# Patient Record
Sex: Female | Born: 1935
Health system: Southern US, Community
[De-identification: ages and names within clinical notes are randomized; demographics above are authoritative.]

## PROBLEM LIST (undated history)

## (undated) DIAGNOSIS — I10 Essential (primary) hypertension: Secondary | ICD-10-CM

## (undated) DIAGNOSIS — K222 Esophageal obstruction: Secondary | ICD-10-CM

## (undated) DIAGNOSIS — G459 Transient cerebral ischemic attack, unspecified: Secondary | ICD-10-CM

## (undated) DIAGNOSIS — F0390 Unspecified dementia without behavioral disturbance: Secondary | ICD-10-CM

## (undated) DIAGNOSIS — N289 Disorder of kidney and ureter, unspecified: Secondary | ICD-10-CM

## (undated) DIAGNOSIS — K219 Gastro-esophageal reflux disease without esophagitis: Secondary | ICD-10-CM

## (undated) DIAGNOSIS — E669 Obesity, unspecified: Secondary | ICD-10-CM

## (undated) DIAGNOSIS — M1990 Unspecified osteoarthritis, unspecified site: Secondary | ICD-10-CM

## (undated) DIAGNOSIS — E039 Hypothyroidism, unspecified: Secondary | ICD-10-CM

## (undated) DIAGNOSIS — M810 Age-related osteoporosis without current pathological fracture: Secondary | ICD-10-CM

## (undated) DIAGNOSIS — M549 Dorsalgia, unspecified: Secondary | ICD-10-CM

## (undated) DIAGNOSIS — K922 Gastrointestinal hemorrhage, unspecified: Secondary | ICD-10-CM

## (undated) DIAGNOSIS — E785 Hyperlipidemia, unspecified: Secondary | ICD-10-CM

## (undated) HISTORY — DX: Transient cerebral ischemic attack, unspecified: G45.9

## (undated) HISTORY — DX: Gastrointestinal hemorrhage, unspecified: K92.2

## (undated) HISTORY — DX: Esophageal obstruction: K22.2

## (undated) HISTORY — DX: Disorder of kidney and ureter, unspecified: N28.9

## (undated) HISTORY — DX: Hypothyroidism, unspecified: E03.9

## (undated) HISTORY — PX: CHOLECYSTECTOMY: SHX55

## (undated) HISTORY — DX: Unspecified osteoarthritis, unspecified site: M19.90

## (undated) HISTORY — DX: Obesity, unspecified: E66.9

## (undated) HISTORY — PX: CARDIAC CATHETERIZATION: SHX172

## (undated) HISTORY — PX: TONSILLECTOMY: SUR1361

## (undated) HISTORY — DX: Hyperlipidemia, unspecified: E78.5

## (undated) HISTORY — DX: Gastro-esophageal reflux disease without esophagitis: K21.9

## (undated) HISTORY — DX: Essential (primary) hypertension: I10

## (undated) HISTORY — DX: Age-related osteoporosis without current pathological fracture: M81.0

## (undated) HISTORY — PX: ABDOMINAL HYSTERECTOMY: SUR658

---

## 2000-07-07 ENCOUNTER — Other Ambulatory Visit: Admission: RE | Admit: 2000-07-07 | Discharge: 2000-07-07 | Payer: Self-pay | Admitting: Obstetrics and Gynecology

## 2000-09-20 ENCOUNTER — Ambulatory Visit (HOSPITAL_COMMUNITY): Admission: RE | Admit: 2000-09-20 | Discharge: 2000-09-20 | Payer: Self-pay | Admitting: Gastroenterology

## 2000-09-20 ENCOUNTER — Encounter (INDEPENDENT_AMBULATORY_CARE_PROVIDER_SITE_OTHER): Payer: Self-pay | Admitting: *Deleted

## 2002-11-12 ENCOUNTER — Encounter: Payer: Self-pay | Admitting: Family Medicine

## 2002-11-12 ENCOUNTER — Ambulatory Visit (HOSPITAL_COMMUNITY): Admission: RE | Admit: 2002-11-12 | Discharge: 2002-11-12 | Payer: Self-pay | Admitting: Family Medicine

## 2003-01-27 ENCOUNTER — Encounter: Payer: Self-pay | Admitting: Surgery

## 2003-01-30 ENCOUNTER — Ambulatory Visit (HOSPITAL_COMMUNITY): Admission: RE | Admit: 2003-01-30 | Discharge: 2003-01-31 | Payer: Self-pay | Admitting: Surgery

## 2003-01-30 ENCOUNTER — Encounter (INDEPENDENT_AMBULATORY_CARE_PROVIDER_SITE_OTHER): Payer: Self-pay | Admitting: Specialist

## 2003-12-16 ENCOUNTER — Observation Stay (HOSPITAL_COMMUNITY): Admission: EM | Admit: 2003-12-16 | Discharge: 2003-12-17 | Payer: Self-pay | Admitting: Emergency Medicine

## 2003-12-16 ENCOUNTER — Encounter (INDEPENDENT_AMBULATORY_CARE_PROVIDER_SITE_OTHER): Payer: Self-pay | Admitting: Specialist

## 2005-03-31 ENCOUNTER — Other Ambulatory Visit: Admission: RE | Admit: 2005-03-31 | Discharge: 2005-03-31 | Payer: Self-pay | Admitting: Family Medicine

## 2006-06-16 ENCOUNTER — Encounter: Payer: Self-pay | Admitting: Family Medicine

## 2006-08-29 ENCOUNTER — Encounter: Payer: Self-pay | Admitting: Family Medicine

## 2007-05-15 ENCOUNTER — Encounter: Payer: Self-pay | Admitting: Family Medicine

## 2007-05-17 ENCOUNTER — Encounter: Payer: Self-pay | Admitting: Family Medicine

## 2008-05-07 ENCOUNTER — Encounter: Payer: Self-pay | Admitting: Family Medicine

## 2008-06-05 ENCOUNTER — Encounter: Payer: Self-pay | Admitting: Family Medicine

## 2009-11-26 ENCOUNTER — Encounter: Payer: Self-pay | Admitting: Family Medicine

## 2009-11-30 ENCOUNTER — Encounter: Payer: Self-pay | Admitting: Family Medicine

## 2010-02-24 ENCOUNTER — Encounter: Payer: Self-pay | Admitting: Family Medicine

## 2010-05-27 ENCOUNTER — Ambulatory Visit: Payer: Self-pay | Admitting: Family Medicine

## 2010-05-27 DIAGNOSIS — E039 Hypothyroidism, unspecified: Secondary | ICD-10-CM

## 2010-05-27 DIAGNOSIS — E785 Hyperlipidemia, unspecified: Secondary | ICD-10-CM | POA: Insufficient documentation

## 2010-05-27 DIAGNOSIS — I10 Essential (primary) hypertension: Secondary | ICD-10-CM

## 2010-05-27 DIAGNOSIS — N289 Disorder of kidney and ureter, unspecified: Secondary | ICD-10-CM | POA: Insufficient documentation

## 2010-05-27 DIAGNOSIS — R32 Unspecified urinary incontinence: Secondary | ICD-10-CM | POA: Insufficient documentation

## 2010-05-27 DIAGNOSIS — Z87448 Personal history of other diseases of urinary system: Secondary | ICD-10-CM | POA: Insufficient documentation

## 2010-06-01 LAB — CONVERTED CEMR LAB
BUN: 28 mg/dL — ABNORMAL HIGH (ref 6–23)
Cholesterol: 231 mg/dL — ABNORMAL HIGH (ref 0–200)
Creatinine, Ser: 1.6 mg/dL — ABNORMAL HIGH (ref 0.4–1.2)
GFR calc non Af Amer: 33.04 mL/min (ref >60)
Glucose, Bld: 98 mg/dL (ref 70–99)
HDL: 47.9 mg/dL (ref >39.00)
Phosphorus: 3.4 mg/dL (ref 2.3–4.6)
Total CHOL/HDL Ratio: 5
Triglycerides: 228 mg/dL — ABNORMAL HIGH (ref 0.0–149.0)
VLDL: 45.6 mg/dL — ABNORMAL HIGH (ref 0.0–40.0)

## 2010-09-01 ENCOUNTER — Telehealth: Payer: Self-pay | Admitting: Family Medicine

## 2010-09-01 ENCOUNTER — Ambulatory Visit: Payer: Self-pay | Admitting: Family Medicine

## 2010-09-01 ENCOUNTER — Encounter (INDEPENDENT_AMBULATORY_CARE_PROVIDER_SITE_OTHER): Payer: Self-pay | Admitting: *Deleted

## 2010-09-01 DIAGNOSIS — K219 Gastro-esophageal reflux disease without esophagitis: Secondary | ICD-10-CM

## 2010-09-01 DIAGNOSIS — R1310 Dysphagia, unspecified: Secondary | ICD-10-CM | POA: Insufficient documentation

## 2010-09-01 LAB — CONVERTED CEMR LAB
Bacteria, UA: 0
Ketones, urine, test strip: NEGATIVE
Nitrite: NEGATIVE
RBC / HPF: 0
Urine crystals, microscopic: 0 /hpf
Urobilinogen, UA: 0.2
WBC, UA: 0 cells/hpf

## 2010-10-12 ENCOUNTER — Ambulatory Visit: Payer: Self-pay | Admitting: Gastroenterology

## 2010-10-12 ENCOUNTER — Telehealth: Payer: Self-pay | Admitting: Gastroenterology

## 2010-10-13 ENCOUNTER — Telehealth: Payer: Self-pay | Admitting: Gastroenterology

## 2010-10-13 ENCOUNTER — Ambulatory Visit (HOSPITAL_COMMUNITY)
Admission: RE | Admit: 2010-10-13 | Discharge: 2010-10-13 | Payer: Self-pay | Source: Home / Self Care | Admitting: Gastroenterology

## 2010-10-19 ENCOUNTER — Encounter (INDEPENDENT_AMBULATORY_CARE_PROVIDER_SITE_OTHER): Payer: Self-pay | Admitting: *Deleted

## 2010-10-20 ENCOUNTER — Ambulatory Visit: Payer: Self-pay | Admitting: Gastroenterology

## 2010-10-25 ENCOUNTER — Ambulatory Visit: Payer: Self-pay | Admitting: Gastroenterology

## 2010-10-25 DIAGNOSIS — K2970 Gastritis, unspecified, without bleeding: Secondary | ICD-10-CM | POA: Insufficient documentation

## 2010-10-25 DIAGNOSIS — K2990 Gastroduodenitis, unspecified, without bleeding: Secondary | ICD-10-CM

## 2010-11-23 ENCOUNTER — Ambulatory Visit
Admission: RE | Admit: 2010-11-23 | Discharge: 2010-11-23 | Payer: Self-pay | Source: Home / Self Care | Attending: Family Medicine | Admitting: Family Medicine

## 2010-11-24 LAB — CONVERTED CEMR LAB
AST: 23 units/L (ref 0–37)
BUN: 27 mg/dL — ABNORMAL HIGH (ref 6–23)
CO2: 29 meq/L (ref 19–32)
Calcium: 9.9 mg/dL (ref 8.4–10.5)
Cholesterol: 233 mg/dL — ABNORMAL HIGH (ref 0–200)
Creatinine, Ser: 1.8 mg/dL — ABNORMAL HIGH (ref 0.4–1.2)
Glucose, Bld: 99 mg/dL (ref 70–99)
HDL: 42.8 mg/dL (ref >39.00)
Total CHOL/HDL Ratio: 5
VLDL: 40.8 mg/dL — ABNORMAL HIGH (ref 0.0–40.0)

## 2010-12-01 ENCOUNTER — Ambulatory Visit
Admission: RE | Admit: 2010-12-01 | Discharge: 2010-12-01 | Payer: Self-pay | Source: Home / Self Care | Attending: Family Medicine | Admitting: Family Medicine

## 2010-12-11 ENCOUNTER — Encounter: Payer: Self-pay | Admitting: Family Medicine

## 2010-12-11 ENCOUNTER — Encounter: Payer: Self-pay | Admitting: Gastroenterology

## 2010-12-21 NOTE — Letter (Signed)
Summary: Abner Greenspan MD  Abner Greenspan MD   Imported By: Lanelle Bal 08/25/2010 10:54:31  _____________________________________________________________________  External Attachment:    Type:   Image     Comment:   External Document

## 2010-12-21 NOTE — Assessment & Plan Note (Signed)
Summary: FOLLOW UP/RI   Vital Signs:  Patient profile:   77 year old female Height:      61.75 inches Weight:      230.75 pounds BMI:     42.70 Temp:     98.1 degrees F oral Pulse rate:   64 / minute Pulse rhythm:   regular BP sitting:   134 / 84  (left arm) Cuff size:   large  Vitals Entered By: Lewanda Rife LPN (September 01, 2010 8:41 AM) CC: follow-up visit   History of Present Illness: here for f/u of lipids and HTN and renal insuff  last cr in july was 1.6 -- looks like baseline is 1.8 no idea what caused this  has never seen renal before  no hx of kidney infections  son has kidney stones    HTN stable with 134/84 today  lost 2 lb -- is working on it  eating what she wants   lipids - LDL 150s took simvastatin in the past -- had side eff of worse memory and more aches and pains   had her flu shot today  when she eats the food gets down 1/2 way and then stops  no hx of stroke eff of swallowing  has been there for several years  takes aciphex for gerd - that helps  still has a lot of burping and belching  Dr Yetta Flock may have done some x rays   hands sting and burn - ? arthritis   Allergies (verified): 1)  ! Simvastatin  Past History:  Past Medical History: Last updated: 06-08-10 Hypothyroidism Urinary incontinence HTN arthritis  GERD obesity renal insufficiency OP  ? mild cognitive impairment  ? if possible TIAs in the past -- on CT - unsure  upper GI bleed in past (poss from nsaids)  cardiac workup with cath several years ago   Past Surgical History: Last updated: 06/08/10 Cholecystectomy  Tonsillectomy Hysterectomy fx of a leg with chronic pain  colonoscopy in past  hyperlipidemia - was on simvastatin  in past - now on garlic   Family History: Last updated: 2010-06-08 Mother deceased Stroke                              Dementia Sister: arthritis Brother: living Dementia Brother Deceased Dementia nephew - cancer ? kind  nephew --  cancer ? kind  sister is Emeline [employer]  son died with drug problem and morbid obesity   Social History: Last updated: 2010/06/08 Retired worked at [employer] - she never smoked , - but exp to 2nd hand smoke (husb died of ca)  Widow/Widower currently lives with her boyfriend Never Smoked Alcohol use-no Drug use-no Regular exercise-no unable to exercise- in wheelchair / walker   Risk Factors: Exercise: no (06-08-10)  Risk Factors: Smoking Status: never (06-08-10)  Review of Systems General:  Denies fatigue, loss of appetite, and malaise. Eyes:  Denies blurring and eye irritation. CV:  Denies chest pain or discomfort, lightheadness, and palpitations. Resp:  Denies cough, shortness of breath, and wheezing. GI:  Denies abdominal pain, bloody stools, change in bowel habits, nausea, and vomiting. GU:  Denies dysuria, hematuria, urinary frequency, and urinary hesitancy. MS:  Complains of joint pain; denies joint redness and joint swelling. Derm:  Denies itching, lesion(s), poor wound healing, and rash. Neuro:  Denies numbness and tingling. Endo:  Denies excessive thirst and excessive urination. Heme:  Denies abnormal bruising and bleeding.  Physical  Exam  General:  overweight but generally well appearing  Head:  normocephalic, atraumatic, and no abnormalities observed.   Eyes:  vision grossly intact, pupils equal, pupils round, and pupils reactive to light.  no conjunctival pallor, injection or icterus  Mouth:  pharynx pink and moist.   Neck:  supple with full rom and no masses or thyromegally, no JVD or carotid bruit  Lungs:  Normal respiratory effort, chest expands symmetrically. Lungs are clear to auscultation, no crackles or wheezes. Heart:  Normal rate and regular rhythm. S1 and S2 normal without gallop, murmur, click, rub or other extra sounds. Abdomen:  Bowel sounds positive,abdomen soft and non-tender without masses, organomegaly or hernias noted. no renal bruits    Msk:  No deformity or scoliosis noted of thoracic or lumbar spine.  changes of OA in hands  Extremities:  No clubbing, cyanosis, edema, or deformity noted with normal full range of motion of all joints.   Neurologic:  sensation intact to light touch and DTRs symmetrical and normal.   Skin:  Intact without suspicious lesions or rashes Cervical Nodes:  No lymphadenopathy noted Inguinal Nodes:  No significant adenopathy Psych:  normal affect, talkative and pleasant    Impression & Recommendations:  Problem # 1:  DYSPHAGIA UNSPECIFIED (ICD-787.20) Assessment New with gerd not opt controlled by ppi ref to gi for likely endo  Orders: Gastroenterology Referral (GI) Prescription Created Electronically [phone])  Problem # 2:  GERD (ICD-530.81) Assessment: Deteriorated see above Her updated medication list for this problem includes:    Aciphex 20 Mg Tbec (Rabeprazole sodium) .Marland Kitchen... Take 1 tablet by mouth once a day  Orders: Gastroenterology Referral (GI) Prescription Created Electronically [phone])  Problem # 3:  HYPERTENSION (ICD-401.9) Assessment: Unchanged  this is fairly controlled  lab and f/u in 3 mo Her updated medication list for this problem includes:    Diovan Hct 320-25 Mg Tabs (Valsartan-hydrochlorothiazide) .Marland Kitchen... Take 1 tablet by mouth once a day    Metoprolol Tartrate 100 Mg Tabs (Metoprolol tartrate) .Marland Kitchen... Take one tablet by mouth twice a day    Spironolactone 25 Mg Tabs (Spironolactone) .Marland Kitchen... Take 1/2 tablet by mouth once a day  BP today: 134/84 Prior BP: 122/84 (05/27/2010)  Labs Reviewed: K+: 4.7 (05/27/2010) Creat: : 1.6 (05/27/2010)   Chol: 231 (05/27/2010)   HDL: 47.90 (05/27/2010)   TG: 228.0 (05/27/2010)  Orders: Prescription Created Electronically [phone])  Problem # 4:  RENAL INSUFFICIENCY (ICD-588.9)  baseline cr 1.8- is 1.6 now-  ? etiol poss multifactorial check ua will likely need renal eval after her gi eval  may need to change diuretics no  nsaids   Orders: Prescription Created Electronically [phone])  Complete Medication List: 1)  Diovan Hct 320-25 Mg Tabs (Valsartan-hydrochlorothiazide) .... Take 1 tablet by mouth once a day 2)  Aciphex 20 Mg Tbec (Rabeprazole sodium) .... Take 1 tablet by mouth once a day 3)  Metoprolol Tartrate 100 Mg Tabs (Metoprolol tartrate) .... Take one tablet by mouth twice a day 4)  Spironolactone 25 Mg Tabs (Spironolactone) .... Take 1/2 tablet by mouth once a day 5)  Levothyroxine Sodium 100 Mcg Tabs (Levothyroxine sodium) .... Take 1 tablet by mouth once a day 6)  Garlique 400 Mg Tbec (Garlic) .... Take one tablet  by mouth daily 7)  Multivitamins Tabs (Multiple vitamin) .... Take 1 tablet by mouth once a day 8)  Viactiv Multi-vitamin Chew (Multiple vitamins-calcium) .... Chew two daily 9)  Flax Seed Oil 1000 Mg Caps (Flaxseed (linseed)) .... One  capsule by mouth twice a day 10)  Vitamin B-12 2500 Mcg Subl (Cyanocobalamin) .... One subligual daily  Patient Instructions: 1)  you can raise your HDL (good cholesterol) by increasing exercise and eating omega 3 fatty acid supplement like fish oil or flax seed oil over the counter 2)  you can lower LDL (bad cholesterol) by limiting saturated fats in diet like red meat, fried foods, egg yolks, fatty breakfast meats, high fat dairy products and shellfish, and biscuts  3)  keep drinking lots of water  4)  please leave urine sample on the way out  5)  we will do GI referral at check out  6)  schedule fasting labs in 3 months lipid/ast/alt/renal / tsh 244.9, 272, renal insuff and then follow up  Prescriptions: ACIPHEX 20 MG TBEC (RABEPRAZOLE SODIUM) Take 1 tablet by mouth once a day  #90 x 3   Entered and Authorized by:   Judith Part MD   Signed by:   Judith Part MD on 09/01/2010   Method used:   Electronically to        [facility] [phone]* (retail)       [address]       [place], [place]  [address]        Ph: [phone] or [phone]       Fax: [phone]   RxID:   [id number] DIOVAN HCT 320-25 MG TABS (VALSARTAN-HYDROCHLOROTHIAZIDE) Take 1 tablet by mouth once a day  #90 x 3   Entered and Authorized by:   Judith Part MD   Signed by:   Judith Part MD on 09/01/2010   Method used:   Electronically to        [facility] [phone]* (retail)       [address]       [place], [place]  [address]       Ph: [phone] or [phone]       Fax: [phone]   RxID:   [id number]   Current Allergies (reviewed today): ! SIMVASTATIN   Immunization History:  Influenza Immunization History:    Influenza:  historical (08/18/2010)   Laboratory Results   Urine Tests  Date/Time Received: September 01, 2010 11:01 AM  Date/Time Reported: September 01, 2010 11:01 AM   Routine Urinalysis   Color: yellow Appearance: slightly hazy Glucose: negative   (Normal Range: Negative) Bilirubin: negative   (Normal Range: Negative) Ketone: negative   (Normal Range: Negative) Spec. Gravity: 1.015   (Normal Range: 1.003-1.035) Blood: trace-lysed   (Normal Range: Negative) pH: 6.0   (Normal Range: 5.0-8.0) Protein: negative   (Normal Range: Negative) Urobilinogen: 0.2   (Normal Range: 0-1) Nitrite: negative   (Normal Range: Negative) Leukocyte Esterace: negative   (Normal Range: Negative)  Urine Microscopic WBC/HPF: 0 RBC/HPF: 0 Bacteria/HPF: 0 Mucous/HPF: few Epithelial/HPF: 0-2 Crystals/HPF: 0 Casts/LPF: 0 Yeast/HPF: 0 Other: 0

## 2010-12-21 NOTE — Letter (Signed)
Summary: Dr.Greg [facility]  Dr[facility]Greg [facility]   Imported By: Beau Fanny 06/02/2010 08:33:43  _____________________________________________________________________  External Attachment:    Type:   Image     Comment:   External Document

## 2010-12-21 NOTE — Assessment & Plan Note (Signed)
Summary: NEW MEDICARE PT TO ESTABH/DLO   Vital Signs:  Patient profile:   77 year old female Height:      61.75 inches Weight:      232.75 pounds BMI:     43.07 Temp:     97.9 degrees F oral Pulse rate:   60 / minute Pulse rhythm:   regular BP sitting:   122 / 84  (left arm) Cuff size:   large  Vitals Entered By: Lewanda Rife LPN (May 27, 1609 11:13 AM) CC: New pt to establish Is Patient Diabetic? No Pain Assessment Patient in pain? yes     Location: knee Intensity: 10 Type: aching Onset of pain  Injured leg 2 years ago  Have you ever been in a relationship where you felt threatened, hurt or afraid?No   Does patient need assistance? Functional Status Cook/clean, Shopping, Social activities Ambulation Impaired:Risk for fall, Wheelchair Comments Pt's companion helps with work at home and pt does not drive so companion or family member takes pt where she needs to go. Pt is using walker today but has w/c and scooter at home.   History of Present Illness: used to see Dr Abner Greenspan in [place]/ [place]  last saw her in april (about every 3 months)   still lives in [place]   mam 1 year ago ? Td pneumovax - unsure when   HTN- very good control lately  meds are in good balance so far  does not really use a lot of salt   renal insuff - suspects is side eff from some meds or HTN? is careful about what she takes  drinks water - avoids dehydration  never had to see a kidney specialist  at one time had some utis - and now those are better    no diabetes   hypothyroid - take meds for that -- last labs ? when  thyroid has been stable for a while  no symptoms from this   OA which is significant -- in hands and feet  is not considering any kind of joint replacements  feet are the biggest problem  also knees and hips  had a fall 2 years ago -- broke her leg and has chronic pain from that (? tib fib)   OP also -- ? if has had one within 2 years   lives with her  boyfriend currently - he helps her out   memory is decreasing with age  Dr Rexene Edison did some testing and told her to drive , or medicate herself   needs chol check -- - is on garlic  ? if had side eff on simvastatin in the past  on garlique now        Preventive Screening-Counseling & Management  Alcohol-Tobacco     Smoking Status: never  Caffeine-Diet-Exercise     Does Patient Exercise: no      Drug Use:  no.    Allergies (verified): No Known Drug Allergies  Past History:  Past Medical History: Hypothyroidism Urinary incontinence HTN arthritis  GERD obesity renal insufficiency OP  ? mild cognitive impairment  ? if possible TIAs in the past -- on CT - unsure  upper GI bleed in past (poss from nsaids)  cardiac workup with cath several years ago   Past Surgical History: Cholecystectomy  Tonsillectomy Hysterectomy fx of a leg with chronic pain  colonoscopy in past  hyperlipidemia - was on simvastatin  in past - now on garlic   Family  History: Mother deceased Stroke                              Dementia Sister: arthritis Brother: living Dementia Brother Deceased Dementia nephew - cancer ? kind  nephew -- cancer ? kind  sister is Em[employer]  son died with drug problem and morbid obesity   Social History: Retired worked at [employer] - she never smoked , - but exp to 2nd hand smoke (husb died of ca)  Widow/Widower currently lives with her boyfriend Never Smoked Alcohol use-no Drug use-no Regular exercise-no unable to exercise- in wheelchair / walker  Smoking Status:  never Drug Use:  no Does Patient Exercise:  no  Review of Systems General:  Denies fatigue, fever, loss of appetite, and malaise. Eyes:  Denies blurring and eye irritation. CV:  Denies chest pain or discomfort, lightheadness, palpitations, and shortness of breath with exertion. Resp:  Denies cough, shortness of breath, and wheezing. GI:  Denies abdominal pain, bloody stools,  indigestion, loss of appetite, and nausea. GU:  Complains of incontinence; denies urinary frequency. MS:  Complains of joint pain and stiffness; denies joint redness and joint swelling. Derm:  Denies itching, lesion(s), poor wound healing, and rash. Neuro:  Denies numbness and tingling. Psych:  mood is fairly good. Endo:  Denies cold intolerance, excessive thirst, excessive urination, and heat intolerance. Heme:  Denies abnormal bruising and bleeding.  Physical Exam  General:  obese and walks slowly with walker  Head:  normocephalic, atraumatic, and no abnormalities observed.   Eyes:  vision grossly intact, pupils equal, pupils round, and pupils reactive to light.  no conjunctival pallor, injection or icterus  Ears:  scant cerumen R ear Nose:  no nasal discharge.   Mouth:  pharynx pink and moist.   Neck:  supple with full rom and no masses or thyromegally, no JVD or carotid bruit  Chest Wall:  No deformities, masses, or tenderness noted. Lungs:  Normal respiratory effort, chest expands symmetrically. Lungs are clear to auscultation, no crackles or wheezes. Heart:  Normal rate and regular rhythm. S1 and S2 normal without gallop, murmur, click, rub or other extra sounds. Abdomen:  sitting - soft and nt normal bowel sounds, no masses, and no guarding.   Msk:  No deformity or scoliosis noted of thoracic or lumbar spine.  severe pes planus some periph OA with poor rom knees unable to get on the table  Pulses:  R and L carotid,radial,femoral,dorsalis pedis and posterior tibial pulses are full and equal bilaterally Extremities:  No clubbing, cyanosis, edema, or deformity noted with normal full range of motion of all joints.   Neurologic:  sensation intact to light touch and DTRs symmetrical and normal.   Skin:  Intact without suspicious lesions or rashes Cervical Nodes:  No lymphadenopathy noted Inguinal Nodes:  No significant adenopathy Psych:  pleasant  confuses easily family member  provides much of her history   Impression & Recommendations:  Problem # 1:  HYPERLIPIDEMIA (ICD-272.4) Assessment New check lipids today rev low sat fat diet pt stopped simvastatin in past due to fear of side eff  update with results  f/u 3 mo  The following medications were removed from the medication list:    Simvastatin 80 Mg Tabs (Simvastatin) .Marland Kitchen... Take 1 tablet by mouth once a day  Orders: Venipuncture ([address]) TLB-Lipid Panel (80061-LIPID) TLB-Renal Function Panel (80069-RENAL) TLB-TSH (Thyroid Stimulating Hormone) (84443-TSH) TLB-ALT (SGPT) (84460-ALT) TLB-AST (SGOT) (84450-SGOT)  Problem # 2:  RENAL INSUFFICIENCY (ICD-588.9) Assessment: New sounds multifactorial send for last labs  disc avoidance of otc drugs/nsaids and imp of fluid intake Orders: Venipuncture ([address]) TLB-Lipid Panel (80061-LIPID) TLB-Renal Function Panel (80069-RENAL) TLB-TSH (Thyroid Stimulating Hormone) (84443-TSH) TLB-ALT (SGPT) (84460-ALT) TLB-AST (SGOT) (84450-SGOT)  Problem # 3:  HYPOTHYROIDISM (ICD-244.9) Assessment: New no clinical changes per pt  lab today and update  sent for old records  Her updated medication list for this problem includes:    Levothyroxine Sodium 100 Mcg Tabs (Levothyroxine sodium) .Marland Kitchen... Take 1 tablet by mouth once a day  Orders: Venipuncture ([address]) TLB-Lipid Panel (80061-LIPID) TLB-Renal Function Panel (80069-RENAL) TLB-TSH (Thyroid Stimulating Hormone) (84443-TSH) TLB-ALT (SGPT) (84460-ALT) TLB-AST (SGOT) (84450-SGOT)  Problem # 4:  HYPERTENSION (ICD-401.9) Assessment: New bp in good control on current regemen disc health habits  interested in some upper body exercise  lab today f/u 3 mo  Her updated medication list for this problem includes:    Diovan Hct 320-25 Mg Tabs (Valsartan-hydrochlorothiazide) .Marland Kitchen... Take 1 tablet by mouth once a day    Metoprolol Tartrate 100 Mg Tabs (Metoprolol tartrate) .Marland Kitchen... Take one tablet by mouth twice a day     Spironolactone 25 Mg Tabs (Spironolactone) .Marland Kitchen... Take 1/2 tablet by mouth once a day  Complete Medication List: 1)  Diovan Hct 320-25 Mg Tabs (Valsartan-hydrochlorothiazide) .... Take 1 tablet by mouth once a day 2)  Aciphex 20 Mg Tbec (Rabeprazole sodium) .... Take 1 tablet by mouth once a day 3)  Metoprolol Tartrate 100 Mg Tabs (Metoprolol tartrate) .... Take one tablet by mouth twice a day 4)  Spironolactone 25 Mg Tabs (Spironolactone) .... Take 1/2 tablet by mouth once a day 5)  Levothyroxine Sodium 100 Mcg Tabs (Levothyroxine sodium) .... Take 1 tablet by mouth once a day 6)  Garlique 400 Mg Tbec (Garlic) .... Take two tablets by mouth daily 7)  Multivitamins Tabs (Multiple vitamin) .... Take 1 tablet by mouth once a day 8)  Viactiv Multi-vitamin Chew (Multiple vitamins-calcium) .... Chew two daily  Patient Instructions: 1)  please send for old records from prev doctor -- last labs, dexa, ekg, imm records, last progress note, colonosc and endoscopy 2)  send for last [place] cardiology note and cath  3)  labs today  4)  follow up with me in about 3 months  Prescriptions: SPIRONOLACTONE 25 MG TABS (SPIRONOLACTONE) Take 1/2 tablet by mouth once a day  #45 x 3   Entered and Authorized by:   Judith Part MD   Signed by:   Judith Part MD on 05/27/2010   Method used:   Print then Give to Patient   RxID:   [id number] SPIRONOLACTONE 25 MG TABS (SPIRONOLACTONE) Take 1/2 tablet by mouth once a day  #45 x 3   Entered and Authorized by:   Judith Part MD   Signed by:   Judith Part MD on 05/27/2010   Method used:   [facility] to        [facility] [phone]* (retail)       [address]       [place], [place]  [address]       Ph: [phone] or [phone]       Fax: [phone]   RxID:   [id number] SPIRONOLACTONE 25 MG TABS (SPIRONOLACTONE) Take 1/2 tablet by mouth once a day  #15 x 0   Entered and Authorized by:   [employer]  Rose Fillers  MD   Signed by:   Judith Part MD on 05/27/2010   Method used:   [facility] to        [facility] [phone]* (retail)       [address]       [place], [place]  [address]       Ph: [phone] or [phone]       Fax: [phone]   RxID:   [id number]   Current Allergies (reviewed today): No known allergies

## 2010-12-21 NOTE — Letter (Signed)
Summary: EGD Instructions  [facility]  [address] [place], [place] [address]   Phone: [phone]  Fax: [phone]       Margaret Hicks    04-07-1932    MRN: [id number]       Procedure Day /Date:  Monday 10/25/2010     Arrival Time: 8:30 am     Procedure Time: 9:30 am     Location of Procedure:                    _ x _ [facility] (4th Floor)    PREPARATION FOR ENDOSCOPY   On Monday 12/5 THE DAY OF THE PROCEDURE:  1.   No solid foods, milk or milk products are allowed after midnight the night before your procedure.  2.   Do not drink anything colored red or purple.  Avoid juices with pulp.  No orange juice.  3.  You may drink clear liquids until 7:30 am, which is 2 hours before your procedure.                                                                                                CLEAR LIQUIDS INCLUDE: Water Jello Ice Popsicles Tea (sugar ok, no milk/cream) Powdered fruit flavored drinks Coffee (sugar ok, no milk/cream) Gatorade Juice: apple, white grape, white cranberry  Lemonade Clear bullion, consomm, broth Carbonated beverages (any kind) Strained chicken noodle soup Hard Candy   MEDICATION INSTRUCTIONS  Unless otherwise instructed, you should take regular prescription medications with a small sip of water as early as possible the morning of your procedure.  Take your Metoprolol the morning of procedure.    Additional medication instructions: Hold DiovanHCT and Spironolactone the morning of procedure.             OTHER INSTRUCTIONS  You will need a responsible adult at least 75 years of age to accompany you and drive you home.   This person must remain in the waiting room during your procedure.  Wear loose fitting clothing that is easily removed.  Leave jewelry and other valuables at home.  However, you may wish to bring a book to read or an iPod/MP3 player to listen to music as you wait for your procedure to  start.  Remove all body piercing jewelry and leave at home.  Total time from sign-in until discharge is approximately 2-3 hours.  You should go home directly after your procedure and rest.  You can resume normal activities the day after your procedure.  The day of your procedure you should not:   Drive   Make legal decisions   Operate machinery   Drink alcohol   Return to work  You will receive specific instructions about eating, activities and medications before you leave.    The above instructions have been reviewed and explained to me by   Wyona Almas RN  October 20, 2010 1:35 PM     I fully understand and can verbalize these instructions _____________________________ Date _________

## 2010-12-21 NOTE — Progress Notes (Signed)
Summary: Needs BA Swallow orders/Pt there now  Phone Note From Other Clinic   Caller: [facility] [phone] Glean Salen Call For: Dr Jarold Motto Summary of Call: Needs BA Swallow orders- Pt is there now. Unable to do until orders are received.Fax to [phone]  Initial call taken by: Leanor Kail [facility],  October 13, 2010 10:03 AM  Follow-up for Phone Call        order faxed yesterday  and today. Follow-up by: Harlow Mares CMA Duncan Dull),  October 13, 2010 10:54 AM

## 2010-12-21 NOTE — Assessment & Plan Note (Signed)
Summary: dysphagia and gerd...as.   History of Present Illness Visit Type: Initial Consult Primary GI MD: Margaret Bison MD FACP FAGA Primary Provider: Shepard Hicks Requesting Provider: Shepard Hicks Chief Complaint: Dysphagia and GERD History of Present Illness:   [name] patient of Margaret Hicks referred for evaluation of a globus sensation in her throat for least 2 years with some solid food dysphagia and upper pharyngeal-esophageal area. She's had no anorexia, weight loss, true reflux symptoms, or hepatobiliary complaints. She denies lower gastrointestinal problems, and had a negative colonoscopy with Dr. Ewing Hicks in 2001. She is on AcipHex 20 mg a day and daily aspirin. She's not had previous radiographs of her upper gut or endoscopy. She gives a vague history of previous GI bleeding from NSAID use.  She apparently has low back pain and is fairly immobile. She was with her son today and did not want to get out of her wheelchair for exam. Her chart suggests multiple infarct dementia. She's had previous cholecystectomy and hysterectomy. Family history is noncontributory.   GI Review of Systems    Reports acid reflux, belching, bloating, chest pain, dysphagia with solids, heartburn, nausea, and  vomiting.      Denies abdominal pain, dysphagia with liquids, loss of appetite, vomiting blood, weight loss, and  weight gain.        Denies anal fissure, black tarry stools, change in bowel habit, constipation, diarrhea, diverticulosis, fecal incontinence, heme positive stool, hemorrhoids, irritable bowel syndrome, jaundice, light color stool, liver problems, rectal bleeding, and  rectal pain.    Current Medications (verified): 1)  Diovan Hct 320-25 Mg Tabs (Valsartan-Hydrochlorothiazide) .... Take 1 Tablet By Mouth Once A Day 2)  Aciphex 20 Mg Tbec (Rabeprazole Sodium) .... Take 1 Tablet By Mouth Once A Day 3)  Metoprolol Tartrate 100 Mg Tabs (Metoprolol Tartrate) .... Take One  Tablet By Mouth Twice A Day 4)  Spironolactone 25 Mg Tabs (Spironolactone) .... Take 1/2 Tablet By Mouth Once A Day 5)  Levothyroxine Sodium 100 Mcg Tabs (Levothyroxine Sodium) .... Take 1 Tablet By Mouth Once A Day 6)  Garlique 400 Mg Tbec (Garlic) .... Take One Tablet  By Mouth Daily 7)  Multivitamins   Tabs (Multiple Vitamin) .... Take 1 Tablet By Mouth Once A Day 8)  Viactiv Multi-Vitamin  Chew (Multiple Vitamins-Calcium) .... Chew Two Daily 9)  Flax Seed Oil 1000 Mg Caps (Flaxseed (Linseed)) .... One Capsule By Mouth Twice A Day 10)  Vitamin B-12 2500 Mcg Subl (Cyanocobalamin) .... One Subligual Daily  Allergies (verified): 1)  ! Simvastatin  Past History:  Past medical, surgical, family and social histories (including risk factors) reviewed for relevance to current acute and chronic problems.  Past Medical History: Reviewed history from 05/27/2010 and no changes required. Hypothyroidism Urinary incontinence HTN arthritis  GERD obesity renal insufficiency OP  ? mild cognitive impairment  ? if possible TIAs in the past -- on CT - unsure  upper GI bleed in past (poss from nsaids)  cardiac workup with cath several years ago   Past Surgical History: Reviewed history from 05/27/2010 and no changes required. Cholecystectomy  Tonsillectomy Hysterectomy fx of a leg with chronic pain  colonoscopy in past  hyperlipidemia - was on simvastatin  in past - now on garlic   Family History: Reviewed history from 05/27/2010 and no changes required. Mother deceased Stroke  Dementia Sister: arthritis Brother: living Dementia Brother Deceased Dementia nephew - cancer ? kind  nephew -- cancer ? kind  sister is Margaret Hicks  son died with drug problem and morbid obesity  No FH of Colon Cancer:  Social History: Reviewed history from 05/27/2010 and no changes required. Retired worked at [facility] - she never smoked , - but exp to 2nd hand smoke (husb  died of ca)  Widow/Widower currently lives with her boyfriend Never Smoked Alcohol use-no Drug use-no Regular exercise-no unable to exercise- in wheelchair / walker   Review of Systems       The patient complains of hoarseness, dyspnea on exertion, and peripheral edema.  The patient denies anorexia, fever, weight loss, weight gain, vision loss, decreased hearing, chest pain, syncope, prolonged cough, headaches, hemoptysis, abdominal pain, melena, hematochezia, severe indigestion/heartburn, hematuria, incontinence, genital sores, muscle weakness, suspicious skin lesions, transient blindness, difficulty walking, depression, unusual weight change, abnormal bleeding, enlarged lymph nodes, angioedema, breast masses, and testicular masses.    Vital Signs:  Patient profile:   77 year old female Height:      61.75 inches Weight:      226 pounds BMI:     41.82 BSA:     2.01 Pulse rate:   62 / minute Pulse rhythm:   regular BP sitting:   128 / 80  (left arm)  Vitals Entered By: Margaret Hicks CMA Margaret Hicks) (October 12, 2010 8:27 AM)  Physical Exam  Hicks:  Well developed, well nourished, no acute distress.obese.   Head:  Normocephalic and atraumatic. Eyes:  PERRLA, no icterus.exam deferred to patient's ophthalmologist.   Mouth:  No deformity or lesions, dentition normal. Neck:  Supple; no masses or thyromegaly. Lungs:  Clear throughout to auscultation. Heart:  Regular rate and rhythm; no murmurs, rubs,  or bruits. Abdomen:  exam performed in her wheelchair but there were no areas of tenderness. Bowel sounds were normal. Neurologic:  Alert and  oriented x4;  grossly normal neurologically. Cervical Nodes:  No significant cervical adenopathy. Psych:  Alert and cooperative. Normal mood and affect.   Impression & Recommendations:  Problem # 1:  DYSPHAGIA UNSPECIFIED (ICD-787.20) Assessment Deteriorated Probable chronic GERD with peptic stricture or esophagitis---rule out Zenker's  diverticulum or other structural lesions. Barium swallow has been ordered before we proceed with endoscopic exam.For now we will continue AcipHex therapy. Orders: Barium Swallow (Barium Swallow)  Problem # 2:  HYPERTENSION (ICD-401.9) Assessment: Improved blood pressure 128/80-continue other meds per primary care  Patient Instructions: 1)  You have been scheduled for a Barium Swallow.  2)  Copy sent to : Marne Tower,MD 3)  The medication list was reviewed and reconciled.  All changed / newly prescribed medications were explained.  A complete medication list was provided to the patient / caregiver.

## 2010-12-21 NOTE — Letter (Signed)
Summary: [facility]   Imported By: Beau Fanny 06/02/2010 08:18:55  _____________________________________________________________________  External Attachment:    Type:   Image     Comment:   External Document

## 2010-12-21 NOTE — Miscellaneous (Signed)
Summary: Orders Update-Clotest  Clinical Lists Changes  Problems: Added new problem of GASTRITIS (ICD-535.50) Orders: Added new Test order of TLB-H Pylori Screen Gastric Biopsy (83013-CLOTEST) - Signed 

## 2010-12-21 NOTE — Progress Notes (Signed)
Summary: needs written scripts  Phone Note Call from Patient Call back at Home Phone [phone]   Caller: Daughter- [name] Call For: Judith Part MD Summary of Call: Daughter needs written scripts for the diovan and aciphex so that they can mail them off to [facility]. I already called [facility]  and canceled the rx there.  Initial call taken by: Melody Comas,  September 01, 2010 1:55 PM  Follow-up for Phone Call        printed in put in nurse in box for pickup    Follow-up by: Judith Part MD,  September 01, 2010 2:06 PM  Additional Follow-up for Phone Call Additional follow up Details #1::        Paulat notified as instructed by telephone. Prescriptions are waiting at front desk for pick up. Lewanda Rife LPN  September 01, 2010 2:11 PM     Prescriptions: ACIPHEX 20 MG TBEC (RABEPRAZOLE SODIUM) Take 1 tablet by mouth once a day  #90 x 3   Entered and Authorized by:   Judith Part MD   Signed by:   Judith Part MD on 09/01/2010   Method used:   Print then Give to Patient   RxID:   [id number] DIOVAN HCT 320-25 MG TABS (VALSARTAN-HYDROCHLOROTHIAZIDE) Take 1 tablet by mouth once a day  #90 x 3   Entered and Authorized by:   Judith Part MD   Signed by:   Judith Part MD on 09/01/2010   Method used:   Print then Give to Patient   RxID:   [id number]

## 2010-12-21 NOTE — Letter (Signed)
Summary: New Patient letter  [facility]  [address] [place], [place] [address]   Phone: [phone]  Fax: [phone]       09/01/2010 MRN: [id number]  [facility] [address] [place], [place]  [address]  Dear Margaret Hicks,  Welcome to the Gastroenterology Division at [facility].    You are scheduled to see Dr. Jarold Motto on 10/12/2010 at 8:30AM on the 3rd floor at [facility], [address]  We ask that you try to arrive at our office 15 minutes prior to your appointment time to allow for check-in.  We would like you to complete the enclosed self-administered evaluation form prior to your visit and bring it with you on the day of your appointment.  We will review it with you.  Also, please bring a complete list of all your medications or, if you prefer, bring the medication bottles and we will list them.  Please bring your insurance card so that we may make a copy of it.  If your insurance requires a referral to see a specialist, please bring your referral form from your primary care physician.  Co-payments are due at the time of your visit and may be paid by cash, check or credit card.     Your office visit will consist of a consult with your physician (includes a physical exam), any laboratory testing he/she may order, scheduling of any necessary diagnostic testing (e.g. x-ray, ultrasound, CT-scan), and scheduling of a procedure (e.g. Endoscopy, Colonoscopy) if required.  Please allow enough time on your schedule to allow for any/all of these possibilities.    If you cannot keep your appointment, please call [phone] to cancel or reschedule prior to your appointment date.  This allows [place] the opportunity to schedule an appointment for another patient in need of care.  If you do not cancel or reschedule by 5 p.m. the business day prior to your appointment date, you will be charged a $50.00 late cancellation/no-show fee.    Thank you for choosing [facility] for your medical needs.  We appreciate the opportunity to care for you.  Please visit [place] at our website  to learn more about our practice.                     Sincerely,                                                             The Gastroenterology Division

## 2010-12-21 NOTE — Progress Notes (Signed)
Summary: Questions about Barrium Swallow  Phone Note Call from Patient   Caller: daughter in law----Paula  [phone] Call For: Dr. Jarold Motto Reason for Call: Talk to Nurse Summary of Call: Has some questions about her barrium swallow that is sch'd tomorrow Initial call taken by: Karna Christmas,  October 12, 2010 2:56 PM  Follow-up for Phone Call        daughter in law would like to know why Barrium Swallow vs EGD? She states that the pt has had alot of radition and if possible they would like to just have EGD not do a barrium Swallow first Follow-up by: Harlow Mares CMA Duncan Dull),  October 12, 2010 3:04 PM  Additional Follow-up for Phone Call Additional follow up Details #1::        r/o ZENKER'S... Additional Follow-up by: Mardella Layman MD FACG,  October 12, 2010 3:49 PM    Additional Follow-up for Phone Call Additional follow up Details #2::    explained to paula that a barrium swallow needs to be done to r/o zenkers before an egd can be done. If she has a zemkers and Dr. Jarold Motto does an Egd he could perforate her esophagus. The barrium swallow if needed. [name] states that she disagrees with that and that she wants me to cx the test she will not be taking her mother in law. She thinks even with the limited exposure to radiation is not needed. She says that her family will discuss this oever the holidays and call back if they decide that the patient will be doing the test. I have called to cx the barrium swallow and they will call back.   I will also forward this not to Dr. Milinda Antis. Follow-up by: Harlow Mares CMA Duncan Dull),  October 12, 2010 3:58 PM

## 2010-12-21 NOTE — Letter (Signed)
Summary: Paperwork for Wheelchair from [employer] from [employer]   Imported By: Lanelle Bal 08/25/2010 10:53:17  _____________________________________________________________________  External Attachment:    Type:   Image     Comment:   External Document

## 2010-12-21 NOTE — Procedures (Signed)
Summary: Upper Endoscopy w/DIL  Patient: Margaret Hicks Note: All result statuses are Final unless otherwise noted.  Tests: (1) Upper Endoscopy w/DIL (UED)  UED Upper Endoscopy w/DIL                             DONE     [facility]     [address]     [place], [place]  [address]           ENDOSCOPY PROCEDURE REPORT           PATIENT:  [name], [name]  MR#:  [id number]     BIRTHDATE:  09-08-1932, 78 yrs. old  GENDER:  female           ENDOSCOPIST:  Vania Rea. Jarold Motto, MD, Clementeen Graham     ASSISTANT:           PROCEDURE DATE:  10/25/2010     PROCEDURE:  EGD with biopsy, Elease Hashimoto Dilation of the Esophagus     ASA CLASS:  Class II     INDICATIONS:  1) dysphagia NEGATIVE BARIUM SWALLOW FOR ANY     OBSTRUCTION OR DYSMOTILITY.           MEDICATIONS:   Fentanyl 25 mcg IV, Versed 2 mg IV     TOPICAL ANESTHETIC:  Exactacain Spray           DESCRIPTION OF PROCEDURE:   After the risks benefits and     alternatives of the procedure were thoroughly explained, informed     consent was obtained.  The [id number] endoscope was     introduced through the mouth and advanced to the second portion of     the duodenum, without limitations.  The instrument was slowly     withdrawn as the mucosa was carefully examined.     <<PROCEDUREIMAGES>>           Moderate gastritis was found in the antrum. CLO BX. DONE.SEE     PICTURES,NO ULCERATION OE EROSIONS,OR HEME.  A Schatzki's ring was     found at the gastroesophageal junction. Maloney dilation was     performed. 61F MALONEY DILATOR PASSED WITH MINIMAL RESITANCE,NO     PAIN OR HEME.  The esophagus and gastroesophageal junction were     completely normal in appearance.  Normal duodenal folds were     noted.    Dilation was then performed at the           COMPLICATIONS:  None           ENDOSCOPIC IMPRESSION:     1) Moderate gastritis in the antrum     2) Schatzki's ring at the gastroesophageal junction     3) Normal esophagus     4) Normal  duodenal folds     RECOMMENDATIONS:     1) Repeat dilation as needed.     2) await biopsy results     3) post dilation instructions           REPEAT EXAM:  No           ______________________________     Vania Rea. Jarold Motto, MD, Clementeen Graham           CC:  Marne A. Milinda Antis, M.D.           n.     eSIGNED:   Vania Rea. Patterson at 10/25/2010 10:04 AM  [place], [place], [id number]  Note: An exclamation mark (!) indicates a result that was not dispersed into the flowsheet. Document Creation Date: 10/25/2010 10:04 AM _______________________________________________________________________  (1) Order result status: Final Collection or observation date-time: 10/25/2010 09:56 Requested date-time:  Receipt date-time:  Reported date-time:  Referring Physician:   Ordering Physician: Sheryn Bison [phone]) Specimen Source:  Source: Launa Grill Order Number: [id number] Lab site:

## 2010-12-21 NOTE — Letter (Signed)
Summary: Patient Questionnaire  Patient Questionnaire   Imported By: Beau Fanny 05/27/2010 14:47:40  _____________________________________________________________________  External Attachment:    Type:   Image     Comment:   External Document

## 2010-12-21 NOTE — Letter (Signed)
Summary: Abner Greenspan MD  Abner Greenspan MD   Imported By: Lanelle Bal 08/25/2010 10:55:15  _____________________________________________________________________  External Attachment:    Type:   Image     Comment:   External Document

## 2010-12-21 NOTE — Miscellaneous (Signed)
Summary: [facility] Previsit/prep  Clinical Lists Changes  Observations: Added new observation of ALLERGY REV: Done (10/20/2010 12:52)

## 2010-12-23 ENCOUNTER — Encounter: Payer: Self-pay | Admitting: Family Medicine

## 2010-12-23 NOTE — Assessment & Plan Note (Signed)
Summary: ROA 3 MTHS CYD   Vital Signs:  Patient profile:   77 year old female Height:      61.75 inches Weight:      229.50 pounds BMI:     42.47 Temp:     97.9 degrees F oral Pulse rate:   64 / minute Pulse rhythm:   regular BP sitting:   122 / 80  (left arm) Cuff size:   large  Vitals Entered By: Lewanda Rife LPN (December 01, 2010 9:02 AM) CC: three month f/u   History of Present Illness: here for f/u of HTN and lipids and renal  insuff  is feeling better overall  chronic problems with feet and legs   saw GI for dysphagia and gerd had EGD for that -- with dilatation -- some improvement  aciphex is expensive -other options    HTN in good control with 122/80 today  lipids are high with trig 204 and DHL 40 andLDL 161 has been watching diet a whole lot more  stopped zocor in past for fear of side eff  cr is 1.8 and bun 27 today- that is up drinks about 3 glasses of water per day  clear ua last time  ? baseline 1.6     Allergies: 1)  ! Simvastatin  Past History:  Past Medical History: Last updated: 05/27/2010 Hypothyroidism Urinary incontinence HTN arthritis  GERD obesity renal insufficiency OP  ? mild cognitive impairment  ? if possible TIAs in the past -- on CT - unsure  upper GI bleed in past (poss from nsaids)  cardiac workup with cath several years ago   Past Surgical History: Last updated: 05/27/2010 Cholecystectomy  Tonsillectomy Hysterectomy fx of a leg with chronic pain  colonoscopy in past  hyperlipidemia - was on simvastatin  in past - now on garlic   Family History: Last updated: 2010/11/10 Mother deceased Stroke                              Dementia Sister: arthritis Brother: living Dementia Brother Deceased Dementia nephew - cancer ? kind  nephew -- cancer ? kind  sister is Emeline General  son died with drug problem and morbid obesity  No FH of Colon Cancer:  Social History: Last updated: 05/27/2010 Retired worked at  [employer] - she never smoked , - but exp to 2nd hand smoke (husb died of ca)  Widow/Widower currently lives with her boyfriend Never Smoked Alcohol use-no Drug use-no Regular exercise-no unable to exercise- in wheelchair / walker   Risk Factors: Exercise: no (05/27/2010)  Risk Factors: Smoking Status: never (05/27/2010)  Review of Systems General:  Denies fatigue, loss of appetite, and malaise. Eyes:  Denies blurring and eye irritation. CV:  Denies chest pain or discomfort, lightheadness, palpitations, and shortness of breath with exertion. Resp:  Denies cough, shortness of breath, and wheezing. GI:  Denies abdominal pain, indigestion, and nausea. GU:  Denies dysuria and urinary frequency. MS:  Complains of joint pain, muscle, and stiffness. Derm:  Denies itching, lesion(s), poor wound healing, and rash. Neuro:  Denies numbness and tingling. Endo:  Denies cold intolerance, excessive thirst, excessive urination, and heat intolerance. Heme:  Denies abnormal bruising and bleeding.  Physical Exam  General:  overwt elderly female in no distress  Head:  normocephalic, atraumatic, and no abnormalities observed.   Eyes:  vision grossly intact, pupils equal, pupils round, and pupils reactive to light.  no conjunctival pallor,  injection or icterus  Mouth:  pharynx pink and moist.   Neck:  supple with full rom and no masses or thyromegally, no JVD or carotid bruit  Lungs:  Normal respiratory effort, chest expands symmetrically. Lungs are clear to auscultation, no crackles or wheezes. Heart:  Normal rate and regular rhythm. S1 and S2 normal without gallop, murmur, click, rub or other extra sounds. Abdomen:  Bowel sounds positive,abdomen soft and non-tender without masses, organomegaly or hernias noted. no renal bruits  Msk:  severe ped planus with pronation  Pulses:  nl pedal pulses  Extremities:  spider veins diffusely no edema  Neurologic:  sensation intact to light touch, gait  normal, and DTRs symmetrical and normal.   Skin:  Intact without suspicious lesions or rashes Cervical Nodes:  No lymphadenopathy noted Inguinal Nodes:  No significant adenopathy Psych:  normal affect, talkative and pleasant    Impression & Recommendations:  Problem # 1:  DYSPHAGIA UNSPECIFIED (ICD-787.20) Assessment Improved this is improved after dilatation  will continue to monitor  Problem # 2:  GASTRITIS (ICD-535.50) Assessment: Unchanged wants to know less $$ opt to aciphex will call ins and let me know  hx  of GI bleed in past so need to continue ppi indefinitely no nsaids Her updated medication list for this problem includes:    Aciphex 20 Mg Tbec (Rabeprazole sodium) .Marland Kitchen... Take 1 tablet by mouth once a day  Problem # 3:  HYPERTENSION (ICD-401.9) Assessment: Unchanged  this is well controlled ? if will have to change some meds in light of renal insuff Her updated medication list for this problem includes:    Diovan Hct 320-25 Mg Tabs (Valsartan-hydrochlorothiazide) .Marland Kitchen... Take 1 tablet by mouth once a day    Metoprolol Tartrate 100 Mg Tabs (Metoprolol tartrate) .Marland Kitchen... Take one tablet by mouth twice a day    Spironolactone 25 Mg Tabs (Spironolactone) .Marland Kitchen... Take 1/2 tablet by mouth once a day  BP today: 122/80 Prior BP: 128/80 (10/12/2010)  Labs Reviewed: K+: 4.8 (11/23/2010) Creat: : 1.8 (11/23/2010)   Chol: 233 (11/23/2010)   HDL: 42.80 (11/23/2010)   TG: 204.0 (11/23/2010)  Problem # 4:  RENAL INSUFFICIENCY (ICD-588.9) Assessment: Deteriorated this is worse longstanding but worsening  ref to renal  expl imp of low salt diet and inc water intake Orders: Nephrology Referral (Nephro)  Problem # 5:  HYPERLIPIDEMIA (ICD-272.4) Assessment: Deteriorated this is worse despite good diet  will disc whether she would be open to another statin  rev labs  will consider it and call back  Complete Medication List: 1)  Diovan Hct 320-25 Mg Tabs  (Valsartan-hydrochlorothiazide) .... Take 1 tablet by mouth once a day 2)  Aciphex 20 Mg Tbec (Rabeprazole sodium) .... Take 1 tablet by mouth once a day 3)  Metoprolol Tartrate 100 Mg Tabs (Metoprolol tartrate) .... Take one tablet by mouth twice a day 4)  Spironolactone 25 Mg Tabs (Spironolactone) .... Take 1/2 tablet by mouth once a day 5)  Levothyroxine Sodium 100 Mcg Tabs (Levothyroxine sodium) .... Take 1 tablet by mouth once a day 6)  Garlique 400 Mg Tbec (Garlic) .... Take one tablet  by mouth daily 7)  Multivitamins Tabs (Multiple vitamin) .... Take 1 tablet by mouth once a day 8)  Viactiv Multi-vitamin Chew (Multiple vitamins-calcium) .... Chew two daily 9)  Flax Seed Oil 1000 Mg Caps (Flaxseed (linseed)) .... One capsule by mouth twice a day 10)  Vitamin B-12 2500 Mcg Subl (Cyanocobalamin) .... One subligual daily  Patient Instructions:  1)  call your insurance and see what proton pump inhibitors are covered under your insurance -- let me know what would be affordible and we will switch you  2)  think about trying another statin med for cholesterol -- you have the type of high cholesterol that will not come down without medication  3)  (lipitor, crestor, pravachol - are options) -- we would stop it immediately if any side effects  4)  keep eating a healthy diet - good job with that  5)  we will do referral for kidney doctor at check out  6)  follow up with me in about 6 months  Prescriptions: LEVOTHYROXINE SODIUM 100 MCG TABS (LEVOTHYROXINE SODIUM) Take 1 tablet by mouth once a day  #90 x 3   Entered and Authorized by:   Judith Part MD   Signed by:   Judith Part MD on 12/01/2010   Method used:   Printed then faxed to ...       [facility] [phone]* (retail)       [address]       [place], [place]  [address]       Ph: [phone] or [phone]       Fax: [phone]   RxID:   [id number] SPIRONOLACTONE 25 MG TABS (SPIRONOLACTONE)  Take 1/2 tablet by mouth once a day  #45 x 3   Entered and Authorized by:   Judith Part MD   Signed by:   Judith Part MD on 12/01/2010   Method used:   Printed then faxed to ...       [facility] [phone]* (retail)       [address]       [place], [place]  [address]       Ph: [phone] or [phone]       Fax: [phone]   RxID:   [id number] METOPROLOL TARTRATE 100 MG TABS (METOPROLOL TARTRATE) take one tablet by mouth twice a day  #180 x 3   Entered and Authorized by:   Judith Part MD   Signed by:   Judith Part MD on 12/01/2010   Method used:   Printed then faxed to ...       [facility] [phone]* (retail)       [address]       [place], [place]  [address]       Ph: [phone] or [phone]       Fax: [phone]   RxID:   [id number] DIOVAN HCT 320-25 MG TABS (VALSARTAN-HYDROCHLOROTHIAZIDE) Take 1 tablet by mouth once a day  #90 x 3   Entered and Authorized by:   Judith Part MD   Signed by:   Judith Part MD on 12/01/2010   Method used:   Printed then faxed to ...       [facility] [phone]* (retail)       [address]       [place], [place]  [address]       Ph: [phone] or [phone]       Fax: [phone]   RxID:   [id number]  Rxs faxed to [facility] [phone] as instructed. Paula notified as instructed by  telephone. Lewanda Rife LPN  December 01, 2010 3:29 PM   Orders Added: 1)  Nephrology Referral [Nephro] 2)  Est. Patient Level IV [[address]]    Current Allergies (reviewed today): ! SIMVASTATIN

## 2011-01-06 NOTE — Letter (Signed)
Summary: Dr Cherylann Ratel Evaluation  Dr Cherylann Ratel Evaluation   Imported By: Kassie Mends 12/29/2010 11:49:50  _____________________________________________________________________  External Attachment:    Type:   Image     Comment:   External Document

## 2011-01-26 ENCOUNTER — Ambulatory Visit (INDEPENDENT_AMBULATORY_CARE_PROVIDER_SITE_OTHER): Payer: Medicare Other | Admitting: Family Medicine

## 2011-01-26 ENCOUNTER — Encounter: Payer: Self-pay | Admitting: Family Medicine

## 2011-01-26 DIAGNOSIS — R1310 Dysphagia, unspecified: Secondary | ICD-10-CM

## 2011-01-26 DIAGNOSIS — K644 Residual hemorrhoidal skin tags: Secondary | ICD-10-CM

## 2011-01-26 DIAGNOSIS — K219 Gastro-esophageal reflux disease without esophagitis: Secondary | ICD-10-CM

## 2011-02-01 NOTE — Assessment & Plan Note (Signed)
Summary: hemorrhoid   Vital Signs:  Patient profile:   77 year old female Height:      61.75 inches Weight:      221.25 pounds BMI:     40.94 Temp:     97.9 degrees F oral Pulse rate:   64 / minute Pulse rhythm:   regular BP sitting:   108 / 66  (left arm) Cuff size:   large  Vitals Entered By: Lewanda Rife LPN (January 26, 1307 10:46 AM) CC: hemorrhoids, thinks external; pt constipated and having dark bleeding when has BM or pushes down. also problems swallowing.   History of Present Illness: here for possible hemorroid problem  may have some external ones (has had in the past )  some bulging and bleeding a bit  a little pain when she has a bowel movement  darker color blood - and not a lot   lately has been straining more  more constipation last 2 weeks --some discomfort occas in LLQ is on low salt higher fiber diet - and lost 8-10 lb  family is making meals and managing snacks  may not be getting enough water   also some swallowing problems feels like she had some pressure in throat -- hard to get food down  had a dilatation 3-6 mo ago   aciphex is too expensive so changed to prilosec -- that may not work as well  waiting on ins card to see what is covered    colonoscopy-- thinks that was a long time ago  ? if over 10 years  ithinks in [place]     Allergies: 1)  ! Simvastatin  Past History:  Past Medical History: Last updated: 05/27/2010 Hypothyroidism Urinary incontinence HTN arthritis  GERD obesity renal insufficiency OP  ? mild cognitive impairment  ? if possible TIAs in the past -- on CT - unsure  upper GI bleed in past (poss from nsaids)  cardiac workup with cath several years ago   Past Surgical History: Last updated: 05/27/2010 Cholecystectomy  Tonsillectomy Hysterectomy fx of a leg with chronic pain  colonoscopy in past  hyperlipidemia - was on simvastatin  in past - now on garlic   Family History: Last updated: 11/03/10 Mother  deceased Stroke                              Dementia Sister: arthritis Brother: living Dementia Brother Deceased Dementia nephew - cancer ? kind  nephew -- cancer ? kind  sister is Emeline General  son died with drug problem and morbid obesity  No FH of Colon Cancer:  Social History: Last updated: 05/27/2010 Retired worked at [employer] - she never smoked , - but exp to 2nd hand smoke (husb died of ca)  Widow/Widower currently lives with her boyfriend Never Smoked Alcohol use-no Drug use-no Regular exercise-no unable to exercise- in wheelchair / walker   Risk Factors: Exercise: no (05/27/2010)  Risk Factors: Smoking Status: never (05/27/2010)  Review of Systems General:  Denies chills, fatigue, fever, loss of appetite, and malaise. Eyes:  Denies blurring and eye irritation. ENT:  Complains of difficulty swallowing. CV:  Denies chest pain or discomfort, lightheadness, and palpitations. Resp:  Denies cough and shortness of breath. GI:  Complains of abdominal pain, bloody stools, change in bowel habits, constipation, gas, and indigestion; denies dark tarry stools, diarrhea, loss of appetite, nausea, and vomiting. GU:  Denies hematuria and urinary frequency. MS:  Denies  joint pain. Derm:  Denies itching, lesion(s), poor wound healing, and rash. Neuro:  Denies numbness and tingling. Heme:  Denies abnormal bruising.  Physical Exam  General:  elderly female in no distress confuses easily Head:  normocephalic, atraumatic, and no abnormalities observed.   Eyes:  vision grossly intact, pupils equal, pupils round, and pupils reactive to light.  no conjunctival pallor, injection or icterus  Mouth:  pharynx pink and moist.   Neck:  supple with full rom and no masses or thyromegally, no JVD or carotid bruit  Chest Wall:  No deformities, masses, or tenderness noted. Lungs:  Normal respiratory effort, chest expands symmetrically. Lungs are clear to auscultation, no crackles or  wheezes. Heart:  Normal rate and regular rhythm. S1 and S2 normal without gallop, murmur, click, rub or other extra sounds. Abdomen:  mild LLQ tenderness without rebound or mass soft, normal bowel sounds, no hepatomegaly, and no splenomegaly.   Rectal:  several moderate sized non thrombosed ext hemorroids  slt tender no mass stool heme neg  Extremities:  spider veins diffusely no edema  Neurologic:  sensation intact to light touch, gait normal, and DTRs symmetrical and normal.   Skin:  Intact without suspicious lesions or rashes no pallor or jaundice  nl color and turgor  Cervical Nodes:  No lymphadenopathy noted Inguinal Nodes:  No significant adenopathy Psych:  pleasant  eaily confused  here with supportive family   Impression & Recommendations:  Problem # 1:  DYSPHAGIA UNSPECIFIED (ICD-787.20) Assessment Deteriorated with worsened gerd - from change of ppi for cost ref to GI for eval (did have EGD and dilatation previously )  Orders: Gastroenterology Referral (GI)  Problem # 2:  EXTERNAL HEMORRHOIDS (ICD-455.3) Assessment: New  worsened by constipatoin and straining  adv inc water with her fiber stool softener as needed  anusol hc cream as needed  can disc colonoscopy rec with GI  Orders: Prescription Created Electronically [id number])  Complete Medication List: 1)  Metoprolol Tartrate 100 Mg Tabs (Metoprolol tartrate) .... Take one tablet by mouth twice a day 2)  Spironolactone 25 Mg Tabs (Spironolactone) .... Take 1/2 tablet by mouth once a day 3)  Levothyroxine Sodium 100 Mcg Tabs (Levothyroxine sodium) .... Take 1 tablet by mouth once a day 4)  Garlique 400 Mg Tbec (Garlic) .... Take one tablet  by mouth daily 5)  Multivitamins Tabs (Multiple vitamin) .... Take 1 tablet by mouth once a day 6)  Viactiv Multi-vitamin Chew (Multiple vitamins-calcium) .... Chew two daily 7)  Flax Seed Oil 1000 Mg Caps (Flaxseed (linseed)) .... One capsule by mouth twice a day 8)   Vitamin B-12 2500 Mcg Subl (Cyanocobalamin) .... One subligual daily as remebered. 9)  Prilosec Otc 20 Mg Tbec (Omeprazole magnesium) .Marland Kitchen.. 1 by mouth two times a day 10)  Exforge 5-320 Mg Tabs (Amlodipine besylate-valsartan) .... Take 1 tablet by mouth once a day 11)  Anusol-hc 2.5 % Crea (Hydrocortisone) .... Apply to rectal area at bedtime for 14 days  Patient Instructions: 1)  we will do GI referral at check out  2)  increase your prilosec to two times a day -- and when you can get me a list of covered alternatives for aciphex- let me know  3)  drink more water - at least 10-12 glasses per day- this will help the fiber work better so you are not as constipated  4)  colace is helpful for constipation -- try not to strain - this makes hemorroids better  5)  use anusol hc cream each bedtime for 2 weeks  Prescriptions: ANUSOL-HC 2.5 % CREA (HYDROCORTISONE) apply to rectal area at bedtime for 14 days  #1 medium x 0   Entered and Authorized by:   Judith Part MD   Signed by:   Judith Part MD on 01/26/2011   Method used:   Electronically to        [facility] [phone][facility] (retail)       [address]       [place], [place]  [address]       Ph: [phone] or [phone]       Fax: [phone]   RxID:   [id number]    Orders Added: 1)  Gastroenterology Referral [GI] 2)  Prescription Created Electronically [[id number]] 3)  Est. Patient Level IV [42595]    Current Allergies (reviewed today): ! SIMVASTATIN

## 2011-02-22 ENCOUNTER — Encounter: Payer: Self-pay | Admitting: Gastroenterology

## 2011-02-22 ENCOUNTER — Other Ambulatory Visit (INDEPENDENT_AMBULATORY_CARE_PROVIDER_SITE_OTHER): Payer: Medicare Other

## 2011-02-22 ENCOUNTER — Ambulatory Visit (INDEPENDENT_AMBULATORY_CARE_PROVIDER_SITE_OTHER): Payer: Medicare Other | Admitting: Gastroenterology

## 2011-02-22 DIAGNOSIS — K921 Melena: Secondary | ICD-10-CM

## 2011-02-22 DIAGNOSIS — R1310 Dysphagia, unspecified: Secondary | ICD-10-CM

## 2011-02-22 DIAGNOSIS — K5900 Constipation, unspecified: Secondary | ICD-10-CM

## 2011-02-22 DIAGNOSIS — K219 Gastro-esophageal reflux disease without esophagitis: Secondary | ICD-10-CM

## 2011-02-22 DIAGNOSIS — Z79899 Other long term (current) drug therapy: Secondary | ICD-10-CM

## 2011-02-22 LAB — CBC WITH DIFFERENTIAL/PLATELET
Basophils Absolute: 0 10*3/uL (ref 0.0–0.1)
Eosinophils Absolute: 0.1 10*3/uL (ref 0.0–0.7)
Hemoglobin: 13.4 g/dL (ref 12.0–15.0)
Lymphocytes Relative: 25.3 % (ref 12.0–46.0)
MCHC: 34.3 g/dL (ref 30.0–36.0)
MCV: 84.7 fl (ref 78.0–100.0)
Monocytes Absolute: 0.5 10*3/uL (ref 0.1–1.0)
Neutro Abs: 4 10*3/uL (ref 1.4–7.7)
RDW: 14.5 % (ref 11.5–14.6)

## 2011-02-22 MED ORDER — POLYETHYLENE GLYCOL 3350 17 GM/SCOOP PO POWD: 17.0000 g | Freq: Every day | ORAL | Status: AC

## 2011-02-22 MED ORDER — DEXLANSOPRAZOLE 60 MG PO CPDR: 60.0000 mg | DELAYED_RELEASE_CAPSULE | Freq: Every day | ORAL | Status: DC

## 2011-02-22 NOTE — Patient Instructions (Signed)
Please go to the basement today for your labs.  Stop Omeprazole, and take Dexilant once a day.  Take Miralax every night.  Make an office visit to come back and see Dr Jarold Motto in 3 weeks.

## 2011-02-22 NOTE — Progress Notes (Signed)
History of Present Illness:  This is a [name] with hypertensive cardiovascular disease, previous TIAs and mild cognitive dysfunction. She has chronic GERD documented by endoscopy in December of last year. Barium swallow was unremarkable, and empiric dilation did not help any of her symptomatology. She complains mostly of a globus sensation in the retropharyngeal area without true dysphagia. Her appetite is good and her weight is stable. She has marked constipation with straining and associated h3emorrhoidal  irritation and occasional bleeding. She denies nausea vomiting, or any systemic complaints or history of chronic anemia. I reviewed her records and cannot see where she's had previous colonoscopy. Rx. on Prilosec 20 mg a day and she has been unable to use local anal creams or suppositories.  I have reviewed this patient's present history, medical and surgical past history, allergies and medications.     ROS: The remainder of the Gi ROS is negative,  Past Medical History  Diagnosis Date  . Hypothyroidism   . Urinary incontinence   . Hypertension   . Arthritis   . Esophageal reflux   . Obesity   . Renal insufficiency   . OP (osteoporosis)   . TIA (transient ischemic attack)   . Upper GI bleed   . Hyperlipemia    Past Surgical History  Procedure Date  . Cardiac catheterization   . Cholecystectomy   . Tonsillectomy   . Abdominal hysterectomy     reports that she has never smoked. She has never used smokeless tobacco. She reports that she does not drink alcohol or use illicit drugs. family history includes Arthritis in her sister; Cancer in an unspecified family member; Dementia in her brother and mother; and Heart disease in her father.  There is no history of Colon cancer. Allergies  Allergen Reactions  . Simvastatin     REACTION: muscle pain/ memory issues      Physical Exam: General well developed well nourished patient in no acute distress, appearing  their stated age Eyes PERRLA, no icterus, fundoscopic exam per opthamologist Skin no lesions noted Neck supple, no adenopathy, no thyroid enlargement, no tenderness Chest clear to percussion and auscultation Heart no significant murmurs, gallops or rubs noted Abdomen no hepatosplenomegaly masses or tenderness, BS normal.  Rectal inspection normal no fissures, or fistulae noted.  No masses or tenderness on digital exam. Stool guaiac negative. Large prominent nonbleeding external hemorrhoids noted. Extremities no acute joint lesions, edema, phlebitis or evidence of cellulitis. Neurologic patient oriented x 3, cranial nerves intact, no focal neurologic deficits noted. Psychological mental status normal and normal affect.  Assessment and plan: She has chronic GERD with associated globus sensations. I've switched her to Dexilant 60 mg a day in place of Prilosec. We will try MiraLax 8 ounces at bedtime for constipation with when necessary Analpram cream as tolerated. Screening labs are been ordered. The patient has refused colonoscopy exam. We'll see her back in several weeks' time for followup.  Encounter Diagnoses  Name Primary?  . Constipation   . Dysphagia   . Blood in stool

## 2011-02-23 LAB — BASIC METABOLIC PANEL
CO2: 27 mEq/L (ref 19–32)
GFR: 29.56 mL/min — ABNORMAL LOW (ref >60.00)
Glucose, Bld: 98 mg/dL (ref 70–99)
Potassium: 5 mEq/L (ref 3.5–5.1)
Sodium: 142 mEq/L (ref 135–145)

## 2011-02-23 LAB — HEPATIC FUNCTION PANEL
ALT: 12 U/L (ref 0–35)
Alkaline Phosphatase: 79 U/L (ref 39–117)
Bilirubin, Direct: 0.2 mg/dL (ref 0.0–0.3)
Total Protein: 6.6 g/dL (ref 6.0–8.3)

## 2011-04-08 ENCOUNTER — Telehealth: Payer: Self-pay | Admitting: Gastroenterology

## 2011-04-08 MED ORDER — LIDOCAINE 5 % EX OINT: TOPICAL_OINTMENT | CUTANEOUS | Status: DC

## 2011-04-08 NOTE — Op Note (Signed)
NAME:  Margaret Hicks, Margaret Hicks                            ACCOUNT NO.:  [id number]   MEDICAL RECORD NO.:  [id number]                   PATIENT TYPE:  INP   LOCATION:  0101                                 FACILITY:  [facility]   PHYSICIAN:  Abigail Miyamoto, M.D.              DATE OF BIRTH:  Jun 15, 1932   DATE OF PROCEDURE:  12/16/2003  DATE OF DISCHARGE:                                 OPERATIVE REPORT   PREOPERATIVE DIAGNOSIS:  Cholecystitis.   POSTOPERATIVE DIAGNOSIS:  Cholecystitis.   PROCEDURE:  Laparoscopic cholecystectomy with intraoperative cholangiogram.   SURGEON:  Abigail Miyamoto, M.D.   ASSISTANT:  Gita Kudo, M.D.   ANESTHESIA:  General endotracheal anesthesia.   ESTIMATED BLOOD LOSS:  Minimal.   FINDINGS:  The patient was found to have a normal cholangiogram.   DESCRIPTION OF PROCEDURE:  The patient was brought to the operating room,  identified as Margaret Hicks. She was placed supine on the operating room table  and general anesthesia was induced. Her abdomen was then prepped and draped  in the usual sterile fashion.  Using a #15 blade,  small vertical incision  was made below the umbilicus. The incision was carried down to the fascia  which was then opened with the scalpel.  A hemostat was then used to pass  through the peritoneal cavity.  Next, a #0 Vicryl pursestring suture was  placed around the fascial opening. The Hasson port was placed through the  opening and insufflation of the abdomen was begun.  A 12 mm port was then  placed in the patient's epigastrium and two 5 mm ports were placed in the  patient's epigastrium and two 5 mm ports were placed in the patient's right  flank under direct vision. The gallbladder was then grasped and retracted  above the liver bed. Dissection was then carried out at the base of the  gallbladder. The gallbladder was indeed found to be mildly acutely inflamed.  The patient was found to have an anterior branch of the cystic artery, it  was clipped twice proximally and once distally __________ scissors.  The  cystic duct was then identified and clipped once distally.  It was then  partly transected with the laparoscopic scissors. An angiocatheter was then  inserted under direct vision in the right upper quadrant. The  cholangiocatheter was passed through this and placed into the opening of the  cystic duct. A cholangiogram was then performed under direct fluoroscopy  with contrast.  Contrast was seen to flow into the entire biliary system and  duodenum without evidence of abnormalities. The cholangiocatheter was then  completely removed. The cystic duct was then clipped three times proximally  and completed transected. The cystic artery was then identified and clipped  twice proximally and once distally and transected as well. The gallbladder  was then slowly dissected free from the liver bed with the electrocautery.  Once it was free  from the liver bed, it was removed through the incision at  the umbilicus. The #0 Vicryl at the umbilicus was tied in place closing the  fascial defect. The liver bed was again examined and hemostasis felt to be  achieved. The abdomen was irrigated with normal saline. The rest of the  abdominal contents appeared normal. The patient did have adhesions down in  the pelvis consisting of omentum to the abdominal wall.  All ports were then  removed under direct vision and the abdomen was deflated. All incisions were  anesthetized with 0.25% Marcaine and closed with 4-0 Monocryl  subcuticular sutures.  Steri-Strips, gauze and tape were then applied. The  patient tolerated the procedure well.  All sponge, needle and instrument  counts were correct at the end of the procedure. The patient was then  extubated in the operating room and taken in stable condition to the  recovery room.                                               Abigail Miyamoto, M.D.    DB/MEDQ  D:  12/16/2003  T:  12/16/2003   Job:  [id number]   cc:   Lianne Bushy, M.D.  [address]  [place]  [place] [address]  Fax: [phone]

## 2011-04-08 NOTE — Op Note (Signed)
NAME:  Margaret Hicks, Margaret Hicks                            ACCOUNT NO.:  [id number]   MEDICAL RECORD NO.:  [id number]                   PATIENT TYPE:  OIB   LOCATION:  5705                                 FACILITY:  [facility]   PHYSICIAN:  Velora Heckler, M.D.                DATE OF BIRTH:  13-Apr-1932   DATE OF PROCEDURE:  01/30/2003  DATE OF DISCHARGE:                                 OPERATIVE REPORT   PREOPERATIVE DIAGNOSIS:  Primary hyperparathyroidism.   POSTOPERATIVE DIAGNOSIS:  Primary hyperparathyroidism.   OPERATION PERFORMED:  Minimally invasive parathyroidectomy.   SURGEON:  Velora Heckler, M.D.   ASSISTANT:  Anselm Pancoast. Zachery Dakins, M.D.   ANESTHESIA:  General.   ANESTHESIOLOGIST:  Burna Forts, M.D.   ESTIMATED BLOOD LOSS:  Minimal.   PREPARATION:  Betadine.   COMPLICATIONS:  None.   INDICATIONS FOR PROCEDURE:  The patient is a 77 year old white female seen  at the request of Lianne Bushy, M.D. for hypercalcemia.  The patient had  been noted to have elevated serum calcium levels on occasions.  Calcium  ranged up to a high of 11.5.  Intact PTH level was elevated at 115.  Sestamibi scan was obtained at [facility] November 12, 2002.  This  localized increased abnormal activity in the left inferior parathyroid gland  position consistent with adenoma.  The patient now comes to surgery for  excision.   DESCRIPTION OF PROCEDURE:  The procedure was done in operating room #16  at  the [facility].  Prior to the procedure, the patient  had injected with sestamibi in the nuclear medicine lab.  The patient was  brought to the operating room and placed in supine position on the operating  room table.  Following administration of general anesthesia, the patient was  positioned and then prepped and draped in the usual strict aseptic fashion.  After ascertaining that an adequate level of anesthesia had been obtained,  the neck was scanned with a Neoprobe.   An area of increased activity was  noted in the left inferior neck.  A 3 cm incision was made with a #16 blade  and carried down through the platysma.  Hemostasis was obtained with the  electrocautery.  Skin flaps were elevated circumferentially.  A Weitlaner  retractor was placed for exposure.  Strap muscles were incised in the  midline.  Strap muscles were reflected to the left off of the thyroid gland.  Thyroid was rolled medially.  Using the Neoprobe for guidance, dissection at  the inferior pole of the thyroid in the tracheoesophageal groove reveals an  abnormally enlarged parathyroid gland.  This was carefully dissected out.  Vascular pedicle was divided between small Ligaclips.  The gland was  completely excised.  It measures approximately 3.5 cm in length.  It weighs  3100 mg.  Frozen section biopsy was obtained from Dr. Jonny Ruiz  Luisa Hart and was  consistent with parathyroid adenoma.  Ex vivo counts to the parathyroid  gland were roughly 225% of background.  Good hemostasis was noted.  Surgicel  was placed in the base of the wound.  Strap muscles were reapproximated in  the midline with interrupted 3-0 Vicryl sutures.  Platysma was closed with  interrupted with 3-0 Vicryl sutures.  Skin edges were anesthetized with  local anesthetic.  Skin edges were approximated with running 4-0 Vicryl  subcuticular suture.  The wound was washed and dried and Steri-Strips were  applied.  Sterile gauze dressings were applied.  The patient was then  transferred to the recovery room in stable condition.  The patient tolerated  the procedure well.                                                Velora Heckler, M.D.    TMG/MEDQ  D:  01/30/2003  T:  01/30/2003  Job:  [id number]   cc:   Lianne Bushy, M.D.  [address]  [place]  [place] [address]  Fax: [phone]

## 2011-04-08 NOTE — H&P (Signed)
NAME:  Margaret Hicks, Margaret Hicks                            ACCOUNT NO.:  [id number]   MEDICAL RECORD NO.:  [id number]                   PATIENT TYPE:  EMS   LOCATION:  ED                                   FACILITY[facility]  [facility]   PHYSICIAN:  Abigail Miyamoto, M.D.              DATE OF BIRTH:  1932/03/30   DATE OF ADMISSION:  12/16/2003  DATE OF DISCHARGE:                                HISTORY & PHYSICAL   CHIEF COMPLAINT:  Abdominal pain, nausea and vomiting.   HISTORY:  This is a 77 year old white female, a patient of Dr. Lianne Bushy, who presented to his office this morning with nausea, vomiting for  several weeks and epigastric abdominal pain as well as pain in the right  upper quadrant with findings worrisome for a possible cholecystitis.  She  was sent to [facility] ER for further evaluation.  The patient reports she  has had the epigastric and right upper quadrant abdominal pain off and on  for the past several weeks and it appears to be worse with fatty meals.  She  has had nausea and vomiting with this.  She had been mildly constipated,  although this appears to be resolving.  She also has some heartburn.  She  denies chest pain, shortness of breath, dysuria, fevers or chills.  She  reports she has been unable to keep anything down even liquids.  She denies  any jaundice.   PAST MEDICAL HISTORY:  1. Parathyroid adenoma.  2. Hypothyroidism.  3. Hypertension.  4. Gastroesophageal reflux disease.  5. Hypercholesterolemia.   PREVIOUS SURGICAL HISTORY:  1. Minimally invasive parathyroid resection.  2. Tonsillectomy.  3. Hysterectomy.   MEDICATIONS:  Lipitor, Synthroid, Diovan, Zoloft, Prevacid, Estradiol,  vitamin B-12.   ALLERGIES:  She is allergic to sulfa.   SOCIAL HISTORY:  She does not smoke, does not drink alcohol.   REVIEW OF SYSTEMS:  Otherwise negative.   PHYSICAL EXAMINATION:  GENERAL:  Reveals a well-developed, well-nourished  female in no acute distress.  She is well  in appearance.  VITAL SIGNS:  Temperature is 98.2, heart rate is 88, respiratory rate is 18,  blood pressure is 166/108.  EYES:  She is anicteric.  Pupils are reactive bilaterally.  EARS/NOSE/MOUTH/THROAT:  External ears and nose are normal.  Hearing is  mildly diminished.  Oropharynx is clear.  NECK:  Supple.  There is no cervical adenopathy.  There is no thyromegaly.  LUNGS:  Clear to auscultation bilaterally with normal effort.  CARDIOVASCULAR:  Regular, rate and rhythm with no murmurs.  There is no  peripheral edema.  ABDOMEN:  Soft with some mild tenderness in the epigastrium and right upper  quadrant with some mild guarding.  There are no masses or hernias.  The  abdomen is nondistended and soft throughout.  EXTREMITIES:  There is no edema, clubbing or cyanosis.  All extremities are  warm and well  perfused.   LABORATORY DATA:  The patient had an ultrasound of the abdomen showing her  to have gallstones.  There is no gallbladder wall thickening and a normal  common bile duct.   Laboratory data shows a white blood count of 5.4 with 62% neutrophils,  hemoglobin is 14.2.  BUN and creatinine are 18 and 1.5.  Potassium is low at  2.8 and calcium is 9.8.  Liver function tests show an alkaline phosphatase  elevated at 120, bilirubin is 1.1, AST is 23 and ALT is 15.   IMPRESSION:  This is a 77 year old female with probable symptomatic  cholelithiasis who is now dehydrated and showing hypokalemia from her  emesis.   PLAN:  The plan, at this point, will be to admit the patient, proceed with  IV re-hydration and proceed with laparoscopic cholecystectomy with  cholangiogram.  I have discussed this with her and her son in detail.  We  discussed the risks of surgery including bleeding, infection, common bile  duct injury __________ need for open surgery, etcetera.  At this point, they  wish to proceed.  The patient will be admitted and surgery will be  scheduled.                                                Abigail Miyamoto, M.D.    DB/MEDQ  D:  12/16/2003  T:  12/16/2003  Job:  [id number]   cc:   Lianne Bushy, M.D.  [address]  [place]  [place] [address]  Fax: [phone]

## 2011-04-08 NOTE — Telephone Encounter (Signed)
Pt with hx of strictures, last dilation 10/25/2010. Last COLON 10.31.2011 by Dr Ewing Schlein. Pt saw you on 02/22/11, and on rectal exam you reported "large prominent non bleeding external hemorrhoids"- no mention of surgery. Today, pt's daughter in law, reports pt is miserable and ready for surgery.  Ok to refer to [facility]?   Thanks.

## 2011-04-08 NOTE — Telephone Encounter (Signed)
Notified Lawson Radar that pt has been scheduled to see Dr Consuello Bossier at [facility] on 04/15/11 at 0900 for 0920am appt. Sent pt a reminder with directions to [facility]. Faxed notes to [facility]. [name] requested I order Lidocaine at [facility].

## 2011-04-08 NOTE — Telephone Encounter (Signed)
Yes..use 5% lidocaine cream prn also

## 2011-05-28 ENCOUNTER — Encounter: Payer: Self-pay | Admitting: Family Medicine

## 2011-06-03 ENCOUNTER — Encounter (INDEPENDENT_AMBULATORY_CARE_PROVIDER_SITE_OTHER): Payer: Medicare Other | Admitting: General Surgery

## 2011-06-08 ENCOUNTER — Ambulatory Visit: Payer: Self-pay | Admitting: Family Medicine

## 2011-06-08 DIAGNOSIS — Z0289 Encounter for other administrative examinations: Secondary | ICD-10-CM

## 2011-06-14 ENCOUNTER — Encounter (INDEPENDENT_AMBULATORY_CARE_PROVIDER_SITE_OTHER): Payer: Self-pay | Admitting: General Surgery

## 2011-06-14 ENCOUNTER — Ambulatory Visit (INDEPENDENT_AMBULATORY_CARE_PROVIDER_SITE_OTHER): Payer: Medicare Other | Admitting: General Surgery

## 2011-06-14 DIAGNOSIS — K649 Unspecified hemorrhoids: Secondary | ICD-10-CM

## 2011-06-14 NOTE — Progress Notes (Signed)
Subjective:     Patient ID: Margaret Hicks, female   DOB: 07-02-1932, 77 y.o.   MRN: [id number]  HPIThe patient is having difficulty and having regular movements and states it takes a long time for each trip to the bathroom she's had a colonoscopy approximately 3 years ago. Only occasionally is there a small mat of bleeding with a large bowel movement  Review of Systems     Objective:   Physical Exam     Assessment:     The patient will have her started examiner felt as if she needed to have a bowel movement and went to the bowel movement bathroom for approximately 15 minutes but did not have a bowel movement on examination both rectal and anoscopic she has hemorrhoids internally and externally. He is not constipated but there is soft stool within her rectum. The patient states her movements are more regular her daughter also is studies are not really change in. The patient's daughter-in-law states that she gets fixated on haven't have a bowel movement and various settings of which she gets Preoccupied and it's not that she's not having bowel movements.    Plan:    The patient's daughter involve tight that I could do a simple excision of an external hemorrhoidal tag today to count correct the problem fluid a degree of hemorrhoids that she has I would think that if we were going to need to do surgery it needs to be of normal internal and external hemorrhoidectomy I think however we need to get her stools much more regular with the use of MiraLax every morning and another dose in the evening if needed this retained hopefully her hemorrhoids we'll decrease in size and hopefully surgery will not be needed. The patient is somewhat confused and if we were planning on hemorrhoidectomy she would definitely need to be in the hospital one or 2 nights as I would be very uncomfortable trying to do this as an outpatient.  We'll make a decision on whether to proceed for surgery on at the next visit. I found no  blood on her exam and do not think that a preoperative colonoscopy as needed but I will check to make sure that has not been 3 years since her last colonoscopy.

## 2011-06-14 NOTE — Patient Instructions (Signed)
Take any regular dose of MiraLax every morning and drank a large amount of fluids each day. Plan on having a bowel movement and the even if you need another dose of MiraLax he may take it before bedtime hopefully the hemorrhoids will decrease in size and not need surgery. I will see you in one month for the next visit

## 2011-07-12 ENCOUNTER — Encounter (INDEPENDENT_AMBULATORY_CARE_PROVIDER_SITE_OTHER): Payer: Self-pay | Admitting: General Surgery

## 2011-07-13 ENCOUNTER — Encounter (INDEPENDENT_AMBULATORY_CARE_PROVIDER_SITE_OTHER): Payer: Self-pay | Admitting: General Surgery

## 2011-07-13 ENCOUNTER — Ambulatory Visit (INDEPENDENT_AMBULATORY_CARE_PROVIDER_SITE_OTHER): Payer: Medicare Other | Admitting: General Surgery

## 2011-07-13 VITALS — BP 136/88 | HR 64 | Temp 97.1°F

## 2011-07-13 DIAGNOSIS — K649 Unspecified hemorrhoids: Secondary | ICD-10-CM

## 2011-07-13 NOTE — Patient Instructions (Signed)
Continue with the MiraLax and other stool softeners b.i.d. Tried to limit the time in the bathroom to 5 minutes or less. A prolonged sitting or may toilet he is making your internal hemorrhoids prolapsed and do and harm. At present there greatly improved and when I saw a year originally. This is due to the good care that you are presently doing with preventing constipation

## 2011-07-13 NOTE — Progress Notes (Signed)
Subjective:     Patient ID: Margaret Hicks, female   DOB: 1932/08/06, 77 y.o.   MRN: [id number]  HPIMs. Margaret Hicks returns today with her son last visit she was coming about her daughter-in-law and appears to be doing better. She still likes to park herself in the bathroom for 30 minutes along A. limbs she's had a problem to have a bowel movement with the use of MiraLax and other stool stamina this has greatly improved she still tails that her hemorrhoids will swell up if she sits on the toilet for prolonged periods of time but on examination in the office at this time she is not prolapse and any hemorrhoids and a moderate size right external hemorrhoid is not thrombosed. Full habits are hard to break nd.   Review of Systems     Objective:   Physical ExamOn examination in the left lateral Sims position reveals a small to medium size right lateral hemorrhoid external lymph node from and smaller hemorrhoids in the other 2 quadrants. Anoscopic exam reveals mild hemorrhoids right lateral largest of these are greatly improved from her previous examination. Her son says that she still was suspended in long periods in the bathroom sit in order to a course this causes her hemorrhoid internally to prolapse I can not get any E. internal hemorrhoids true prolapse at this time.     Assessment:    Improve and management medically of hemorrhoids secondary to increase MiraLax and stool softener and a borderline prolonged sitting on the toilet. I would not recommend trying to excise just the external hemorrhoid at this time but she is doing much better and bathroom regularity    Plan:    Reevaluate in 2 month .

## 2011-08-02 ENCOUNTER — Encounter (INDEPENDENT_AMBULATORY_CARE_PROVIDER_SITE_OTHER): Payer: Medicare Other | Admitting: General Surgery

## 2011-08-23 ENCOUNTER — Encounter (INDEPENDENT_AMBULATORY_CARE_PROVIDER_SITE_OTHER): Payer: Self-pay

## 2011-08-24 ENCOUNTER — Ambulatory Visit (INDEPENDENT_AMBULATORY_CARE_PROVIDER_SITE_OTHER): Payer: Medicare Other | Admitting: General Surgery

## 2011-08-24 ENCOUNTER — Encounter (INDEPENDENT_AMBULATORY_CARE_PROVIDER_SITE_OTHER): Payer: Self-pay | Admitting: General Surgery

## 2011-08-24 VITALS — BP 138/92 | HR 80 | Temp 96.5°F | Resp 16 | Ht 61.0 in | Wt 210.1 lb

## 2011-08-24 DIAGNOSIS — K645 Perianal venous thrombosis: Secondary | ICD-10-CM

## 2011-08-24 NOTE — Progress Notes (Signed)
Subjective:     Patient ID: Margaret Hicks, female   DOB: [birth year], 77 y.o.   MRN: [id number]  HPI Margaret Hicks is a 77 year old female discontinuity of joint instability but she's got a large external hemorrhoid anteriorly that she is fixated on when I seen her previously she's had no hemorrhoids was taken if anything would definitely need to be done it would be to be a formal hemorrhoidectomy and recommended not really doing that. She used calyceal to the toilet for long periods of time she scar broken cycle she is on MiraLax and keep her stools soft and she returns today with her daughter-in-law on examination today she's not got the irritated hemorrhoids in all quadrants she is to get this large external hemorrhoidal tag and and has a small one and I would recommend that we plan on going to excise and it here in the office with local anesthesia at some time in the future if the daughter and also as it is not convenient time to try to do a day which she'll make arrangements so she's got available to care for 4-3 Hicks afterwards do them at the next visit she's not on any type of blood thinners and I think this can be done here in the office to  Review of Systems Current Outpatient Prescriptions  Medication Sig Dispense Refill  . amLODipine-valsartan (EXFORGE) 5-320 MG per tablet Take 1 tablet by mouth daily.        Marland Kitchen dexlansoprazole (DEXILANT) 60 MG capsule Take 60 mg by mouth daily.        . Flaxseed, Linseed, (FLAX SEED OIL) 1000 MG CAPS Take 1 capsule by mouth 2 (two) times daily.        . hydrocortisone (ANUSOL-HC) 2.5 % rectal cream Place rectally at bedtime. For 14 Hicks.      Marland Kitchen levothyroxine (SYNTHROID, LEVOTHROID) 100 MCG tablet Take 100 mcg by mouth daily.        Marland Kitchen lidocaine (XYLOCAINE) 5 % ointment Apply to rectal area as needed for hemorrhoids.  50 g  0  . metoprolol (LOPRESSOR) 100 MG tablet Take 100 mg by mouth 2 (two) times daily.        . Multiple Vitamin (MULTIVITAMIN) capsule Take 1  capsule by mouth daily.        . Multiple Vitamins-Calcium (VIACTIV MULTI-VITAMIN) CHEW Chew 2 tablets by mouth daily.        . polyethylene glycol powder (GLYCOLAX/MIRALAX) powder Daily.      . Probiotic Product (PROBIOTIC FORMULA PO) Take by mouth 2 (two) times daily.        Marland Kitchen spironolactone (ALDACTONE) 25 MG tablet Take 12.5 mg by mouth daily.              Objective:   Physical ExamBP 138/92  Pulse 80  Temp 96.5 F (35.8 C)  Resp 16  Ht 5\' 1"  (1.549 m)  Wt 210 lb 2 oz (95.312 kg)  BMI 39.70 kg/m2  Patient has a large external hemorrhoid tag anteriorly that is given her chronic symptoms and her other hemorrhoids are marked improved thank Dr. removed that single external hemorrhoid here with local anesthesia. Will plan on doing well office visit with daughters walls activity permit surgery a little more tenderness with her mother off was negative after the procedure. She'll continue with the MiraLax and avoid prolonged sitting and as this is definitely improved her hemorrhoids this since her previous examination    Assessment:  Plan:     See note above

## 2011-09-13 ENCOUNTER — Other Ambulatory Visit: Payer: Self-pay | Admitting: Gastroenterology

## 2011-11-05 ENCOUNTER — Other Ambulatory Visit: Payer: Self-pay | Admitting: Gastroenterology

## 2011-11-18 ENCOUNTER — Ambulatory Visit (INDEPENDENT_AMBULATORY_CARE_PROVIDER_SITE_OTHER): Payer: Medicare Other | Admitting: Family Medicine

## 2011-11-18 ENCOUNTER — Encounter: Payer: Self-pay | Admitting: Family Medicine

## 2011-11-18 VITALS — BP 140/76 | HR 68 | Temp 98.3°F | Ht 61.0 in | Wt 211.8 lb

## 2011-11-18 DIAGNOSIS — H6120 Impacted cerumen, unspecified ear: Secondary | ICD-10-CM | POA: Insufficient documentation

## 2011-11-18 DIAGNOSIS — R42 Dizziness and giddiness: Secondary | ICD-10-CM | POA: Insufficient documentation

## 2011-11-18 DIAGNOSIS — R1310 Dysphagia, unspecified: Secondary | ICD-10-CM

## 2011-11-18 NOTE — Assessment & Plan Note (Signed)
Recurrent with hx of esoph stricture in past - and pt cannot go under any anethesia to have it addressed  We disc imp of chewing foods well/ good fluid intake and eating slowly  Will continue to follow

## 2011-11-18 NOTE — Progress Notes (Signed)
Subjective:    Patient ID: Margaret Hicks, female    DOB: 24-Apr-1932, 77 y.o.   MRN: [id number]  HPI Margaret Hicks in to [facility] hospital -- with dizziness - was dx with inner ear problem - Saturday  Given meclizine for the dizziness  R ear is sore - and is occluded with wax   Now the dizziness is much better  Took meclizine for 5 Hicks  At first felt like the room was spinning - but no nausea or vomiting  No other neurol symptoms or speech problems   Just a little bit of wax out at home -- knows not to use a q tip   No nausea/ no fever  No cong or headache   Patient Active Problem List  Diagnoses  . HYPOTHYROIDISM  . HYPERLIPIDEMIA  . HYPERTENSION  . GERD  . GASTRITIS  . RENAL INSUFFICIENCY  . DYSPHAGIA UNSPECIFIED  . Unspecified urinary incontinence  . NONSPECIFIC ABNORM RESULTS KIDNEY FUNCTION STUDY  . UTI'S, HX OF  . EXTERNAL HEMORRHOIDS  . Cerumen impaction  . Vertigo   Past Medical History  Diagnosis Date  . Hypothyroidism   . Urinary incontinence   . Hypertension   . Arthritis   . Esophageal reflux   . Obesity   . Renal insufficiency   . OP (osteoporosis)   . TIA (transient ischemic attack)   . Upper GI bleed   . Hyperlipemia   . Hemorrhoids    Past Surgical History  Procedure Date  . Cardiac catheterization   . Cholecystectomy   . Tonsillectomy   . Abdominal hysterectomy    History  Substance Use Topics  . Smoking status: Never Smoker   . Smokeless tobacco: Never Used  . Alcohol Use: No   Family History  Problem Relation Age of Onset  . Dementia Mother   . Stroke Mother   . Arthritis Sister   . Dementia Brother   . Cancer      nephew/nephew  . Colon cancer Neg Hx   . Heart disease Father   . Dementia Brother    Allergies  Allergen Reactions  . Simvastatin     REACTION: muscle pain/ memory issues   Current Outpatient Prescriptions on File Prior to Visit  Medication Sig Dispense Refill  . amLODipine-valsartan (EXFORGE) 5-320 MG per tablet  Take 1 tablet by mouth daily.        Marland Kitchen DEXILANT 60 MG capsule TAKE ONE CAPSULE BY MOUTH EVERY DAY  30 capsule  6  . dexlansoprazole (DEXILANT) 60 MG capsule Take 60 mg by mouth daily.        . Flaxseed, Linseed, (FLAX SEED OIL) 1000 MG CAPS Take 1 capsule by mouth 2 (two) times daily.        Marland Kitchen levothyroxine (SYNTHROID, LEVOTHROID) 100 MCG tablet Take 100 mcg by mouth daily.        . metoprolol (LOPRESSOR) 100 MG tablet Take 100 mg by mouth 2 (two) times daily.        . Multiple Vitamin (MULTIVITAMIN) capsule Take 1 capsule by mouth daily.        . Multiple Vitamins-Calcium (VIACTIV MULTI-VITAMIN) CHEW Chew 2 tablets by mouth daily.        . polyethylene glycol powder (GLYCOLAX/MIRALAX) powder Daily.      . Probiotic Product (PROBIOTIC FORMULA PO) Take by mouth daily.       Marland Kitchen spironolactone (ALDACTONE) 25 MG tablet Take 12.5 mg by mouth daily.        Marland Kitchen  hydrocortisone (ANUSOL-HC) 2.5 % rectal cream Place rectally at bedtime. For 14 Hicks.      Marland Kitchen lidocaine (XYLOCAINE) 5 % ointment Apply to rectal area as needed for hemorrhoids.  50 g  0      Review of Systems Review of Systems  Constitutional: Negative for fever, appetite change, fatigue and unexpected weight change.  Eyes: Negative for pain and visual disturbance.  ENT pos for dec hearing from R ear , no ear pain, no congestion or facial pain  Respiratory: Negative for cough and shortness of breath.   Cardiovascular: Negative for cp or palpitations    Gastrointestinal: Negative for nausea, diarrhea and constipation. Pos for trouble swallowing some dry foods  Genitourinary: Negative for urgency and frequency.  Skin: Negative for pallor or rash   Neurological: Negative for weakness, light-headedness, numbness and headaches.  Hematological: Negative for adenopathy. Does not bruise/bleed easily.  Psychiatric/Behavioral: Negative for dysphoric mood. The patient is mildly anxious          Objective:   Physical Exam  Constitutional: She  appears well-developed and well-nourished. No distress.       overwt and well appearing elderly female  HENT:  Head: Normocephalic and atraumatic.  Left Ear: External ear normal.  Nose: Nose normal.  Mouth/Throat: Oropharynx is clear and moist.       R canal obscured by dry cerumen impaction   Eyes: Conjunctivae and EOM are normal. Pupils are equal, round, and reactive to light. No scleral icterus.       No nystagmus  R clavicle is more prominent baseline   Cardiovascular: Normal rate, regular rhythm, normal heart sounds and intact distal pulses.  Exam reveals no gallop.   Pulmonary/Chest: Effort normal and breath sounds normal. No respiratory distress. She has no wheezes.  Abdominal: Soft. Bowel sounds are normal. She exhibits no distension and no mass. There is no tenderness.  Musculoskeletal: Normal range of motion. She exhibits no edema and no tenderness.       No focal cerebellar signs   Neurological: She is alert. She has normal strength and normal reflexes. She displays no atrophy and no tremor. No cranial nerve deficit or sensory deficit. She exhibits normal muscle tone. She displays a negative Romberg sign. Coordination and gait normal.  Skin: Skin is warm and dry. No rash noted. No erythema. No pallor.  Psychiatric: She has a normal mood and affect.          Assessment & Plan:

## 2011-11-18 NOTE — Assessment & Plan Note (Signed)
Deep dry impaction - which limits hearing  Will try debrox to loosen impaction three times weekly for 2 weeks and then f/u for irrigation Will update if ear pain or other symptoms in the meantime

## 2011-11-18 NOTE — Patient Instructions (Signed)
Get some debrox or similar product over the counter and use it three times weekly in right ear - to loosen wax (for 2 weeks ) Then follow up in 2 weeks and we will try to flush the ear  If dizziness returns let me know - I'm glad you are doing better Chew your food well and drink good fluids  If swallowing issue worsens let me know - I suspect a possible esophageal stricture

## 2011-11-18 NOTE — Assessment & Plan Note (Signed)
Transient vertigo- now resolved after 5 d of meclizine and ER visit  Nl exam today Will update if symptoms return

## 2011-11-25 ENCOUNTER — Other Ambulatory Visit: Payer: Self-pay | Admitting: Gastroenterology

## 2011-11-25 MED ORDER — DEXLANSOPRAZOLE 60 MG PO CPDR: 60.0000 mg | DELAYED_RELEASE_CAPSULE | Freq: Every day | ORAL | Status: DC

## 2011-11-25 NOTE — Telephone Encounter (Signed)
rx sent, pt aware 

## 2011-12-02 ENCOUNTER — Ambulatory Visit: Payer: Medicare Other | Admitting: Family Medicine

## 2011-12-12 DIAGNOSIS — H2510 Age-related nuclear cataract, unspecified eye: Secondary | ICD-10-CM | POA: Diagnosis not present

## 2011-12-12 DIAGNOSIS — H26499 Other secondary cataract, unspecified eye: Secondary | ICD-10-CM | POA: Diagnosis not present

## 2011-12-19 ENCOUNTER — Encounter: Payer: Self-pay | Admitting: Family Medicine

## 2011-12-19 ENCOUNTER — Ambulatory Visit (INDEPENDENT_AMBULATORY_CARE_PROVIDER_SITE_OTHER): Payer: Medicare Other | Admitting: Family Medicine

## 2011-12-19 VITALS — BP 124/76 | HR 64 | Temp 97.9°F | Ht 61.0 in | Wt 210.5 lb

## 2011-12-19 DIAGNOSIS — H6120 Impacted cerumen, unspecified ear: Secondary | ICD-10-CM

## 2011-12-19 NOTE — Assessment & Plan Note (Signed)
Cerumen impaction in R ear is resolved with debrox and simple ear irrigation Relief and better hearing achieved inst to call if ear pain or other symptoms Can also use debrox prophylactically

## 2011-12-19 NOTE — Progress Notes (Signed)
Subjective:    Patient ID: Margaret Hicks, female    DOB: [birth year], 78 y.o.   MRN: [id number]  HPI Here for f/u of cerumen impaction Using debrox- not much came out   No ear pain  No more dizziness  Cannot hear well out of R ear - one with the wax Tries to dig it out with her finger  Patient Active Problem List  Diagnoses  . HYPOTHYROIDISM  . HYPERLIPIDEMIA  . HYPERTENSION  . GERD  . GASTRITIS  . RENAL INSUFFICIENCY  . DYSPHAGIA UNSPECIFIED  . Unspecified urinary incontinence  . NONSPECIFIC ABNORM RESULTS KIDNEY FUNCTION STUDY  . UTI'S, HX OF  . EXTERNAL HEMORRHOIDS  . Cerumen impaction  . Vertigo   Past Medical History  Diagnosis Date  . Hypothyroidism   . Urinary incontinence   . Hypertension   . Arthritis   . Esophageal reflux   . Obesity   . Renal insufficiency   . OP (osteoporosis)   . TIA (transient ischemic attack)   . Upper GI bleed   . Hyperlipemia   . Hemorrhoids    Past Surgical History  Procedure Date  . Cardiac catheterization   . Cholecystectomy   . Tonsillectomy   . Abdominal hysterectomy    History  Substance Use Topics  . Smoking status: Never Smoker   . Smokeless tobacco: Never Used  . Alcohol Use: No   Family History  Problem Relation Age of Onset  . Dementia Mother   . Stroke Mother   . Arthritis Sister   . Dementia Brother   . Cancer      nephew/nephew  . Colon cancer Neg Hx   . Heart disease Father   . Dementia Brother    Allergies  Allergen Reactions  . Simvastatin     REACTION: muscle pain/ memory issues   Current Outpatient Prescriptions on File Prior to Visit  Medication Sig Dispense Refill  . amLODipine-valsartan (EXFORGE) 5-320 MG per tablet Take 1 tablet by mouth daily.        Marland Kitchen DEXILANT 60 MG capsule TAKE ONE CAPSULE BY MOUTH EVERY DAY  30 capsule  6  . Flaxseed, Linseed, (FLAX SEED OIL) 1000 MG CAPS Take 1 capsule by mouth 2 (two) times daily.        Marland Kitchen levothyroxine (SYNTHROID, LEVOTHROID) 100 MCG tablet  Take 100 mcg by mouth daily.        . metoprolol (LOPRESSOR) 100 MG tablet Take 100 mg by mouth 2 (two) times daily.        . Multiple Vitamin (MULTIVITAMIN) capsule Take 1 capsule by mouth daily.        . Multiple Vitamins-Calcium (VIACTIV MULTI-VITAMIN) CHEW Chew 2 tablets by mouth daily.        . polyethylene glycol powder (GLYCOLAX/MIRALAX) powder Take 17 g by mouth Daily.       . Probiotic Product (PROBIOTIC FORMULA PO) Take by mouth daily.       Marland Kitchen spironolactone (ALDACTONE) 25 MG tablet Take 12.5 mg by mouth daily.        . hydrocortisone (ANUSOL-HC) 2.5 % rectal cream Place rectally at bedtime. For 14 Hicks.      Marland Kitchen lidocaine (XYLOCAINE) 5 % ointment Apply to rectal area as needed for hemorrhoids.  50 g  0      Review of Systems Review of Systems  Constitutional: Negative for fever, appetite change, fatigue and unexpected weight change.  Eyes: Negative for pain and visual disturbance.  ENT pos for  R ear fullness and decreased hearing , neg for pain  Respiratory: Negative for cough and shortness of breath.   Cardiovascular: Negative for cp or palpitations    Gastrointestinal: Negative for nausea, diarrhea and constipation.  Genitourinary: Negative for urgency and frequency.  Skin: Negative for pallor or rash   Neurological: Negative for weakness, light-headedness, numbness and headaches.  Hematological: Negative for adenopathy. Does not bruise/bleed easily.  Psychiatric/Behavioral: Negative for dysphoric mood. The patient is not nervous/anxious.          Objective:   Physical Exam  Constitutional: She appears well-developed and well-nourished. No distress.       overwt and well appearing   HENT:  Head: Normocephalic and atraumatic.  Right Ear: External ear normal.  Left Ear: External ear normal.       Full cerumen impaction R ear - fully cleared with simple irrigation - nl TM and canal seen Nl L canal   Eyes: Conjunctivae and EOM are normal. Pupils are equal, round, and  reactive to light. Right eye exhibits no discharge. Left eye exhibits no discharge.  Neck: Normal range of motion. Neck supple. No JVD present. Carotid bruit is not present. No thyromegaly present.  Cardiovascular: Normal rate and regular rhythm.   Pulmonary/Chest: Effort normal and breath sounds normal.  Musculoskeletal: Normal range of motion.  Lymphadenopathy:    She has no cervical adenopathy.  Neurological: She is alert. She has normal reflexes. No cranial nerve deficit.  Skin: Skin is warm and dry. No erythema.  Psychiatric: She has a normal mood and affect.       Cheerful and talkative          Assessment & Plan:

## 2011-12-19 NOTE — Patient Instructions (Signed)
Ear wax is gone  You can still use debrox occasionally (once a month) to keep wax loose  If any problems let me know

## 2012-01-06 ENCOUNTER — Encounter: Payer: Self-pay | Admitting: *Deleted

## 2012-01-06 ENCOUNTER — Telehealth: Payer: Self-pay | Admitting: Gastroenterology

## 2012-01-06 NOTE — Telephone Encounter (Signed)
Pt with cognitive dysfunction r/t TIAs , Chronic GERD, Constipation with Hemorrhoidal bleeding,etc; last OV 02/22/11. She then f/u with Dr Zachery Dakins for several visits d/t her hemorrhoids. He excised one or 2 in the ofc, but informed the family she really needed surgery. D/t her cognitive ability, they didn't know how she would handle the post recovery. Now, Dr Zachery Dakins has retired. Anyway, daughter in law states she's too heavy to get in the tub for SITZ baths. Can we order cream for her- old script for Anusol cream, may be out of date. She has a f/u on 01/17/12. I can't book her with you next week d/t her cataract surgery. Mid level schedule isn't open yet for Monday. Medication suggestion and other advice. Thanks.

## 2012-01-06 NOTE — Telephone Encounter (Signed)
lmom for daughter in law explaining pt needs to see her PCP per Dr Jarold Motto.

## 2012-01-06 NOTE — Telephone Encounter (Signed)
Opened in error

## 2012-01-06 NOTE — Telephone Encounter (Signed)
She does not need to see me...again i care can manage this.Marland KitchenMarland Kitchen

## 2012-01-06 NOTE — Telephone Encounter (Signed)
See previous messgae

## 2012-01-06 NOTE — Telephone Encounter (Signed)
Ok,,, these type of problems need to go to her primary care physician.

## 2012-01-10 DIAGNOSIS — H26499 Other secondary cataract, unspecified eye: Secondary | ICD-10-CM | POA: Diagnosis not present

## 2012-01-17 ENCOUNTER — Ambulatory Visit: Payer: Medicare Other | Admitting: Gastroenterology

## 2012-01-19 DIAGNOSIS — IMO0002 Reserved for concepts with insufficient information to code with codable children: Secondary | ICD-10-CM | POA: Diagnosis not present

## 2012-01-19 DIAGNOSIS — H2510 Age-related nuclear cataract, unspecified eye: Secondary | ICD-10-CM | POA: Diagnosis not present

## 2012-01-23 ENCOUNTER — Encounter: Payer: Self-pay | Admitting: *Deleted

## 2012-01-24 ENCOUNTER — Encounter: Payer: Medicare Other | Admitting: Gastroenterology

## 2012-01-24 NOTE — Progress Notes (Signed)
This encounter was created in error - please disregard.

## 2012-02-02 ENCOUNTER — Other Ambulatory Visit: Payer: Self-pay | Admitting: Family Medicine

## 2012-02-03 NOTE — Telephone Encounter (Signed)
Pt was last seen for ear wax removal 12/19/11. Cannot see where had lab  Since 12/01/10 and at that visit pt was to f/u in 6 months. Is ok to refill?

## 2012-02-05 NOTE — Telephone Encounter (Signed)
Can refil all for 6 months , thanks

## 2012-02-06 NOTE — Telephone Encounter (Signed)
[facility] request refill spironolactone 25 mg #45 x 2,metoprolol 100 mg #180 x 2 and Levothyroxine 100 mcg #90 x 2. Dr Milinda Antis said OK to refill for 6 months.

## 2012-02-27 DIAGNOSIS — N2581 Secondary hyperparathyroidism of renal origin: Secondary | ICD-10-CM | POA: Diagnosis not present

## 2012-02-27 DIAGNOSIS — I10 Essential (primary) hypertension: Secondary | ICD-10-CM | POA: Diagnosis not present

## 2012-02-29 DIAGNOSIS — R809 Proteinuria, unspecified: Secondary | ICD-10-CM | POA: Diagnosis not present

## 2012-02-29 DIAGNOSIS — N2581 Secondary hyperparathyroidism of renal origin: Secondary | ICD-10-CM | POA: Diagnosis not present

## 2012-02-29 DIAGNOSIS — I10 Essential (primary) hypertension: Secondary | ICD-10-CM | POA: Diagnosis not present

## 2012-04-25 DIAGNOSIS — H43819 Vitreous degeneration, unspecified eye: Secondary | ICD-10-CM | POA: Diagnosis not present

## 2012-04-25 DIAGNOSIS — H26499 Other secondary cataract, unspecified eye: Secondary | ICD-10-CM | POA: Diagnosis not present

## 2012-05-02 DIAGNOSIS — H20019 Primary iridocyclitis, unspecified eye: Secondary | ICD-10-CM | POA: Diagnosis not present

## 2012-05-02 DIAGNOSIS — H43819 Vitreous degeneration, unspecified eye: Secondary | ICD-10-CM | POA: Diagnosis not present

## 2012-05-02 DIAGNOSIS — H26499 Other secondary cataract, unspecified eye: Secondary | ICD-10-CM | POA: Diagnosis not present

## 2012-06-20 DIAGNOSIS — N2581 Secondary hyperparathyroidism of renal origin: Secondary | ICD-10-CM | POA: Diagnosis not present

## 2012-06-20 DIAGNOSIS — I10 Essential (primary) hypertension: Secondary | ICD-10-CM | POA: Diagnosis not present

## 2012-06-26 DIAGNOSIS — M7750 Other enthesopathy of unspecified foot: Secondary | ICD-10-CM | POA: Diagnosis not present

## 2012-10-10 DIAGNOSIS — I10 Essential (primary) hypertension: Secondary | ICD-10-CM | POA: Diagnosis not present

## 2012-10-10 DIAGNOSIS — N2581 Secondary hyperparathyroidism of renal origin: Secondary | ICD-10-CM | POA: Diagnosis not present

## 2012-11-04 ENCOUNTER — Other Ambulatory Visit: Payer: Self-pay | Admitting: Gastroenterology

## 2012-11-04 ENCOUNTER — Other Ambulatory Visit: Payer: Self-pay | Admitting: Family Medicine

## 2012-11-05 NOTE — Telephone Encounter (Signed)
Please schedule f/u in feb and refil until then, thanks

## 2012-11-05 NOTE — Telephone Encounter (Signed)
No recent appt or labs and no future appt, ok to refill? 

## 2012-11-05 NOTE — Telephone Encounter (Signed)
appt scheduled for feb and meds refilled until then

## 2012-11-05 NOTE — Telephone Encounter (Signed)
Left voicemail requesting pt to call our office, will try to call back later

## 2012-11-28 ENCOUNTER — Encounter: Payer: Self-pay | Admitting: Family Medicine

## 2012-11-28 ENCOUNTER — Ambulatory Visit (INDEPENDENT_AMBULATORY_CARE_PROVIDER_SITE_OTHER): Payer: Medicare Other | Admitting: Family Medicine

## 2012-11-28 VITALS — BP 154/92 | HR 61 | Temp 98.7°F | Ht 61.0 in | Wt 202.8 lb

## 2012-11-28 DIAGNOSIS — M545 Low back pain: Secondary | ICD-10-CM

## 2012-11-28 DIAGNOSIS — F329 Major depressive disorder, single episode, unspecified: Secondary | ICD-10-CM | POA: Diagnosis not present

## 2012-11-28 DIAGNOSIS — Z1231 Encounter for screening mammogram for malignant neoplasm of breast: Secondary | ICD-10-CM | POA: Insufficient documentation

## 2012-11-28 DIAGNOSIS — F32A Depression, unspecified: Secondary | ICD-10-CM | POA: Insufficient documentation

## 2012-11-28 DIAGNOSIS — M546 Pain in thoracic spine: Secondary | ICD-10-CM | POA: Diagnosis not present

## 2012-11-28 LAB — POCT URINALYSIS DIPSTICK
Leukocytes, UA: NEGATIVE
Protein, UA: NEGATIVE
Urobilinogen, UA: 0.2

## 2012-11-28 MED ORDER — SERTRALINE HCL 25 MG PO TABS: 25.0000 mg | ORAL_TABLET | Freq: Every day | ORAL | Status: DC

## 2012-11-28 NOTE — Progress Notes (Signed)
Subjective:    Patient ID: Margaret Hicks, female    DOB: 1932/09/22, 79 y.o.   MRN: [id number]  HPI Here for possible kidney infection  Is having some low back and flank pain on the R side (she actually then points to the scapular area)-ongoing but a little worse lately  No urinary burning or blood in urine  Does have incontinence issues   Sometimes is positional - and ref to her R foot -does have worse arthritis with the cold weather  Worse when she sits up straight , sometimes when she lies down  Feels best when she sits in a chair with a back on it and she can lean back  Does a little walking - has to go slow - not a lot of exercise  No numbness or weakness    Has hx of renal insuff and also incontinence   Chemistry      Component Value Date/Time   NA 142 02/22/2011 1550   K 5.0 02/22/2011 1550   CL 105 02/22/2011 1550   CO2 27 02/22/2011 1550   BUN 21 02/22/2011 1550   CREATININE 1.8* 02/22/2011 1550      Component Value Date/Time   CALCIUM 10.1 02/22/2011 1550   ALKPHOS 79 02/22/2011 1550   AST 21 02/22/2011 1550   ALT 12 02/22/2011 1550   BILITOT 1.0 02/22/2011 1550      She sees renal - last cr 1.6 and stable   Per pt's daughter- had to split she and her boyfriend up due to his health  She is down and more argumentative  Family has noticed that she is more tearful and missing Lewis   Has appt to see urologist - incontinence  Has been sad lately - with her boyfriend for a long time  He has poor health and that keeps them from being together  Sometimes tearful  She has to listen a lot  (her memory is not as good)  Some nights she sleeps well and some not  Appetite is ok    Patient Active Problem List  Diagnosis  . HYPOTHYROIDISM  . HYPERLIPIDEMIA  . HYPERTENSION  . GERD  . GASTRITIS  . RENAL INSUFFICIENCY  . DYSPHAGIA UNSPECIFIED  . Unspecified urinary incontinence  . NONSPECIFIC ABNORM RESULTS KIDNEY FUNCTION STUDY  . UTI'S, HX OF  . EXTERNAL HEMORRHOIDS  . Cerumen  impaction  . Vertigo  . Back pain, thoracic  . Depression  . Other screening mammogram   Past Medical History  Diagnosis Date  . Hypothyroidism   . Urinary incontinence   . Hypertension   . Arthritis   . Esophageal reflux   . Obesity   . Renal insufficiency   . OP (osteoporosis)   . TIA (transient ischemic attack)   . Upper GI bleed   . Hyperlipemia   . Hemorrhoids   . Acute gastritis   . Schatzki's ring    Past Surgical History  Procedure Date  . Cardiac catheterization   . Cholecystectomy   . Tonsillectomy   . Abdominal hysterectomy    History  Substance Use Topics  . Smoking status: Never Smoker   . Smokeless tobacco: Never Used  . Alcohol Use: No   Family History  Problem Relation Age of Onset  . Dementia Mother   . Stroke Mother   . Arthritis Sister   . Dementia Brother   . Cancer      nephew  . Colon cancer Neg Hx   . Heart  disease Father   . Dementia Brother    Allergies  Allergen Reactions  . Simvastatin     REACTION: muscle pain/ memory issues   Current Outpatient Prescriptions on File Prior to Visit  Medication Sig Dispense Refill  . amLODipine-valsartan (EXFORGE) 5-320 MG per tablet Take 1 tablet by mouth daily.        Marland Kitchen DEXILANT 60 MG capsule TAKE ONE CAPSULE BY MOUTH EVERY DAY  30 capsule  6  . Flaxseed, Linseed, (FLAX SEED OIL) 1000 MG CAPS Take 1 capsule by mouth 2 (two) times daily.        . hydrocortisone (ANUSOL-HC) 2.5 % rectal cream Place rectally at bedtime. For 14 Hicks.      Marland Kitchen levothyroxine (SYNTHROID, LEVOTHROID) 100 MCG tablet TAKE 1 TABLET BY MOUTH ONCE A DAY  90 tablet  2  . lidocaine (XYLOCAINE) 5 % ointment Apply to rectal area as needed for hemorrhoids.  50 g  0  . metoprolol (LOPRESSOR) 100 MG tablet TAKE ONE TABLET BY MOUTH TWICE A DAY  180 tablet  0  . Multiple Vitamin (MULTIVITAMIN) capsule Take 1 capsule by mouth daily.        . Multiple Vitamins-Calcium (VIACTIV MULTI-VITAMIN) CHEW Chew 2 tablets by mouth daily.          . Probiotic Product (PROBIOTIC FORMULA PO) Take by mouth daily.       Marland Kitchen spironolactone (ALDACTONE) 25 MG tablet TAKE 1/2 TABLET BY MOUTH ONCE A DAY  45 tablet  0  . sertraline (ZOLOFT) 25 MG tablet Take 1 tablet (25 mg total) by mouth daily.  30 tablet  11    Review of Systems   Review of Systems  Constitutional: Negative for fever, appetite change, fatigue and unexpected weight change.  Eyes: Negative for pain and visual disturbance.  Respiratory: Negative for cough and shortness of breath.   Cardiovascular: Negative for cp or palpitations    Gastrointestinal: Negative for nausea, diarrhea and constipation.  Genitourinary: Negative for urgency and frequency. pos for incontinence of urine  Skin: Negative for pallor or rash   MSK pos for back pain and general arthritis all over , neg for redness of joints Neurological: Negative for weakness, light-headedness, numbness and headaches.  Hematological: Negative for adenopathy. Does not bruise/bleed easily.  Psychiatric/Behavioral: pos for dysphoric mood and anxiety pos for poor short term memory.      Objective:   Physical Exam  Constitutional: She appears well-developed and well-nourished. No distress.       Obese frail appearing female with limited mobility  HENT:  Head: Normocephalic and atraumatic.  Mouth/Throat: Oropharynx is clear and moist.  Eyes: Conjunctivae normal and EOM are normal. Pupils are equal, round, and reactive to light. Right eye exhibits no discharge. Left eye exhibits no discharge. No scleral icterus.  Neck: Normal range of motion. Neck supple. No JVD present. Carotid bruit is not present. No thyromegaly present.  Cardiovascular: Normal rate and regular rhythm.   Pulmonary/Chest: Effort normal and breath sounds normal. No respiratory distress. She has no wheezes. She has no rales.  Abdominal: Soft. Bowel sounds are normal. She exhibits no distension, no abdominal bruit and no mass. There is no tenderness.   Musculoskeletal: She exhibits tenderness. She exhibits no edema.       Tender in musculature R of scapula, no bony tenderness Limited rom LS and TS OA in joints of hands Moves slowly No tender joints   Lymphadenopathy:    She has no cervical adenopathy.  Neurological: She is alert. She has normal reflexes. She displays no atrophy and no tremor. She exhibits normal muscle tone. Coordination and gait normal.  Skin: Skin is warm and dry. No rash noted. No erythema. No pallor.  Psychiatric: She has a normal mood and affect.       Talkative and cheerful but tangential in ideas, poor short term memory She also becomes tearful when discussing her boyfriend          Assessment & Plan:

## 2012-11-28 NOTE — Patient Instructions (Addendum)
For back soreness - use heat gently - like a hot water bottle and consider stretching and massage  Water exercise would help back pain and other pains too  Urine is clear today  For mood/ feeling down or depressed - try the zoloft in the evening - and if you have side effects or it makes you feel worse, stop it and let me know  Stay social and stay active  If you start the zoloft- follow up with me about 3 months later  We will set up mammogram at check out

## 2012-11-29 NOTE — Assessment & Plan Note (Signed)
Suspect muscular  rec consv tx with gentle heat and stretching  She would be app for gentle massage as well  Urged her to move more  ua neg

## 2012-11-29 NOTE — Assessment & Plan Note (Signed)
Scheduled annual mam

## 2012-11-29 NOTE — Assessment & Plan Note (Signed)
Given px for low dose zoloft and disc expectations and poss side eff in detail with she and family They will disc at home whether she wants to try it  Offered counseling and declined  F/u 3 mo

## 2012-12-10 ENCOUNTER — Other Ambulatory Visit: Payer: Self-pay | Admitting: Family Medicine

## 2012-12-26 ENCOUNTER — Ambulatory Visit: Payer: Self-pay | Admitting: Family Medicine

## 2012-12-26 ENCOUNTER — Encounter: Payer: Self-pay | Admitting: Family Medicine

## 2012-12-26 DIAGNOSIS — N6459 Other signs and symptoms in breast: Secondary | ICD-10-CM | POA: Diagnosis not present

## 2012-12-26 DIAGNOSIS — Z1231 Encounter for screening mammogram for malignant neoplasm of breast: Secondary | ICD-10-CM | POA: Diagnosis not present

## 2012-12-31 ENCOUNTER — Ambulatory Visit: Payer: Medicare Other | Admitting: Family Medicine

## 2013-01-09 ENCOUNTER — Encounter: Payer: Self-pay | Admitting: Family Medicine

## 2013-01-09 ENCOUNTER — Ambulatory Visit (INDEPENDENT_AMBULATORY_CARE_PROVIDER_SITE_OTHER): Payer: Medicare Other | Admitting: Family Medicine

## 2013-01-09 VITALS — BP 124/64 | HR 77 | Temp 99.0°F | Ht 61.0 in | Wt 196.0 lb

## 2013-01-09 DIAGNOSIS — F0391 Unspecified dementia with behavioral disturbance: Secondary | ICD-10-CM | POA: Diagnosis not present

## 2013-01-09 DIAGNOSIS — F329 Major depressive disorder, single episode, unspecified: Secondary | ICD-10-CM

## 2013-01-09 DIAGNOSIS — F0390 Unspecified dementia without behavioral disturbance: Secondary | ICD-10-CM | POA: Insufficient documentation

## 2013-01-09 MED ORDER — SERTRALINE HCL 50 MG PO TABS: 50.0000 mg | ORAL_TABLET | Freq: Every day | ORAL | Status: DC

## 2013-01-09 NOTE — Assessment & Plan Note (Signed)
Mixed with other cognitive issues/dementia Will inc zoloft for anxiety/ sleep and obsessive behaviors  F/u 3 mo  Rev poss side eff in detail and renal doctor watches sodium level

## 2013-01-09 NOTE — Assessment & Plan Note (Addendum)
This is worse with major life changes and loss lately- pt having difficulty adj to new environment and gets agitated Also depressed- beginning to treat that  Will ref to neurology for further eval Excellent care and safety at home for now - but may need facility in the future as this progresses  No new changes on exam >25 min spent with face to face with patient, >50% counseling and/or coordinating care

## 2013-01-09 NOTE — Progress Notes (Signed)
Subjective:    Patient ID: Margaret Hicks, female    DOB: Jun 20, 1932, 79 y.o.   MRN: [id number]  HPI Per family-not adjusting well to being with family more often Having angry outbursts  Compulsive habits - like licking her hands (? Ocd) Oldest son and Gerlene Burdock try to talk to her about it   Family thinks she has alzheimers and losing her boyfriend is really hard on her   She wants to stay with her oldest son  He travels a lot however  Family does not leave her alone for any period at time  House is busy- may be overwhelming her   Zoloft - ? If it has helped - is willing to go up on dose  She cannot comment on her mood  Still tearful  Appetite is fair -though trying to loose weight with a healthier diet  Does not sleep well overall  Problems concentrating and also with memory- and easy to get worried and anxious  Per patient : Still suffering from chronic pain - arms and legs  Some swelling   Patient Active Problem List  Diagnosis  . HYPOTHYROIDISM  . HYPERLIPIDEMIA  . HYPERTENSION  . GERD  . RENAL INSUFFICIENCY  . Unspecified urinary incontinence  . Renal insufficiency  . UTI'S, HX OF  . EXTERNAL HEMORRHOIDS  . Vertigo  . Back pain, thoracic  . Depression  . Other screening mammogram   Past Medical History  Diagnosis Date  . Hypothyroidism   . Urinary incontinence   . Hypertension   . Arthritis   . Esophageal reflux   . Obesity   . Renal insufficiency   . OP (osteoporosis)   . TIA (transient ischemic attack)   . Upper GI bleed   . Hyperlipemia   . Hemorrhoids   . Acute gastritis   . Schatzki's ring    Past Surgical History  Procedure Laterality Date  . Cardiac catheterization    . Cholecystectomy    . Tonsillectomy    . Abdominal hysterectomy     History  Substance Use Topics  . Smoking status: Never Smoker   . Smokeless tobacco: Never Used  . Alcohol Use: No   Family History  Problem Relation Age of Onset  . Dementia Mother   . Stroke Mother    . Arthritis Sister   . Dementia Brother   . Cancer      nephew  . Colon cancer Neg Hx   . Heart disease Father   . Dementia Brother    Allergies  Allergen Reactions  . Simvastatin     REACTION: muscle pain/ memory issues   Current Outpatient Prescriptions on File Prior to Visit  Medication Sig Dispense Refill  . amLODipine-valsartan (EXFORGE) 5-320 MG per tablet Take 1 tablet by mouth daily.        . cholecalciferol (VITAMIN D) 1000 UNITS tablet Take 1,000 Units by mouth daily.      Marland Kitchen DEXILANT 60 MG capsule TAKE ONE CAPSULE BY MOUTH EVERY DAY  30 capsule  6  . Flaxseed, Linseed, (FLAX SEED OIL) 1000 MG CAPS Take 1 capsule by mouth 2 (two) times daily.        . hydrocortisone (ANUSOL-HC) 2.5 % rectal cream Place rectally at bedtime. For 14 Hicks.      Marland Kitchen levothyroxine (SYNTHROID, LEVOTHROID) 100 MCG tablet TAKE 1 TABLET BY MOUTH ONCE A DAY  90 tablet  0  . lidocaine (XYLOCAINE) 5 % ointment Apply to rectal area as needed for  hemorrhoids.  50 g  0  . metoprolol (LOPRESSOR) 100 MG tablet TAKE ONE TABLET BY MOUTH TWICE A DAY  180 tablet  0  . Multiple Vitamin (MULTIVITAMIN) capsule Take 1 capsule by mouth daily.        . Multiple Vitamins-Calcium (VIACTIV MULTI-VITAMIN) CHEW Chew 2 tablets by mouth daily.        . Probiotic Product (PROBIOTIC FORMULA PO) Take by mouth daily.       . sertraline (ZOLOFT) 25 MG tablet Take 1 tablet (25 mg total) by mouth daily.  30 tablet  11  . spironolactone (ALDACTONE) 25 MG tablet TAKE 1/2 TABLET BY MOUTH ONCE A DAY  45 tablet  0  . spironolactone (ALDACTONE) 25 MG tablet TAKE 1/2 TABLET BY MOUTH ONCE A DAY  45 tablet  0   No current facility-administered medications on file prior to visit.     Review of Systems Review of Systems  Constitutional: Negative for fever, appetite change,  and unexpected weight change.  Eyes: Negative for pain and visual disturbance.  Respiratory: Negative for cough and shortness of breath.   Cardiovascular: Negative  for cp or palpitations    Gastrointestinal: Negative for nausea, diarrhea and constipation.  Genitourinary: Negative for urgency and frequency.  Skin: Negative for pallor or rash   Neurological: Negative for weakness, light-headedness, numbness and headaches. pos for poor short term memory and problems concentrating , with occas confusion Hematological: Negative for adenopathy. Does not bruise/bleed easily.  Psychiatric/Behavioral: pos for dep and anxiety with some obsessive behaviors        Objective:   Physical Exam  Constitutional: She appears well-developed and well-nourished. No distress.  obese and well appearing  Elderly female  HENT:  Head: Normocephalic and atraumatic.  Mouth/Throat: Oropharynx is clear and moist.  Eyes: Conjunctivae and EOM are normal. Pupils are equal, round, and reactive to light. Right eye exhibits no discharge. Left eye exhibits no discharge. No scleral icterus.  Neck: Normal range of motion. Neck supple. No JVD present. No thyromegaly present.  Cardiovascular: Normal rate, regular rhythm and intact distal pulses.  Exam reveals no gallop.   Pulmonary/Chest: Effort normal and breath sounds normal. No respiratory distress. She has no wheezes.  Abdominal: Soft. Bowel sounds are normal. She exhibits no distension and no mass. There is no tenderness.  No suprapubic tenderness or fullness    Musculoskeletal: She exhibits no edema.  Lymphadenopathy:    She has no cervical adenopathy.  Neurological: She is alert. She has normal reflexes. She displays no tremor. No cranial nerve deficit or sensory deficit. She exhibits normal muscle tone. Coordination normal.  Gait is slow and steady with walker  Skin: Skin is warm and dry. No rash noted. No erythema. No pallor.  Psychiatric: Her mood appears anxious. Her affect is labile. Her affect is not inappropriate. Her speech is tangential. Thought content is not paranoid. She exhibits a depressed mood. She expresses no  homicidal and no suicidal ideation. She exhibits abnormal recent memory.  Very tangential, pt also repeats herself frequently  At times tearful She is inattentive.          Assessment & Plan:

## 2013-01-09 NOTE — Patient Instructions (Addendum)
Increase your zoloft to 50 mg daily Let me know if any problems or side effects  We will do neurologist referral at check out  Follow up with me in about 2-3 months

## 2013-01-14 ENCOUNTER — Encounter: Payer: Self-pay | Admitting: Family Medicine

## 2013-01-14 ENCOUNTER — Ambulatory Visit: Payer: Self-pay | Admitting: Family Medicine

## 2013-01-14 DIAGNOSIS — R928 Other abnormal and inconclusive findings on diagnostic imaging of breast: Secondary | ICD-10-CM | POA: Diagnosis not present

## 2013-01-14 DIAGNOSIS — N6459 Other signs and symptoms in breast: Secondary | ICD-10-CM | POA: Diagnosis not present

## 2013-01-15 ENCOUNTER — Encounter: Payer: Self-pay | Admitting: *Deleted

## 2013-01-28 DIAGNOSIS — F0280 Dementia in other diseases classified elsewhere without behavioral disturbance: Secondary | ICD-10-CM | POA: Diagnosis not present

## 2013-01-28 DIAGNOSIS — G309 Alzheimer's disease, unspecified: Secondary | ICD-10-CM | POA: Diagnosis not present

## 2013-02-01 DIAGNOSIS — M7750 Other enthesopathy of unspecified foot: Secondary | ICD-10-CM | POA: Diagnosis not present

## 2013-02-07 ENCOUNTER — Other Ambulatory Visit: Payer: Self-pay | Admitting: Family Medicine

## 2013-02-13 DIAGNOSIS — N2581 Secondary hyperparathyroidism of renal origin: Secondary | ICD-10-CM | POA: Diagnosis not present

## 2013-02-13 DIAGNOSIS — I10 Essential (primary) hypertension: Secondary | ICD-10-CM | POA: Diagnosis not present

## 2013-03-01 ENCOUNTER — Telehealth: Payer: Self-pay | Admitting: Family Medicine

## 2013-03-01 NOTE — Telephone Encounter (Signed)
If she is physically mobile and can feed/ dress/ bathe herself - would likely do ok with assisted living
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
## 2013-03-01 NOTE — Telephone Encounter (Signed)
Margaret Hicks would like to know Dr. Royden Purl opinion on assisted living or skilled care for pt.  Requesting call back at [phone].

## 2013-03-01 NOTE — Telephone Encounter (Signed)
Given all that - I agree with need for skilled  Thanks

## 2013-03-13 ENCOUNTER — Other Ambulatory Visit: Payer: Self-pay | Admitting: Family Medicine

## 2013-04-10 ENCOUNTER — Ambulatory Visit (INDEPENDENT_AMBULATORY_CARE_PROVIDER_SITE_OTHER): Payer: Medicare Other | Admitting: Family Medicine

## 2013-04-10 ENCOUNTER — Encounter: Payer: Self-pay | Admitting: Family Medicine

## 2013-04-10 VITALS — BP 122/76 | HR 57 | Temp 99.2°F | Ht 61.0 in | Wt 178.8 lb

## 2013-04-10 DIAGNOSIS — E785 Hyperlipidemia, unspecified: Secondary | ICD-10-CM

## 2013-04-10 DIAGNOSIS — F03918 Unspecified dementia, unspecified severity, with other behavioral disturbance: Secondary | ICD-10-CM

## 2013-04-10 DIAGNOSIS — I10 Essential (primary) hypertension: Secondary | ICD-10-CM | POA: Diagnosis not present

## 2013-04-10 DIAGNOSIS — E039 Hypothyroidism, unspecified: Secondary | ICD-10-CM | POA: Diagnosis not present

## 2013-04-10 DIAGNOSIS — F329 Major depressive disorder, single episode, unspecified: Secondary | ICD-10-CM

## 2013-04-10 DIAGNOSIS — N259 Disorder resulting from impaired renal tubular function, unspecified: Secondary | ICD-10-CM

## 2013-04-10 DIAGNOSIS — F0391 Unspecified dementia with behavioral disturbance: Secondary | ICD-10-CM

## 2013-04-10 DIAGNOSIS — F32A Depression, unspecified: Secondary | ICD-10-CM

## 2013-04-10 LAB — COMPREHENSIVE METABOLIC PANEL
ALT: 13 U/L (ref 0–35)
AST: 18 U/L (ref 0–37)
Albumin: 3.6 g/dL (ref 3.5–5.2)
Alkaline Phosphatase: 93 U/L (ref 39–117)
Calcium: 9.4 mg/dL (ref 8.4–10.5)
Chloride: 104 mEq/L (ref 96–112)
Potassium: 4 mEq/L (ref 3.5–5.1)
Sodium: 137 mEq/L (ref 135–145)

## 2013-04-10 LAB — LIPID PANEL
LDL Cholesterol: 129 mg/dL — ABNORMAL HIGH (ref 0–99)
Total CHOL/HDL Ratio: 5
Triglycerides: 139 mg/dL (ref 0.0–149.0)
VLDL: 27.8 mg/dL (ref 0.0–40.0)

## 2013-04-10 LAB — CBC WITH DIFFERENTIAL/PLATELET
Eosinophils Relative: 0.6 % (ref 0.0–5.0)
HCT: 41.3 % (ref 36.0–46.0)
Hemoglobin: 14.2 g/dL (ref 12.0–15.0)
Lymphs Abs: 1.6 10*3/uL (ref 0.7–4.0)
MCV: 79.9 fl (ref 78.0–100.0)
Monocytes Absolute: 0.5 10*3/uL (ref 0.1–1.0)
Neutro Abs: 3.6 10*3/uL (ref 1.4–7.7)
Platelets: 223 10*3/uL (ref 150.0–400.0)
RDW: 14.6 % (ref 11.5–14.6)
WBC: 5.7 10*3/uL (ref 4.5–10.5)

## 2013-04-10 MED ORDER — LEVOTHYROXINE SODIUM 100 MCG PO TABS: 100.0000 ug | ORAL_TABLET | Freq: Every day | ORAL | Status: DC

## 2013-04-10 MED ORDER — SERTRALINE HCL 50 MG PO TABS: 50.0000 mg | ORAL_TABLET | Freq: Every day | ORAL | Status: DC

## 2013-04-10 MED ORDER — AMLODIPINE BESYLATE-VALSARTAN 5-320 MG PO TABS: 1.0000 | ORAL_TABLET | Freq: Every day | ORAL | Status: DC

## 2013-04-10 MED ORDER — SPIRONOLACTONE 25 MG PO TABS: 12.5000 mg | ORAL_TABLET | Freq: Every day | ORAL | Status: DC

## 2013-04-10 MED ORDER — METOPROLOL TARTRATE 100 MG PO TABS: 100.0000 mg | ORAL_TABLET | Freq: Two times a day (BID) | ORAL | Status: DC

## 2013-04-10 MED ORDER — DEXLANSOPRAZOLE 60 MG PO CPDR: 60.0000 mg | DELAYED_RELEASE_CAPSULE | Freq: Every day | ORAL | Status: DC

## 2013-04-10 NOTE — Progress Notes (Signed)
Subjective:    Patient ID: Margaret Hicks, female    DOB: [birth year], 79 y.o.   MRN: [id number]  HPI Here for f/u of dementia and depression  Overall doing better  Had to have a callous removed from foot  Spot on back of her head to check - has had it a long time - hairdresser notes it   Pt admits mood is improved     Went up on zoloft to 50  Saw neuro- Dr Vivien Presto more med due to renal issues  Dementia is progressive Having good and bad Hicks  Found a facility that may work for her - would go into assist living -locked unit Changed mind about needing skilled care for now  Will bring FL2 when ready   Is lonely and bored at home and needs more interaction in general  However with zoloft- much less moodiness - and seems "settled in "  Less agitated Less tearful   Wt is down 18 lb - family is working on that hard - and is happy with that  Is eating - just smaller portions and healthier foods  Her renal doctor is pleased with that     Patient Active Problem List   Diagnosis Date Noted  . Dementia with behavioral disturbance 01/09/2013  . Back pain, thoracic 11/28/2012  . Depression 11/28/2012  . Other screening mammogram 11/28/2012  . Vertigo 11/18/2011  . EXTERNAL HEMORRHOIDS 01/26/2011  . GERD 09/01/2010  . HYPOTHYROIDISM 05/27/2010  . HYPERLIPIDEMIA 05/27/2010  . HYPERTENSION 05/27/2010  . RENAL INSUFFICIENCY 05/27/2010  . Unspecified urinary incontinence 05/27/2010  . Renal insufficiency 05/27/2010  . UTI'S, HX OF 05/27/2010   Past Medical History  Diagnosis Date  . Hypothyroidism   . Urinary incontinence   . Hypertension   . Arthritis   . Esophageal reflux   . Obesity   . Renal insufficiency   . OP (osteoporosis)   . TIA (transient ischemic attack)   . Upper GI bleed   . Hyperlipemia   . Hemorrhoids   . Acute gastritis   . Schatzki's ring    Past Surgical History  Procedure Laterality Date  . Cardiac catheterization    . Cholecystectomy     . Tonsillectomy    . Abdominal hysterectomy     History  Substance Use Topics  . Smoking status: Never Smoker   . Smokeless tobacco: Never Used  . Alcohol Use: No   Family History  Problem Relation Age of Onset  . Dementia Mother   . Stroke Mother   . Arthritis Sister   . Dementia Brother   . Cancer      nephew  . Colon cancer Neg Hx   . Heart disease Father   . Dementia Brother    Allergies  Allergen Reactions  . Simvastatin     REACTION: muscle pain/ memory issues   Current Outpatient Prescriptions on File Prior to Visit  Medication Sig Dispense Refill  . amLODipine-valsartan (EXFORGE) 5-320 MG per tablet Take 1 tablet by mouth daily.        . cholecalciferol (VITAMIN D) 1000 UNITS tablet Take 1,000 Units by mouth daily.      Marland Kitchen DEXILANT 60 MG capsule TAKE ONE CAPSULE BY MOUTH EVERY DAY  30 capsule  6  . Flaxseed, Linseed, (FLAX SEED OIL) 1000 MG CAPS Take 1 capsule by mouth 2 (two) times daily.        . hydrocortisone (ANUSOL-HC) 2.5 % rectal cream Place rectally at bedtime.  For 14 Hicks.      Marland Kitchen levothyroxine (SYNTHROID, LEVOTHROID) 100 MCG tablet TAKE 1 TABLET BY MOUTH EVERY DAY  90 tablet  0  . lidocaine (XYLOCAINE) 5 % ointment Apply to rectal area as needed for hemorrhoids.  50 g  0  . metoprolol (LOPRESSOR) 100 MG tablet TAKE 1 TABLET BY MOUTH TWICE A DAY  180 tablet  0  . Multiple Vitamin (MULTIVITAMIN) capsule Take 1 capsule by mouth daily.        . Multiple Vitamins-Calcium (VIACTIV MULTI-VITAMIN) CHEW Chew 2 tablets by mouth daily.        . Probiotic Product (PROBIOTIC FORMULA PO) Take by mouth daily.       . sertraline (ZOLOFT) 50 MG tablet Take 1 tablet (50 mg total) by mouth daily.  30 tablet  5  . spironolactone (ALDACTONE) 25 MG tablet TAKE 1/2 TABLET BY MOUTH EVERY DAY  45 tablet  0   No current facility-administered medications on file prior to visit.    Review of Systems Review of Systems  Constitutional: Negative for fever, appetite change, fatigue  and unexpected weight change.  Eyes: Negative for pain and visual disturbance.  Respiratory: Negative for cough and shortness of breath.   Cardiovascular: Negative for cp or palpitations    Gastrointestinal: Negative for nausea, diarrhea and constipation.  Genitourinary: Negative for urgency and frequency.  Skin: Negative for pallor or rash   Neurological: Negative for weakness, light-headedness, numbness and headaches.  Hematological: Negative for adenopathy. Does not bruise/bleed easily.  Psychiatric/Behavioral: pos for depression/ anx and memory loss        Objective:   Physical Exam  Constitutional: She appears well-developed and well-nourished. No distress.  Wt loss noted   HENT:  Head: Normocephalic and atraumatic.  Mouth/Throat: Oropharynx is clear and moist.  Eyes: Conjunctivae and EOM are normal. Pupils are equal, round, and reactive to light. Right eye exhibits no discharge. Left eye exhibits no discharge. No scleral icterus.  Neck: Normal range of motion. Neck supple. No JVD present. Carotid bruit is not present. No thyromegaly present.  Cardiovascular: Normal rate, regular rhythm, normal heart sounds and intact distal pulses.  Exam reveals no gallop.   Pulmonary/Chest: Effort normal and breath sounds normal. No respiratory distress. She has no wheezes.  Abdominal: Soft. Bowel sounds are normal. She exhibits no distension, no abdominal bruit and no mass. There is no tenderness.  Musculoskeletal: She exhibits no edema.  Lymphadenopathy:    She has no cervical adenopathy.  Neurological: She is alert. She has normal reflexes. No cranial nerve deficit. She exhibits normal muscle tone. Coordination normal.  Skin: Skin is warm and dry. No rash noted. No erythema. No pallor.  Psychiatric: Her behavior is normal. Her mood appears anxious. Her affect is not blunt and not labile. Her speech is tangential. Thought content is not paranoid. She does not exhibit a depressed mood. She  exhibits abnormal recent memory.  Tangential in speech - she sometimes does not answer questions appropriately  Seems content, not tearful          Assessment & Plan:

## 2013-04-10 NOTE — Patient Instructions (Addendum)
I sent px to your pharmacy  No change in medicines  Take care of yourself  Keep working on healthy diet and keep mind and body active  Labs today  Follow up in about 6 months

## 2013-04-11 ENCOUNTER — Encounter: Payer: Self-pay | Admitting: *Deleted

## 2013-04-11 NOTE — Assessment & Plan Note (Signed)
Overall stable to slt worse- but depression is improved Disc with family plan to place her in assisted living with opt to go to skilled care at any time  Disc safety at home Will bring FL2  Rev visit with neuro- choice made not to medicate in light of renal status

## 2013-04-11 NOTE — Assessment & Plan Note (Signed)
Lab today Diet has improved quite a bit

## 2013-04-11 NOTE — Assessment & Plan Note (Signed)
Will continue renal f/u - avoiding excess medicines

## 2013-04-11 NOTE — Assessment & Plan Note (Signed)
bp in fair control at this time  No changes needed  Disc lifstyle change with low sodium diet and exercise  Lab today 

## 2013-04-11 NOTE — Assessment & Plan Note (Signed)
Improved with zoloft- will continue this

## 2013-04-11 NOTE — Assessment & Plan Note (Signed)
Lab today No clinical changes (wt loss was intentional)

## 2013-05-12 ENCOUNTER — Emergency Department (HOSPITAL_COMMUNITY)
Admission: EM | Admit: 2013-05-12 | Discharge: 2013-05-12 | Disposition: A | Discharge status: Home / Self Care | Payer: Medicare Other | Attending: Emergency Medicine | Admitting: Emergency Medicine

## 2013-05-12 ENCOUNTER — Encounter (HOSPITAL_COMMUNITY): Payer: Self-pay | Admitting: Emergency Medicine

## 2013-05-12 DIAGNOSIS — R55 Syncope and collapse: Secondary | ICD-10-CM | POA: Insufficient documentation

## 2013-05-12 DIAGNOSIS — K219 Gastro-esophageal reflux disease without esophagitis: Secondary | ICD-10-CM | POA: Insufficient documentation

## 2013-05-12 DIAGNOSIS — Z8673 Personal history of transient ischemic attack (TIA), and cerebral infarction without residual deficits: Secondary | ICD-10-CM | POA: Diagnosis not present

## 2013-05-12 DIAGNOSIS — Z79899 Other long term (current) drug therapy: Secondary | ICD-10-CM | POA: Insufficient documentation

## 2013-05-12 DIAGNOSIS — I10 Essential (primary) hypertension: Secondary | ICD-10-CM | POA: Diagnosis not present

## 2013-05-12 DIAGNOSIS — Z8739 Personal history of other diseases of the musculoskeletal system and connective tissue: Secondary | ICD-10-CM | POA: Diagnosis not present

## 2013-05-12 DIAGNOSIS — E039 Hypothyroidism, unspecified: Secondary | ICD-10-CM | POA: Insufficient documentation

## 2013-05-12 DIAGNOSIS — Z87448 Personal history of other diseases of urinary system: Secondary | ICD-10-CM | POA: Insufficient documentation

## 2013-05-12 DIAGNOSIS — Z90710 Acquired absence of both cervix and uterus: Secondary | ICD-10-CM | POA: Insufficient documentation

## 2013-05-12 DIAGNOSIS — Z9889 Other specified postprocedural states: Secondary | ICD-10-CM | POA: Diagnosis not present

## 2013-05-12 DIAGNOSIS — Z87738 Personal history of other specified (corrected) congenital malformations of digestive system: Secondary | ICD-10-CM | POA: Insufficient documentation

## 2013-05-12 DIAGNOSIS — Z862 Personal history of diseases of the blood and blood-forming organs and certain disorders involving the immune mechanism: Secondary | ICD-10-CM | POA: Diagnosis not present

## 2013-05-12 DIAGNOSIS — E669 Obesity, unspecified: Secondary | ICD-10-CM | POA: Insufficient documentation

## 2013-05-12 DIAGNOSIS — Z8679 Personal history of other diseases of the circulatory system: Secondary | ICD-10-CM | POA: Insufficient documentation

## 2013-05-12 DIAGNOSIS — Z8639 Personal history of other endocrine, nutritional and metabolic disease: Secondary | ICD-10-CM | POA: Insufficient documentation

## 2013-05-12 DIAGNOSIS — Z9861 Coronary angioplasty status: Secondary | ICD-10-CM | POA: Diagnosis not present

## 2013-05-12 DIAGNOSIS — Z9089 Acquired absence of other organs: Secondary | ICD-10-CM | POA: Diagnosis not present

## 2013-05-12 LAB — CBC WITH DIFFERENTIAL/PLATELET
Eosinophils Absolute: 0.1 10*3/uL (ref 0.0–0.7)
Eosinophils Relative: 1 % (ref 0–5)
HCT: 41.1 % (ref 36.0–46.0)
Lymphs Abs: 1.8 10*3/uL (ref 0.7–4.0)
MCH: 27.7 pg (ref 26.0–34.0)
MCV: 81.2 fL (ref 78.0–100.0)
Monocytes Absolute: 0.4 10*3/uL (ref 0.1–1.0)
Platelets: 183 10*3/uL (ref 150–400)
RBC: 5.06 MIL/uL (ref 3.87–5.11)

## 2013-05-12 LAB — URINALYSIS, ROUTINE W REFLEX MICROSCOPIC
Bilirubin Urine: NEGATIVE
Glucose, UA: NEGATIVE mg/dL
Hgb urine dipstick: NEGATIVE
Ketones, ur: NEGATIVE mg/dL
Protein, ur: NEGATIVE mg/dL
Urobilinogen, UA: 0.2 mg/dL (ref 0.0–1.0)

## 2013-05-12 LAB — BASIC METABOLIC PANEL
BUN: 20 mg/dL (ref 6–23)
Calcium: 9.6 mg/dL (ref 8.4–10.5)
Creatinine, Ser: 1.22 mg/dL — ABNORMAL HIGH (ref 0.50–1.10)
GFR calc non Af Amer: 41 mL/min — ABNORMAL LOW (ref >90)
Glucose, Bld: 90 mg/dL (ref 70–99)
Sodium: 139 mEq/L (ref 135–145)

## 2013-05-12 LAB — URINE MICROSCOPIC-ADD ON

## 2013-05-12 MED ORDER — SODIUM CHLORIDE 0.9 % IV BOLUS (SEPSIS)
1000.0000 mL | Freq: Once | INTRAVENOUS | Status: AC
  Administered 2013-05-12: 1000 mL via INTRAVENOUS

## 2013-05-12 NOTE — ED Notes (Signed)
Patient brought in via [facility]. C/O sitting in church and she stood up and felt her vision narrow, she then felt dizzy, sat down and the issue resolved. Family advised a similar episode 1 week ago which also resolved. Patient has Alzheimer. Patient is Alert and able to communicate needs at this time she denies pain or problems.

## 2013-05-14 NOTE — ED Provider Notes (Signed)
History    CSN: [id number] Arrival date & time 05/12/13  1402  First MD Initiated Contact with Patient 05/12/13 1420     Chief Complaint  Patient presents with  . Dizziness   (Consider location/radiation/quality/duration/timing/severity/associated sxs/prior Treatment) HPI Comments: Patient brought in via [facility] with cc of near syncope. C/O sitting in church and she stood up and felt her vision narrow, she then felt dizzy, sat down and the issue resolved. Family advised a similar episode 1 week ago which also resolved. Patient has Alzheimer, and not a great historian. She reports no chest pain or palpitations with this episode.  The history is provided by the patient and a relative.   Past Medical History  Diagnosis Date  . Hypothyroidism   . Urinary incontinence   . Hypertension   . Arthritis   . Esophageal reflux   . Obesity   . Renal insufficiency   . OP (osteoporosis)   . TIA (transient ischemic attack)   . Upper GI bleed   . Hyperlipemia   . Hemorrhoids   . Acute gastritis   . Schatzki's ring    Past Surgical History  Procedure Laterality Date  . Cardiac catheterization    . Cholecystectomy    . Tonsillectomy    . Abdominal hysterectomy     Family History  Problem Relation Age of Onset  . Dementia Mother   . Stroke Mother   . Arthritis Sister   . Dementia Brother   . Cancer      nephew  . Colon cancer Neg Hx   . Heart disease Father   . Dementia Brother    History  Substance Use Topics  . Smoking status: Never Smoker   . Smokeless tobacco: Never Used  . Alcohol Use: No   OB History   Grav Para Term Preterm Abortions TAB SAB Ect Mult Living                 Review of Systems  Constitutional: Negative for activity change.  HENT: Negative for neck pain.   Respiratory: Negative for shortness of breath.   Cardiovascular: Negative for chest pain.  Gastrointestinal: Negative for nausea, vomiting and abdominal pain.  Genitourinary: Negative for  dysuria.  Neurological: Positive for dizziness and light-headedness. Negative for headaches.    Allergies  Simvastatin  Home Medications   Current Outpatient Rx  Name  Route  Sig  Dispense  Refill  . amLODipine-valsartan (EXFORGE) 5-320 MG per tablet   Oral   Take 1 tablet by mouth daily.   90 tablet   3   . cholecalciferol (VITAMIN D) 1000 UNITS tablet   Oral   Take 1,000 Units by mouth daily.         Marland Kitchen dexlansoprazole (DEXILANT) 60 MG capsule   Oral   Take 1 capsule (60 mg total) by mouth daily.   90 capsule   3   . Flaxseed, Linseed, (FLAX SEED OIL) 1000 MG CAPS   Oral   Take 1 capsule by mouth 2 (two) times daily.           . hydrocortisone (ANUSOL-HC) 2.5 % rectal cream   Rectal   Place rectally at bedtime. For 14 days.         Marland Kitchen levothyroxine (SYNTHROID, LEVOTHROID) 100 MCG tablet   Oral   Take 1 tablet (100 mcg total) by mouth daily before breakfast.   90 tablet   3   . lidocaine (XYLOCAINE) 5 % ointment  Apply to rectal area as needed for hemorrhoids.   50 g   0   . metoprolol (LOPRESSOR) 100 MG tablet   Oral   Take 1 tablet (100 mg total) by mouth 2 (two) times daily.   180 tablet   3   . Multiple Vitamin (MULTIVITAMIN) capsule   Oral   Take 1 capsule by mouth daily.           . Multiple Vitamins-Calcium (VIACTIV MULTI-VITAMIN) CHEW   Oral   Chew 2 tablets by mouth daily.           . Probiotic Product (PROBIOTIC FORMULA PO)   Oral   Take by mouth daily.          . sertraline (ZOLOFT) 50 MG tablet   Oral   Take 1 tablet (50 mg total) by mouth daily.   90 tablet   3   . spironolactone (ALDACTONE) 25 MG tablet   Oral   Take 0.5 tablets (12.5 mg total) by mouth daily.   45 tablet   3    BP 147/89  Pulse 57  Temp(Src) 98.3 F (36.8 C) (Oral)  Resp 18  SpO2 97% Physical Exam  Nursing note and vitals reviewed. Constitutional: She appears well-developed and well-nourished.  HENT:  Head: Normocephalic and  atraumatic.  Eyes: EOM are normal. Pupils are equal, round, and reactive to light.  Neck: Neck supple.  Cardiovascular: Normal rate, regular rhythm and normal heart sounds.   No murmur heard. Pulmonary/Chest: Effort normal. No respiratory distress.  Abdominal: Soft. She exhibits no distension. There is no tenderness. There is no rebound and no guarding.  Neurological: She is alert.  Cerebellar exam is normal (finger to nose) Sensory exam normal for bilateral upper and lower extremities - and patient is able to discriminate between sharp and dull. Motor exam is 4+/5   Skin: Skin is warm and dry.    ED Course  Procedures (including critical care time) Labs Reviewed  BASIC METABOLIC PANEL - Abnormal; Notable for the following:    Creatinine, Ser 1.22 (*)    GFR calc non Af Amer 41 (*)    GFR calc Af Amer 48 (*)    All other components within normal limits  URINALYSIS, ROUTINE W REFLEX MICROSCOPIC - Abnormal; Notable for the following:    Leukocytes, UA TRACE (*)    All other components within normal limits  CBC WITH DIFFERENTIAL  URINE MICROSCOPIC-ADD ON  POCT I-STAT TROPONIN I   No results found. 1. Vasovagal near syncope     MDM  DDx includes: Orthostatic hypotension Stroke Vertebral artery dissection/stenosis Dysrhythmia PE Vasovagal/neurocardiogenic syncope Aortic stenosis Valvular disorder/Cardiomyopathy Anemia   Date: 05/14/2013  Rate: 55  Rhythm: normal sinus rhythm  QRS Axis: normal  Intervals: normal  ST/T Wave abnormalities: nonspecific ST/T changes  Conduction Disutrbances:none  Narrative Interpretation:   Old EKG Reviewed: none available - inferiolateral t wave inversions  Pt comes in with a near syncopal event. It appears to be a neurocardiogenic vs. Orthostatic event- patient got up, felt dizzy, felt nauseated - and her sx resolved after a few minutes.  She has some EKG t wave inversions - but i am not concerned for acute process otherwise. Will  get screening labs and cardiac tele continuously. If labs normal - we shall discharge.     Derwood Kaplan, MD 05/14/13 [phone]

## 2013-05-21 ENCOUNTER — Ambulatory Visit: Payer: Medicare Other | Admitting: Family Medicine

## 2013-05-23 ENCOUNTER — Other Ambulatory Visit: Payer: Self-pay | Admitting: Family Medicine

## 2013-05-24 ENCOUNTER — Emergency Department (HOSPITAL_COMMUNITY)
Admission: EM | Admit: 2013-05-24 | Discharge: 2013-05-24 | Disposition: A | Discharge status: Home / Self Care | Payer: Medicare Other | Attending: Emergency Medicine | Admitting: Emergency Medicine

## 2013-05-24 ENCOUNTER — Encounter (HOSPITAL_COMMUNITY): Payer: Self-pay | Admitting: Nurse Practitioner

## 2013-05-24 ENCOUNTER — Emergency Department (HOSPITAL_COMMUNITY): Payer: Medicare Other

## 2013-05-24 DIAGNOSIS — K219 Gastro-esophageal reflux disease without esophagitis: Secondary | ICD-10-CM | POA: Diagnosis not present

## 2013-05-24 DIAGNOSIS — F0390 Unspecified dementia without behavioral disturbance: Secondary | ICD-10-CM | POA: Insufficient documentation

## 2013-05-24 DIAGNOSIS — Q391 Atresia of esophagus with tracheo-esophageal fistula: Secondary | ICD-10-CM | POA: Insufficient documentation

## 2013-05-24 DIAGNOSIS — E785 Hyperlipidemia, unspecified: Secondary | ICD-10-CM | POA: Diagnosis not present

## 2013-05-24 DIAGNOSIS — Z8679 Personal history of other diseases of the circulatory system: Secondary | ICD-10-CM | POA: Insufficient documentation

## 2013-05-24 DIAGNOSIS — Z9089 Acquired absence of other organs: Secondary | ICD-10-CM | POA: Insufficient documentation

## 2013-05-24 DIAGNOSIS — M129 Arthropathy, unspecified: Secondary | ICD-10-CM | POA: Diagnosis not present

## 2013-05-24 DIAGNOSIS — M810 Age-related osteoporosis without current pathological fracture: Secondary | ICD-10-CM | POA: Diagnosis not present

## 2013-05-24 DIAGNOSIS — Z9889 Other specified postprocedural states: Secondary | ICD-10-CM | POA: Insufficient documentation

## 2013-05-24 DIAGNOSIS — Z90710 Acquired absence of both cervix and uterus: Secondary | ICD-10-CM | POA: Insufficient documentation

## 2013-05-24 DIAGNOSIS — E039 Hypothyroidism, unspecified: Secondary | ICD-10-CM | POA: Insufficient documentation

## 2013-05-24 DIAGNOSIS — R109 Unspecified abdominal pain: Secondary | ICD-10-CM | POA: Diagnosis not present

## 2013-05-24 DIAGNOSIS — E669 Obesity, unspecified: Secondary | ICD-10-CM | POA: Diagnosis not present

## 2013-05-24 DIAGNOSIS — I10 Essential (primary) hypertension: Secondary | ICD-10-CM | POA: Insufficient documentation

## 2013-05-24 DIAGNOSIS — Z79899 Other long term (current) drug therapy: Secondary | ICD-10-CM | POA: Insufficient documentation

## 2013-05-24 DIAGNOSIS — Z8719 Personal history of other diseases of the digestive system: Secondary | ICD-10-CM | POA: Insufficient documentation

## 2013-05-24 DIAGNOSIS — R079 Chest pain, unspecified: Secondary | ICD-10-CM | POA: Diagnosis not present

## 2013-05-24 DIAGNOSIS — Z8673 Personal history of transient ischemic attack (TIA), and cerebral infarction without residual deficits: Secondary | ICD-10-CM | POA: Diagnosis not present

## 2013-05-24 DIAGNOSIS — R1013 Epigastric pain: Secondary | ICD-10-CM | POA: Diagnosis not present

## 2013-05-24 DIAGNOSIS — Z87448 Personal history of other diseases of urinary system: Secondary | ICD-10-CM | POA: Insufficient documentation

## 2013-05-24 LAB — COMPREHENSIVE METABOLIC PANEL
ALT: 11 U/L (ref 0–35)
Albumin: 3.8 g/dL (ref 3.5–5.2)
Alkaline Phosphatase: 100 U/L (ref 39–117)
BUN: 19 mg/dL (ref 6–23)
Chloride: 102 mEq/L (ref 96–112)
Glucose, Bld: 99 mg/dL (ref 70–99)
Potassium: 3.6 mEq/L (ref 3.5–5.1)
Sodium: 137 mEq/L (ref 135–145)
Total Bilirubin: 0.6 mg/dL (ref 0.3–1.2)
Total Protein: 6.7 g/dL (ref 6.0–8.3)

## 2013-05-24 LAB — CBC WITH DIFFERENTIAL/PLATELET
Eosinophils Absolute: 0.1 10*3/uL (ref 0.0–0.7)
Hemoglobin: 14.4 g/dL (ref 12.0–15.0)
Lymphs Abs: 1.8 10*3/uL (ref 0.7–4.0)
Monocytes Relative: 8 % (ref 3–12)
Neutro Abs: 2.6 10*3/uL (ref 1.7–7.7)
Neutrophils Relative %: 53 % (ref 43–77)
Platelets: 190 10*3/uL (ref 150–400)
RBC: 5.17 MIL/uL — ABNORMAL HIGH (ref 3.87–5.11)
WBC: 4.8 10*3/uL (ref 4.0–10.5)

## 2013-05-24 LAB — LIPASE, BLOOD: Lipase: 47 U/L (ref 11–59)

## 2013-05-24 MED ORDER — SODIUM CHLORIDE 0.9 % IV SOLN
INTRAVENOUS | Status: DC
  Administered 2013-05-24: 21:00:00 via INTRAVENOUS

## 2013-05-24 MED ORDER — SUCRALFATE 1 G PO TABS
1.0000 g | ORAL_TABLET | Freq: Three times a day (TID) | ORAL | Status: DC
  Administered 2013-05-24: 1 g via ORAL
  Filled 2013-05-24 (×3): qty 1

## 2013-05-24 MED ORDER — GI COCKTAIL ~~LOC~~
30.0000 mL | Freq: Once | ORAL | Status: AC
  Administered 2013-05-24: 30 mL via ORAL
  Filled 2013-05-24 (×2): qty 30

## 2013-05-24 NOTE — ED Provider Notes (Signed)
History    CSN: [id number] Arrival date & time 05/24/13  1943  First MD Initiated Contact with Patient 05/24/13 1948     Chief Complaint  Patient presents with  . Epigastric    (Consider location/radiation/quality/duration/timing/severity/associated sxs/prior Treatment) The history is provided by the patient, a relative and the EMS personnel. The history is limited by the condition of the patient.   patient here with epigastric pain which extended into her chest. She does have a history of dementia. According to family and EMS patient was sitting down at the kitchen table and began to complain of severe pain. There is no vomiting. No recent fever or chills. Patient does have a history of GERD. She was noted to be cool and clammy on arrival but that quickly resolved. She is currently back to her baseline. Spoke with her relative, and she states that she's had this a times in the past. Past Medical History  Diagnosis Date  . Hypothyroidism   . Urinary incontinence   . Hypertension   . Arthritis   . Esophageal reflux   . Obesity   . Renal insufficiency   . OP (osteoporosis)   . TIA (transient ischemic attack)   . Upper GI bleed   . Hyperlipemia   . Hemorrhoids   . Acute gastritis   . Schatzki's ring    Past Surgical History  Procedure Laterality Date  . Cardiac catheterization    . Cholecystectomy    . Tonsillectomy    . Abdominal hysterectomy     Family History  Problem Relation Age of Onset  . Dementia Mother   . Stroke Mother   . Arthritis Sister   . Dementia Brother   . Cancer      nephew  . Colon cancer Neg Hx   . Heart disease Father   . Dementia Brother    History  Substance Use Topics  . Smoking status: Never Smoker   . Smokeless tobacco: Never Used  . Alcohol Use: No   OB History   Grav Para Term Preterm Abortions TAB SAB Ect Mult Living                 Review of Systems  All other systems reviewed and are negative.    Allergies   Simvastatin  Home Medications   Current Outpatient Rx  Name  Route  Sig  Dispense  Refill  . amLODipine-valsartan (EXFORGE) 5-320 MG per tablet   Oral   Take 1 tablet by mouth daily.   90 tablet   3   . cholecalciferol (VITAMIN D) 1000 UNITS tablet   Oral   Take 1,000 Units by mouth daily.         Marland Kitchen dexlansoprazole (DEXILANT) 60 MG capsule   Oral   Take 1 capsule (60 mg total) by mouth daily.   90 capsule   3   . Flaxseed, Linseed, (FLAX SEED OIL) 1000 MG CAPS   Oral   Take 1 capsule by mouth 2 (two) times daily.           . hydrocortisone (ANUSOL-HC) 2.5 % rectal cream   Rectal   Place rectally at bedtime. For 14 days.         Marland Kitchen levothyroxine (SYNTHROID, LEVOTHROID) 100 MCG tablet   Oral   Take 1 tablet (100 mcg total) by mouth daily before breakfast.   90 tablet   3   . lidocaine (XYLOCAINE) 5 % ointment      Apply to  rectal area as needed for hemorrhoids.   50 g   0   . metoprolol (LOPRESSOR) 100 MG tablet   Oral   Take 1 tablet (100 mg total) by mouth 2 (two) times daily.   180 tablet   3   . Multiple Vitamin (MULTIVITAMIN) capsule   Oral   Take 1 capsule by mouth daily.           . Multiple Vitamins-Calcium (VIACTIV MULTI-VITAMIN) CHEW   Oral   Chew 2 tablets by mouth daily.           . Probiotic Product (PROBIOTIC FORMULA PO)   Oral   Take by mouth daily.          . sertraline (ZOLOFT) 50 MG tablet   Oral   Take 1 tablet (50 mg total) by mouth daily.   90 tablet   3   . spironolactone (ALDACTONE) 25 MG tablet   Oral   Take 0.5 tablets (12.5 mg total) by mouth daily.   45 tablet   3    BP 151/76  Pulse 63  Temp(Src) 98.2 F (36.8 C) (Oral)  Resp 18  SpO2 99% Physical Exam  Nursing note and vitals reviewed. Constitutional: She is oriented to person, place, and time. She appears well-developed and well-nourished.  Non-toxic appearance. No distress.  HENT:  Head: Normocephalic and atraumatic.  Eyes: Conjunctivae, EOM  and lids are normal. Pupils are equal, round, and reactive to light.  Neck: Normal range of motion. Neck supple. No tracheal deviation present. No mass present.  Cardiovascular: Normal rate, regular rhythm and normal heart sounds.  Exam reveals no gallop.   No murmur heard. Pulmonary/Chest: Effort normal and breath sounds normal. No stridor. No respiratory distress. She has no decreased breath sounds. She has no wheezes. She has no rhonchi. She has no rales.  Abdominal: Soft. Normal appearance and bowel sounds are normal. She exhibits no distension. There is no tenderness. There is no rigidity, no rebound, no guarding and no CVA tenderness.  Musculoskeletal: Normal range of motion. She exhibits no edema and no tenderness.  Neurological: She is alert and oriented to person, place, and time. She has normal strength. No cranial nerve deficit or sensory deficit. GCS eye subscore is 4. GCS verbal subscore is 5. GCS motor subscore is 6.  Skin: Skin is warm and dry. No abrasion and no rash noted.  Psychiatric: She has a normal mood and affect. Her speech is normal and behavior is normal.    ED Course  Procedures (including critical care time) Labs Reviewed  CBC WITH DIFFERENTIAL  COMPREHENSIVE METABOLIC PANEL  LIPASE, BLOOD   No results found. No diagnosis found.  MDM   Date: 05/24/2013  Rate: 59  Rhythm: normal sinus rhythm  QRS Axis: normal  Intervals: normal  ST/T Wave abnormalities: nonspecific T wave changes  Conduction Disutrbances:none  Narrative Interpretation:   Old EKG Reviewed: unchanged   10:09 PM Patient given GI cocktail and meds and feels better. Does have a history of dementia so it is difficult to figure out exactly what happened this evening. I doubt that this was cardiac in nature I suspect that was GI. She is due for discharge  Toy Baker, MD 05/24/13 2210

## 2013-05-24 NOTE — ED Notes (Signed)
Per EMS pt was sitting in chair begin having severe epigastric pain. Upon medic arrival pt was clammy & cool, upon ems arrival pt warm and dry. Pt has severe dementia- unable to ascertain pain descriptors.

## 2013-05-24 NOTE — ED Notes (Signed)
Discharge instructions reviewed with son. Son verbalized understanding.

## 2013-05-29 ENCOUNTER — Ambulatory Visit (INDEPENDENT_AMBULATORY_CARE_PROVIDER_SITE_OTHER): Payer: Medicare Other | Admitting: Family Medicine

## 2013-05-29 ENCOUNTER — Encounter: Payer: Self-pay | Admitting: Family Medicine

## 2013-05-29 VITALS — BP 126/72 | HR 73 | Temp 99.2°F | Ht 61.0 in | Wt 182.8 lb

## 2013-05-29 DIAGNOSIS — K219 Gastro-esophageal reflux disease without esophagitis: Secondary | ICD-10-CM

## 2013-05-29 DIAGNOSIS — I951 Orthostatic hypotension: Secondary | ICD-10-CM | POA: Diagnosis not present

## 2013-05-29 DIAGNOSIS — I728 Aneurysm of other specified arteries: Secondary | ICD-10-CM | POA: Insufficient documentation

## 2013-05-29 DIAGNOSIS — R1310 Dysphagia, unspecified: Secondary | ICD-10-CM | POA: Insufficient documentation

## 2013-05-29 DIAGNOSIS — Z111 Encounter for screening for respiratory tuberculosis: Secondary | ICD-10-CM | POA: Diagnosis not present

## 2013-05-29 NOTE — Progress Notes (Signed)
Subjective:    Patient ID: Margaret Hicks, female    DOB: 12/16/1931, 79 y.o.   MRN: [id number]  HPI Pt here for f/u of 2 ER visits- the first for near syncope at church and the 2nd for brief epigastric pain   Episode one- got dizzy at church- stood up and sat back down Hx of doing this more than once  Vision change- ? Tunnel vision (before she feels like she may pass out)  No n/v and no loss of consciousness Called ems  Thought to be neurocardiogenica or orthostatic event Nl EKG and studies This happens frequently and is usually positional  ? If needs to back off on some of her bp meds  Sees her kidney doctor again in the next 2-3 weeks   Wt has been up and down    Episode 2 -had pain in epigastric area at kitchen table -was brief and moderate Cold and clammy  ? Thought esoph spasm  Now she has epigastric discomfort and trouble swallowing- with trouble burping and regurg  On dexilant right now  Has not seen GI in a long time (7-8 years)  Went to ER-nl EKG nad labs  Did have 1.6 cm splenic artery aneurysm on imaging Nl cxr  Got better on its own   bp good today bp is stable today  No cp or palpitations or headaches or edema  No side effects to medicines  BP Readings from Last 3 Encounters:  05/29/13 126/72  05/24/13 94/64  05/12/13 147/89     Wt is up 4 lb with bmi of 34   Assisted living- [facility] in [place] city- getting ready to move She has been more agitated lately/ more of a temper but still very sweet disposition Needs a PPD today  Patient Active Problem List   Diagnosis Date Noted  . Orthostatic hypotension 05/29/2013  . Dysphagia, unspecified(787.20) 05/29/2013  . Aneurysm, splenic artery 05/29/2013  . Dementia with behavioral disturbance 01/09/2013  . Back pain, thoracic 11/28/2012  . Depression 11/28/2012  . Other screening mammogram 11/28/2012  . Vertigo 11/18/2011  . EXTERNAL HEMORRHOIDS 01/26/2011  . GERD 09/01/2010  . HYPOTHYROIDISM  05/27/2010  . HYPERLIPIDEMIA 05/27/2010  . HYPERTENSION 05/27/2010  . RENAL INSUFFICIENCY 05/27/2010  . Unspecified urinary incontinence 05/27/2010  . Renal insufficiency 05/27/2010  . UTI'S, HX OF 05/27/2010   Past Medical History  Diagnosis Date  . Hypothyroidism   . Urinary incontinence   . Hypertension   . Arthritis   . Esophageal reflux   . Obesity   . Renal insufficiency   . OP (osteoporosis)   . TIA (transient ischemic attack)   . Upper GI bleed   . Hyperlipemia   . Hemorrhoids   . Acute gastritis   . Schatzki's ring    Past Surgical History  Procedure Laterality Date  . Cardiac catheterization    . Cholecystectomy    . Tonsillectomy    . Abdominal hysterectomy     History  Substance Use Topics  . Smoking status: Never Smoker   . Smokeless tobacco: Never Used  . Alcohol Use: No   Family History  Problem Relation Age of Onset  . Dementia Mother   . Stroke Mother   . Arthritis Sister   . Dementia Brother   . Cancer      nephew  . Colon cancer Neg Hx   . Heart disease Father   . Dementia Brother    Allergies  Allergen Reactions  . Simvastatin  REACTION: muscle pain/ memory issues   Current Outpatient Prescriptions on File Prior to Visit  Medication Sig Dispense Refill  . amLODipine-valsartan (EXFORGE) 5-320 MG per tablet Take 1 tablet by mouth daily.  90 tablet  3  . cholecalciferol (VITAMIN D) 1000 UNITS tablet Take 1,000 Units by mouth every evening.       Marland Kitchen dexlansoprazole (DEXILANT) 60 MG capsule Take 1 capsule (60 mg total) by mouth daily.  90 capsule  3  . Flaxseed, Linseed, (FLAX SEED OIL) 1000 MG CAPS Take 1 capsule by mouth 2 (two) times daily.        . hydrocortisone (ANUSOL-HC) 2.5 % rectal cream Place 1 application rectally 2 (two) times daily as needed for hemorrhoids.      Marland Kitchen levothyroxine (SYNTHROID, LEVOTHROID) 100 MCG tablet Take 1 tablet (100 mcg total) by mouth daily before breakfast.  90 tablet  3  . lidocaine (XYLOCAINE) 5 %  ointment Apply 1 application topically as needed (for rectal pain).      . Multiple Vitamin (MULTIVITAMIN) capsule Take 1 capsule by mouth daily.        . Probiotic Product (PROBIOTIC FORMULA PO) Take by mouth daily.       . sertraline (ZOLOFT) 50 MG tablet Take 1 tablet (50 mg total) by mouth daily.  90 tablet  3  . spironolactone (ALDACTONE) 25 MG tablet Take 0.5 tablets (12.5 mg total) by mouth daily.  45 tablet  3   No current facility-administered medications on file prior to visit.    Review of Systems Review of Systems  Constitutional: Negative for fever, appetite change,  and unexpected weight change.  Eyes: Negative for pain and visual disturbance.  Respiratory: Negative for cough and shortness of breath.   Cardiovascular: Negative for cp or palpitations    Gastrointestinal: Negative for nausea, diarrhea and constipation. pos for gerd symptoms and trouble swallowing  Genitourinary: Negative for urgency and frequency.  Skin: Negative for pallor or rash   Neurological: Negative for weakness, , numbness and headaches. pos for occ lightheadedness Hematological: Negative for adenopathy. Does not bruise/bleed easily.  Psychiatric/Behavioral: Negative for dysphoric mood. The patient is anxious / at times agitated/ pos for dementia with beh changes also         Objective:   Physical Exam  Constitutional: She appears well-developed and well-nourished. No distress.  Frail appearing obese elderly female with tangential speech with dementia  HENT:  Head: Normocephalic and atraumatic.  Mouth/Throat: Oropharynx is clear and moist.  Eyes: Conjunctivae and EOM are normal. Pupils are equal, round, and reactive to light. Right eye exhibits no discharge. Left eye exhibits no discharge. No scleral icterus.  Neck: Normal range of motion. Neck supple. Carotid bruit is not present. No tracheal deviation present. No thyromegaly present.  Cardiovascular: Normal rate, regular rhythm, normal heart  sounds and intact distal pulses.  Exam reveals no gallop.   Pulmonary/Chest: Effort normal and breath sounds normal. No respiratory distress. She has no wheezes. She has no rales.  No crackles   Abdominal: Soft. Bowel sounds are normal. She exhibits no distension, no abdominal bruit and no mass. There is tenderness in the epigastric area.  Musculoskeletal: She exhibits no edema and no tenderness.  Lymphadenopathy:    She has no cervical adenopathy.  Neurological: She is alert. She has normal reflexes. She displays no atrophy and no tremor. No cranial nerve deficit. She exhibits normal muscle tone. Coordination and gait normal.  No cerebellar signs Does get briefly dizzy when  sitting from lying down with 10 pt drop in systolic bp and no change in pulse   Skin: Skin is warm and dry. No rash noted. No erythema. No pallor.  Psychiatric: Her behavior is normal. Her mood appears anxious. Her speech is tangential. Thought content is not paranoid. Cognition and memory are impaired. She does not exhibit a depressed mood. She exhibits abnormal recent memory.          Assessment & Plan:

## 2013-05-29 NOTE — Patient Instructions (Addendum)
We will refer to GI at check out  Make sure to discuss her ER imaging study with splenic artery aneurysm  Cut the lopressor to 1/2 pill twice daily and let me know if the dizziness episodes do not stop  Return in 2 days to have PPD read  I will get paperwork done for you

## 2013-05-30 NOTE — Assessment & Plan Note (Signed)
Ref to GI Has GERD Also new 1.4 cm splenic art aneurysm-doubt symptomatic but will ask for opinion

## 2013-05-30 NOTE — Assessment & Plan Note (Signed)
This may play a role in transient pos dizziness Cut metoprolol in 1/2 If no resolution in symptoms then she may need cariol eval for rhythm disturbance

## 2013-05-30 NOTE — Assessment & Plan Note (Signed)
With more dysphagia even on dexilant  At risk for aspiration  Ref to GI may need esoph dilatation again

## 2013-05-30 NOTE — Assessment & Plan Note (Signed)
Pt will disc with GI  No symptoms currently Incidental finding

## 2013-05-31 LAB — TB SKIN TEST
Induration: 0 mm
TB Skin Test: NEGATIVE

## 2013-06-05 ENCOUNTER — Encounter: Payer: Self-pay | Admitting: Gastroenterology

## 2013-06-05 ENCOUNTER — Ambulatory Visit (INDEPENDENT_AMBULATORY_CARE_PROVIDER_SITE_OTHER): Payer: Medicare Other | Admitting: Gastroenterology

## 2013-06-05 VITALS — BP 120/80 | HR 64 | Ht 62.0 in | Wt 181.1 lb

## 2013-06-05 DIAGNOSIS — R1319 Other dysphagia: Secondary | ICD-10-CM | POA: Diagnosis not present

## 2013-06-05 DIAGNOSIS — F0390 Unspecified dementia without behavioral disturbance: Secondary | ICD-10-CM

## 2013-06-05 DIAGNOSIS — K219 Gastro-esophageal reflux disease without esophagitis: Secondary | ICD-10-CM

## 2013-06-05 MED ORDER — DEXLANSOPRAZOLE 60 MG PO CPDR: 60.0000 mg | DELAYED_RELEASE_CAPSULE | Freq: Every day | ORAL | Status: DC

## 2013-06-05 MED ORDER — HYOSCYAMINE SULFATE 0.125 MG SL SUBL: 0.1250 mg | SUBLINGUAL_TABLET | SUBLINGUAL | Status: DC | PRN

## 2013-06-05 NOTE — Progress Notes (Signed)
This is a 79 year old Caucasian female with recurrent dysphagia for solids and liquids in her mid substernal area.  These episodes seem to be precipitated by stressful events, and previous GI evaluations are been unremarkable including barium swallow and endoscopic exams.  She was dilated in December of 2011 with transitory improvement.  She continues to complain of a sharp periodic spasmodic-type pain in her mid substernal area, apparently is had a negative cardiac evaluation a negative recent upper abdominal ultrasound exam except for small 1.6 cm splenic artery aneurysm.  The patient denies hepatobiliary or lower gastrointestinal problems.  She does take Dexilant 60 mg a day and continues with some regurgitation and hiccups problems.  Despite these complaints, she's had no anorexia or weight loss.  She apparently has occasional incontinency of urine and stool.  Her dementia seems to be progressive, and she is followed closely by primary care.  CBC and metabolic profiles are been normal.  Current Medications, Allergies, Past Medical History, Past Surgical History, Family History and Social History were reviewed in [employer] record.  ROS: All systems were reviewed and are negative unless otherwise stated in the HPI.          Physical Exam: Unreliable historian but oriented x3.  At pressure 120/80, pulse 64 and regular and weight 181.  I cannot appreciate neck masses.  Her chest is clear she is in a regular rhythm without murmurs gallops or rubs.  Abdominal exam is unremarkable.  He is ambulatory without focal gross neurologic deficits.    Assessment and Plan: Probable chronic recurrent GERD with peptic stricture the esophagus, rule out esophageal motility disturbance.  I've scheduled her for endoscopy with propofol sedation, empiric dilation, esophageal biopsies, and also esophageal manometry.  We will continue an antireflux regime along with Dexilant 60 mg every morning and  add Zantac 150 mg at bedtime with when necessary sublingual Levsin use for esophageal spasms.  Speaking with her daughter was present throughout the exam and interview, it is obvious that some of her symptoms are related to her dementia and stressful situations. No diagnosis found.

## 2013-06-05 NOTE — Patient Instructions (Addendum)
You have been scheduled for an endoscopy with propofol. Please follow written instructions given to you at your visit today. If you use inhalers (even only as needed), please bring them with you on the day of your procedure. Your physician has requested that you go to www.startemmi.com and enter the access code given to you at your visit today. This web site gives a general overview about your procedure. However, you should still follow specific instructions given to you by our office regarding your preparation for the procedure.  We have sent the following medications to your pharmacy for you to pick up at your convenience: Levsin 0.125 mg, please take as directed  New prescription for Dexliant was sent to your pharmacy.  Please purchase Zantac over the counter and take at bedtime. __________________________________________________________________________________________________________________________________________________________________________________  Bonita Quin have been scheduled for an esophageal manometry at [facility] on 06-24-2013 at 8 am. Please arrive 30 minutes prior to your procedure for registration. You will need to go to outpatient registration (1st floor of the hospital) first. Make certain to bring your insurance cards as well as a complete list of medications.  Please remember the following:  1) Nothing to eat or drink after 12:00 midnight on the night before your test.  2) Hold all diabetic medications/insulin the morning of the test. You may eat and take your medications after the test.  3) For 3 days prior to your test do not take: Dexilant, Prevacid, Nexium, Protonix, Aciphex, Zegerid, Pantoprazole, Prilosec or omeprazole.  4) For 2 days prior to your test, do not take: Reglan, Tagamet, Zantac, Axid or Pepcid.  5) You MAY use an antacid such as Rolaids or Tums up to 12 hours prior to your test.  It will take at least 2 weeks to receive the results of this  test from your physician. ------------------------------------------ ABOUT ESOPHAGEAL MANOMETRY Esophageal manometry (muh-NOM-uh-tree) is a test that gauges how well your esophagus works. Your esophagus is the long, muscular tube that connects your throat to your stomach. Esophageal manometry measures the rhythmic muscle contractions (peristalsis) that occur in your esophagus when you swallow. Esophageal manometry also measures the coordination and force exerted by the muscles of your esophagus.  During esophageal manometry, a thin, flexible tube (catheter) that contains sensors is passed through your nose, down your esophagus and into your stomach. Esophageal manometry can be helpful in diagnosing some mostly uncommon disorders that affect your esophagus.  Why it's done Esophageal manometry is used to evaluate the movement (motility) of food through the esophagus and into the stomach. The test measures how well the circular bands of muscle (sphincters) at the top and bottom of your esophagus open and close, as well as the pressure, strength and pattern of the wave of esophageal muscle contractions that moves food along.  What you can expect Esophageal manometry is an outpatient procedure done without sedation. Most people tolerate it well. You may be asked to change into a hospital gown before the test starts.  During esophageal manometry  While you are sitting up, a member of your health care team sprays your throat with a numbing medication or puts numbing gel in your nose or both.  A catheter is guided through your nose into your esophagus. The catheter may be sheathed in a water-filled sleeve. It doesn't interfere with your breathing. However, your eyes may water, and you may gag. You may have a slight nosebleed from irritation.  After the catheter is in place, you may be asked to lie  on your back on an exam table, or you may be asked to remain seated.  You then swallow small sips of water. As you  do, a computer connected to the catheter records the pressure, strength and pattern of your esophageal muscle contractions.  During the test, you'll be asked to breathe slowly and smoothly, remain as still as possible, and swallow only when you're asked to do so.  A member of your health care team may move the catheter down into your stomach while the catheter continues its measurements.  The catheter then is slowly withdrawn. The test usually lasts 20 to 30 minutes.  After esophageal manometry  When your esophageal manometry is complete, you may return to your normal activities  This test typically takes 30-45 minutes to complete. ________________________________________________________________________________                                                 We are excited to introduce MyChart, a new best-in-class service that provides you online access to important information in your electronic medical record. We want to make it easier for you to view your health information - all in one secure location - when and where you need it. We expect MyChart will enhance the quality of care and service we provide.  When you register for MyChart, you can:    View your test results.    Request appointments and receive appointment reminders via email.    Request medication renewals.    View your medical history, allergies, medications and immunizations.    Communicate with your physician's office through a password-protected site.    Conveniently print information such as your medication lists.  To find out if MyChart is right for you, please talk to a member of our clinical staff today. We will gladly answer your questions about this free health and wellness tool.  If you are age 77 or older and want a member of your family to have access to your record, you must provide written consent by completing a proxy form available at our office. Please speak to our clinical staff about guidelines  regarding accounts for patients younger than age 55.  As you activate your MyChart account and need any technical assistance, please call the MyChart technical support line at [phone]-CHART [phone]) or email your question to [email or web address]. If you email your question(s), please include your name, a return phone number and the best time to reach you.  If you have non-urgent health-related questions, you can send a message to our office through MyChart at [email or web address]. If you have a medical emergency, call 911.  Thank you for using MyChart as your new health and wellness resource!   MyChart licensed from [employer],  2130-8657. Patents Pending.

## 2013-06-17 ENCOUNTER — Telehealth: Payer: Self-pay | Admitting: Family Medicine

## 2013-06-17 NOTE — Telephone Encounter (Signed)
The diagnosis was dementia with behavioral distrubance (that does not mean mental illness at all)- but I will change that to dementia plain  It will show on her snap shot- but if you need me to write a letter I'm happy to do that

## 2013-06-17 NOTE — Telephone Encounter (Signed)
Patient's daughter-in-law called about assistant living papers you filled out.  The facility said the paperwork says you wrote something on the mental illness as depression, but they need a HP form submitted stating the Dementia is primary and not the mental illness. The fax # is [facility]  [phone].  Any questions please call.

## 2013-06-18 NOTE — Telephone Encounter (Signed)
Pt is going to call insurance in the am to see what form they need [place] to fill out or if they need a letter from Dr. Milinda Antis and call us back

## 2013-06-19 DIAGNOSIS — I10 Essential (primary) hypertension: Secondary | ICD-10-CM | POA: Diagnosis not present

## 2013-06-19 DIAGNOSIS — N2581 Secondary hyperparathyroidism of renal origin: Secondary | ICD-10-CM | POA: Diagnosis not present

## 2013-06-21 NOTE — Telephone Encounter (Signed)
Letter faxed to [facility].  See scanned documents.

## 2013-06-21 NOTE — Telephone Encounter (Signed)
Lawson Radar called back to ck on status of paper work; I advised was faxed to [facility] today at 4:56; Margaret Hicks very appreciative. I also apologized to [place] that I did not get v/m on 06/20/13.

## 2013-06-21 NOTE — Telephone Encounter (Signed)
Pt's daughter, Lawson Radar, called to say that [facility] told her that has not received the Health and Progress form OR letter from Dr. Milinda Antis saying pt's primary problem is dementia and combativeness is a bi-product of the dementia. She said this will keep pt from being admitted if they don't receive this paperwork today.  Pt's daughter said she left a message on Rena's vm yesterday.

## 2013-06-21 NOTE — Telephone Encounter (Signed)
Letter is done and in IN box- let me know if you need anything else.

## 2013-06-23 ENCOUNTER — Other Ambulatory Visit: Payer: Self-pay | Admitting: Family Medicine

## 2013-07-08 ENCOUNTER — Encounter (HOSPITAL_COMMUNITY): Admission: RE | Payer: Self-pay | Source: Ambulatory Visit

## 2013-07-08 ENCOUNTER — Ambulatory Visit (HOSPITAL_COMMUNITY): Admission: RE | Admit: 2013-07-08 | Payer: Medicare Other | Source: Ambulatory Visit | Admitting: Gastroenterology

## 2013-07-08 SURGERY — MANOMETRY, ESOPHAGUS
Anesthesia: Topical

## 2013-07-15 ENCOUNTER — Encounter: Payer: Medicare Other | Admitting: Gastroenterology

## 2013-07-19 IMAGING — CR DG ABDOMEN 2V
2 series · 2 of 2 positions shown · non-contrast
Comparison: None.

CLINICAL DATA: Pain.

ABDOMEN - 2 VIEW

[w abdomen upright]
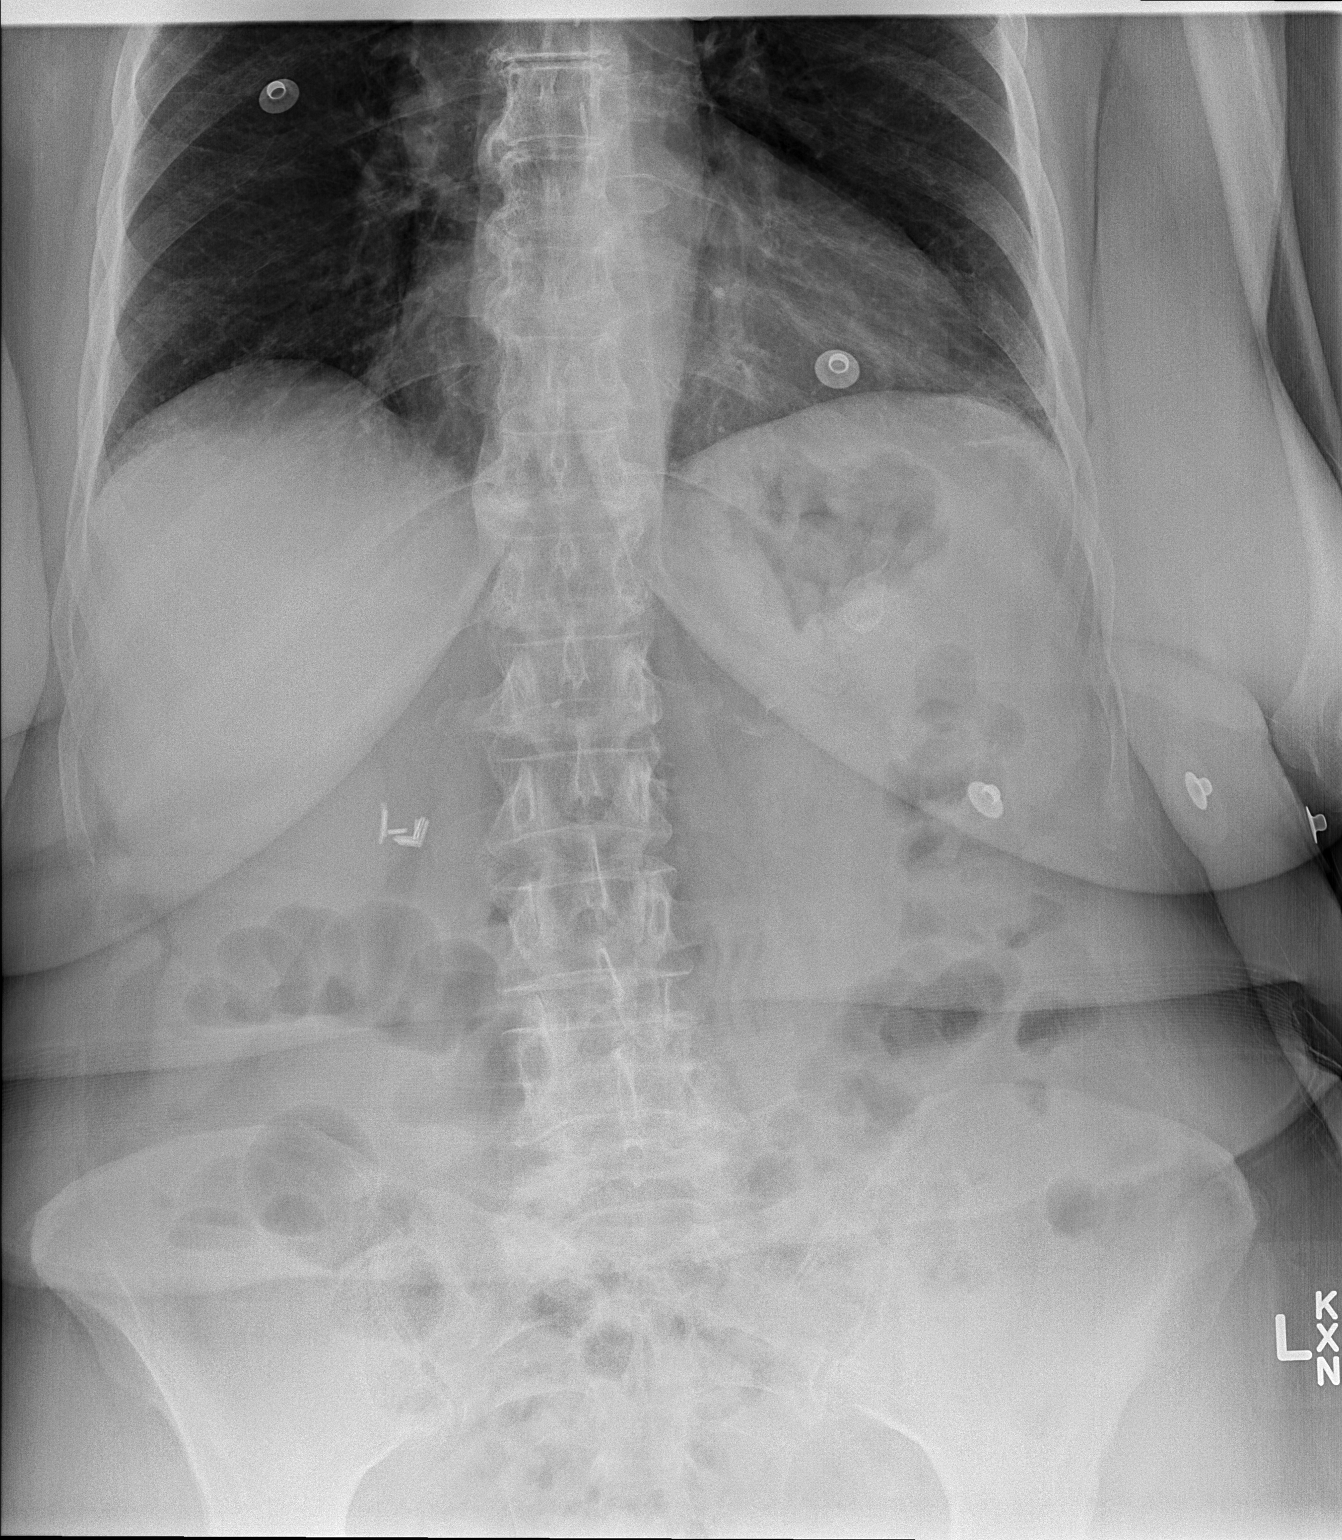

[t abdomen supine]
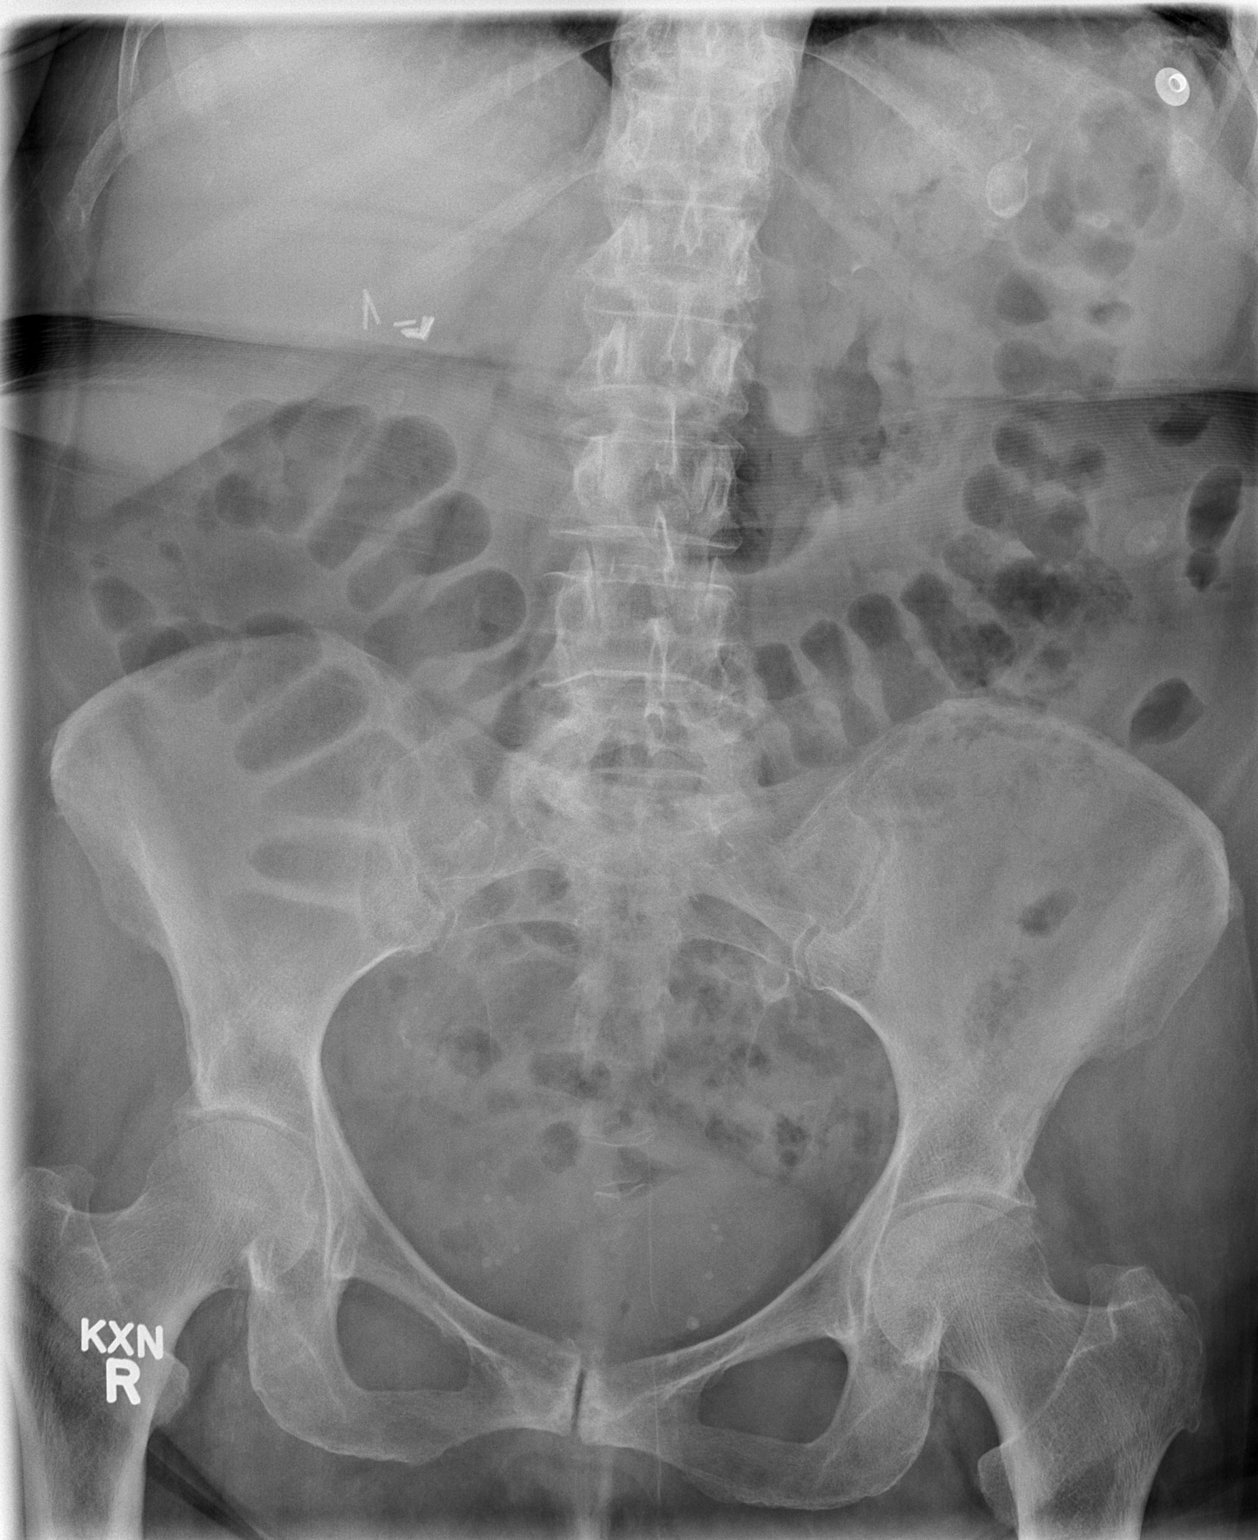

[2 of 2 positions shown; findings below may reference images not displayed]

FINDINGS: No evidence for free air on the upright view.  Surgical
clips in the right upper abdomen.  Vascular calcifications in the
left upper quadrant are probably related to the splenic artery.
Findings raise the possibility of a 1.6 cm splenic artery aneurysm.
Nonspecific bowel gas pattern.  There is gas within the small and
large bowel.  Degenerative changes at the pubic symphysis.
IMPRESSION: Nonspecific bowel gas pattern.

Probable 1.6 cm splenic artery aneurysm.

## 2013-07-19 IMAGING — CR DG CHEST 2V
1 series · 1 of 1 positions shown · non-contrast
Comparison: 11/12/2011

CLINICAL DATA: Pain.

CHEST - 2 VIEW

[w chest lat]
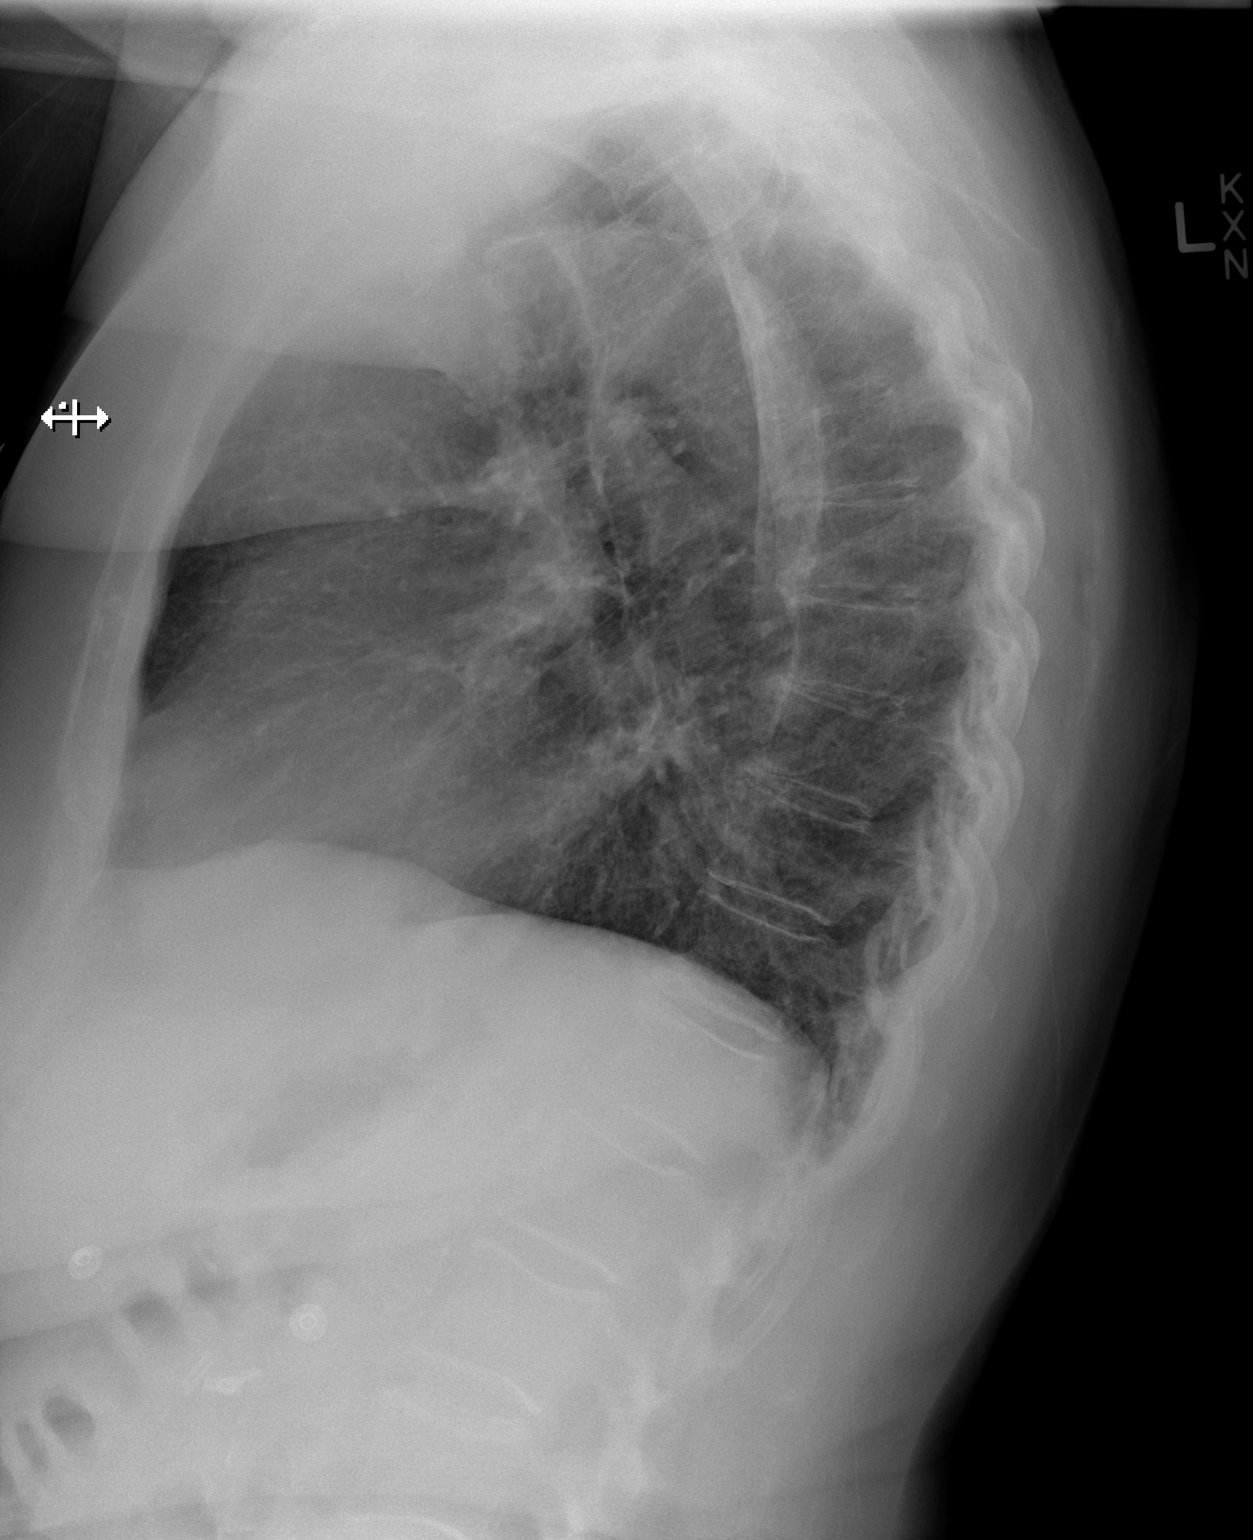

[1 of 1 positions shown; findings below may reference images not displayed]

FINDINGS: Coarse lung markings appear to be chronic.  The aortic
arch is tortuous and partially calcified.  No focal lung disease.
Heart size is normal.
IMPRESSION: No acute chest findings.

## 2013-07-24 DIAGNOSIS — M7750 Other enthesopathy of unspecified foot: Secondary | ICD-10-CM | POA: Diagnosis not present

## 2013-07-24 DIAGNOSIS — L84 Corns and callosities: Secondary | ICD-10-CM | POA: Diagnosis not present

## 2013-07-29 DIAGNOSIS — B351 Tinea unguium: Secondary | ICD-10-CM | POA: Diagnosis not present

## 2013-07-29 DIAGNOSIS — M19079 Primary osteoarthritis, unspecified ankle and foot: Secondary | ICD-10-CM | POA: Diagnosis not present

## 2013-07-29 DIAGNOSIS — L609 Nail disorder, unspecified: Secondary | ICD-10-CM | POA: Diagnosis not present

## 2013-07-29 DIAGNOSIS — M25579 Pain in unspecified ankle and joints of unspecified foot: Secondary | ICD-10-CM | POA: Diagnosis not present

## 2013-08-12 ENCOUNTER — Encounter: Payer: Self-pay | Admitting: Family Medicine

## 2013-08-12 ENCOUNTER — Ambulatory Visit (INDEPENDENT_AMBULATORY_CARE_PROVIDER_SITE_OTHER): Payer: Medicare Other | Admitting: Family Medicine

## 2013-08-12 VITALS — BP 126/70 | HR 71 | Temp 98.7°F | Ht 61.0 in | Wt 187.2 lb

## 2013-08-12 DIAGNOSIS — L84 Corns and callosities: Secondary | ICD-10-CM

## 2013-08-12 DIAGNOSIS — I10 Essential (primary) hypertension: Secondary | ICD-10-CM

## 2013-08-12 DIAGNOSIS — R609 Edema, unspecified: Secondary | ICD-10-CM

## 2013-08-12 DIAGNOSIS — N259 Disorder resulting from impaired renal tubular function, unspecified: Secondary | ICD-10-CM

## 2013-08-12 DIAGNOSIS — R600 Localized edema: Secondary | ICD-10-CM

## 2013-08-12 LAB — COMPREHENSIVE METABOLIC PANEL
ALT: 13 U/L (ref 0–35)
CO2: 27 mEq/L (ref 19–32)
Calcium: 9.3 mg/dL (ref 8.4–10.5)
Chloride: 105 mEq/L (ref 96–112)
Creatinine, Ser: 1.3 mg/dL — ABNORMAL HIGH (ref 0.4–1.2)
GFR: 43.48 mL/min — ABNORMAL LOW (ref >60.00)
Glucose, Bld: 123 mg/dL — ABNORMAL HIGH (ref 70–99)
Total Bilirubin: 0.6 mg/dL (ref 0.3–1.2)

## 2013-08-12 NOTE — Assessment & Plan Note (Signed)
Ongoing and pre ulcerated - likely from repedative trauma of AFO and shoe in setting of pes planus No signs of infx Callus slightly pared down today with some relief and dressed with abx oint Ref to podiatry for 2nd op- and consult about shoe wear

## 2013-08-12 NOTE — Assessment & Plan Note (Signed)
Without cardiac symptoms Reassuring exam Diet has changed and sitting more req low sodium diet and enc to elevate legs  Lab today - pt has hx of renal insuff

## 2013-08-12 NOTE — Patient Instructions (Addendum)
Stop at check out for referral to podiatry Elevate feet when able  Put triple antibiotic on callus and keep it clean  Avoid sodium in diet and no more sodas  Labs today  If swelling worsens- let me know

## 2013-08-12 NOTE — Progress Notes (Signed)
Subjective:    Patient ID: Margaret Hicks, female    DOB: 02/27/1932, 79 y.o.   MRN: [id number]  HPI Here with R foot problem   Saw podiatry about it -no help  Has a callus and some oozing under that  Thinks the brace is causing the problem    More swelling in legs also  Wants to re check her kidney fxn Has CKD 3 - and saw renal in aug  Last cr 1.37- was doing better   Has been getting some sodas at the nursing home - family unaware  Wt is up 6 lb  Getting lots of snacks/ empty calories ? About salt   Patient Active Problem List   Diagnosis Date Noted  . Orthostatic hypotension 05/29/2013  . Dysphagia, unspecified(787.20) 05/29/2013  . Aneurysm, splenic artery 05/29/2013  . Dementia 01/09/2013  . Back pain, thoracic 11/28/2012  . Depression 11/28/2012  . Other screening mammogram 11/28/2012  . Vertigo 11/18/2011  . EXTERNAL HEMORRHOIDS 01/26/2011  . GERD 09/01/2010  . HYPOTHYROIDISM 05/27/2010  . HYPERLIPIDEMIA 05/27/2010  . HYPERTENSION 05/27/2010  . RENAL INSUFFICIENCY 05/27/2010  . Unspecified urinary incontinence 05/27/2010  . UTI'S, HX OF 05/27/2010   Past Medical History  Diagnosis Date  . Hypothyroidism   . Urinary incontinence   . Hypertension   . Arthritis   . Esophageal reflux   . Obesity   . Renal insufficiency   . OP (osteoporosis)   . TIA (transient ischemic attack)   . Upper GI bleed   . Hyperlipemia   . Hemorrhoids   . Acute gastritis   . Schatzki's ring    Past Surgical History  Procedure Laterality Date  . Cardiac catheterization    . Cholecystectomy    . Tonsillectomy    . Abdominal hysterectomy     History  Substance Use Topics  . Smoking status: Never Smoker   . Smokeless tobacco: Never Used  . Alcohol Use: No   Family History  Problem Relation Age of Onset  . Dementia Mother   . Stroke Mother   . Arthritis Sister   . Dementia Brother   . Cancer      nephew  . Colon cancer Neg Hx   . Heart disease Father   . Dementia  Brother    Allergies  Allergen Reactions  . Simvastatin     REACTION: muscle pain/ memory issues   Current Outpatient Prescriptions on File Prior to Visit  Medication Sig Dispense Refill  . amLODipine-valsartan (EXFORGE) 5-320 MG per tablet Take 1 tablet by mouth daily.  90 tablet  3  . cholecalciferol (VITAMIN D) 1000 UNITS tablet Take 1,000 Units by mouth every evening.       Marland Kitchen dexlansoprazole (DEXILANT) 60 MG capsule Take 1 capsule (60 mg total) by mouth daily.  90 capsule  3  . docusate sodium (COLACE) 50 MG capsule Take by mouth daily.      . Flaxseed, Linseed, (FLAX SEED OIL) 1000 MG CAPS Take 1 capsule by mouth 2 (two) times daily.        . hydrocortisone (ANUSOL-HC) 2.5 % rectal cream Place 1 application rectally 2 (two) times daily as needed for hemorrhoids.      Marland Kitchen levothyroxine (SYNTHROID, LEVOTHROID) 100 MCG tablet TAKE 1 TABLET BY MOUTH EVERY DAY  90 tablet  1  . lidocaine (XYLOCAINE) 5 % ointment Apply 1 application topically as needed (for rectal pain).      . metoprolol (LOPRESSOR) 100 MG tablet Take 50  mg by mouth 2 (two) times daily.      . Multiple Vitamin (MULTIVITAMIN) capsule Take 1 capsule by mouth daily.        . Probiotic Product (PROBIOTIC FORMULA PO) Take by mouth daily.       . sertraline (ZOLOFT) 50 MG tablet Take 1 tablet (50 mg total) by mouth daily.  90 tablet  3  . spironolactone (ALDACTONE) 25 MG tablet Take 0.5 tablets (12.5 mg total) by mouth daily.  45 tablet  3   No current facility-administered medications on file prior to visit.      Review of Systems Review of Systems  Constitutional: Negative for fever, appetite change, fatigue and unexpected weight change.  Eyes: Negative for pain and visual disturbance.  Respiratory: Negative for cough and shortness of breath.   Cardiovascular: Negative for cp or palpitations   neg for PND or orthopnea  Gastrointestinal: Negative for nausea, diarrhea and constipation.  Genitourinary: Negative for urgency  and frequency.  Skin: Negative for pallor or rash  neg for redness at callus site  Neurological: Negative for weakness, light-headedness, numbness and headaches.  Hematological: Negative for adenopathy. Does not bruise/bleed easily.  Psychiatric/Behavioral: Negative for dysphoric mood. The patient is anxious at times and has dementia         Objective:   Physical Exam  Constitutional: She appears well-developed and well-nourished. No distress.  obese and well appearing   HENT:  Head: Normocephalic and atraumatic.  Eyes: Conjunctivae and EOM are normal. Pupils are equal, round, and reactive to light.  Neck: Normal range of motion. Neck supple.  Cardiovascular: Normal rate and regular rhythm.   Musculoskeletal: She exhibits edema.  Trace to 1 plus pedal edema to mid calf without tenderness or palp cords     Lymphadenopathy:    She has no cervical adenopathy.  Skin: Skin is warm and dry. No rash noted. No erythema. No pallor.  1.5  Cm oval callus on L med foot - with pre ulcerative features but no s/s of infection and no drainage  Pt has severe ped planus and can see where her AFO and shoe rub this area   This was slightly pared down/ dressed and abx oint applied   Psychiatric: She has a normal mood and affect. Cognition and memory are impaired. She exhibits abnormal recent memory.  Relatively cheerful today and repeats herself often          Assessment & Plan:

## 2013-08-12 NOTE — Assessment & Plan Note (Signed)
Watched by renal Last cr 1.37 Re check today in setting of inc leg swelling

## 2013-08-12 NOTE — Assessment & Plan Note (Signed)
bp in fair control at this time  No changes needed  Disc lifstyle change with low sodium diet and exercise  Lab today 

## 2013-08-15 ENCOUNTER — Telehealth: Payer: Self-pay

## 2013-08-15 ENCOUNTER — Encounter: Payer: Self-pay | Admitting: *Deleted

## 2013-08-15 NOTE — Telephone Encounter (Signed)
Solmon Ice, RN a Sports coach with [facility] saw pt today and at the facility pt is staying there in no clinician; there are CNAs and med techs. Tanya request verbal order for evaluation by home health nurse. Kenney Houseman is concerned about swelling in pts lower legs and callus on foot. Please advise.

## 2013-08-15 NOTE — Telephone Encounter (Signed)
I saw her several days ago in the office to discuss these issues -- we referred her to podiatry for the foot issue- does her family feel she needs the home care nurse referral

## 2013-08-16 NOTE — Telephone Encounter (Signed)
Left voicemail giving Kenney Houseman the verbal order for home health nurse eval

## 2013-08-16 NOTE — Telephone Encounter (Signed)
Spoke with Kenney Houseman and she just wants a Home health nurse to assist pt for a short time while she is getting her foot and leg issues resolved, nurse doesn't feel like it's enough help around the nursing home with CNAs and med techs and Kenney Houseman just wants to make sure that pt's foot and legs don't get neglected since she is having problems with them. Kenney Houseman wanted you to be aware that you saw pt on 08/12/13 and CNAs were not even aware that she had an appt with you, and your new orders for pt didn't even get implemented until yesterday because CNAs didn't realize she had new orders, that concerned Tanya enough to want an verbal order for evaluation for home health nurse so pt will have "another set of eyes on her" while she is at the nursing home, please advise

## 2013-08-16 NOTE — Telephone Encounter (Signed)
Kenney Houseman called back to see if can get home health nurse evaluation order.

## 2013-08-16 NOTE — Telephone Encounter (Signed)
Please go ahead and verbally ok that as long as family is aware and supportive

## 2013-08-18 DIAGNOSIS — G8929 Other chronic pain: Secondary | ICD-10-CM | POA: Diagnosis not present

## 2013-08-18 DIAGNOSIS — Z4800 Encounter for change or removal of nonsurgical wound dressing: Secondary | ICD-10-CM | POA: Diagnosis not present

## 2013-08-18 DIAGNOSIS — N189 Chronic kidney disease, unspecified: Secondary | ICD-10-CM | POA: Diagnosis not present

## 2013-08-18 DIAGNOSIS — M545 Low back pain: Secondary | ICD-10-CM | POA: Diagnosis not present

## 2013-08-18 DIAGNOSIS — K219 Gastro-esophageal reflux disease without esophagitis: Secondary | ICD-10-CM | POA: Diagnosis not present

## 2013-08-18 DIAGNOSIS — R609 Edema, unspecified: Secondary | ICD-10-CM | POA: Diagnosis not present

## 2013-08-18 DIAGNOSIS — I129 Hypertensive chronic kidney disease with stage 1 through stage 4 chronic kidney disease, or unspecified chronic kidney disease: Secondary | ICD-10-CM | POA: Diagnosis not present

## 2013-08-18 DIAGNOSIS — L84 Corns and callosities: Secondary | ICD-10-CM | POA: Diagnosis not present

## 2013-08-18 DIAGNOSIS — Z9181 History of falling: Secondary | ICD-10-CM | POA: Diagnosis not present

## 2013-08-18 DIAGNOSIS — F0390 Unspecified dementia without behavioral disturbance: Secondary | ICD-10-CM | POA: Diagnosis not present

## 2013-08-22 DIAGNOSIS — K219 Gastro-esophageal reflux disease without esophagitis: Secondary | ICD-10-CM | POA: Diagnosis not present

## 2013-08-22 DIAGNOSIS — I129 Hypertensive chronic kidney disease with stage 1 through stage 4 chronic kidney disease, or unspecified chronic kidney disease: Secondary | ICD-10-CM | POA: Diagnosis not present

## 2013-08-22 DIAGNOSIS — L84 Corns and callosities: Secondary | ICD-10-CM | POA: Diagnosis not present

## 2013-08-22 DIAGNOSIS — R609 Edema, unspecified: Secondary | ICD-10-CM | POA: Diagnosis not present

## 2013-08-22 DIAGNOSIS — F0390 Unspecified dementia without behavioral disturbance: Secondary | ICD-10-CM | POA: Diagnosis not present

## 2013-08-22 DIAGNOSIS — N189 Chronic kidney disease, unspecified: Secondary | ICD-10-CM | POA: Diagnosis not present

## 2013-08-23 DIAGNOSIS — L84 Corns and callosities: Secondary | ICD-10-CM

## 2013-08-23 DIAGNOSIS — I129 Hypertensive chronic kidney disease with stage 1 through stage 4 chronic kidney disease, or unspecified chronic kidney disease: Secondary | ICD-10-CM

## 2013-08-23 DIAGNOSIS — N189 Chronic kidney disease, unspecified: Secondary | ICD-10-CM

## 2013-08-23 DIAGNOSIS — R609 Edema, unspecified: Secondary | ICD-10-CM

## 2013-08-26 DIAGNOSIS — I129 Hypertensive chronic kidney disease with stage 1 through stage 4 chronic kidney disease, or unspecified chronic kidney disease: Secondary | ICD-10-CM | POA: Diagnosis not present

## 2013-08-26 DIAGNOSIS — N189 Chronic kidney disease, unspecified: Secondary | ICD-10-CM | POA: Diagnosis not present

## 2013-08-26 DIAGNOSIS — F0390 Unspecified dementia without behavioral disturbance: Secondary | ICD-10-CM | POA: Diagnosis not present

## 2013-08-26 DIAGNOSIS — K219 Gastro-esophageal reflux disease without esophagitis: Secondary | ICD-10-CM | POA: Diagnosis not present

## 2013-08-26 DIAGNOSIS — R609 Edema, unspecified: Secondary | ICD-10-CM | POA: Diagnosis not present

## 2013-08-26 DIAGNOSIS — L84 Corns and callosities: Secondary | ICD-10-CM | POA: Diagnosis not present

## 2013-08-29 DIAGNOSIS — M79609 Pain in unspecified limb: Secondary | ICD-10-CM | POA: Diagnosis not present

## 2013-08-29 DIAGNOSIS — M76829 Posterior tibial tendinitis, unspecified leg: Secondary | ICD-10-CM | POA: Diagnosis not present

## 2013-08-29 DIAGNOSIS — L97509 Non-pressure chronic ulcer of other part of unspecified foot with unspecified severity: Secondary | ICD-10-CM | POA: Diagnosis not present

## 2013-08-30 DIAGNOSIS — N189 Chronic kidney disease, unspecified: Secondary | ICD-10-CM | POA: Diagnosis not present

## 2013-08-30 DIAGNOSIS — K219 Gastro-esophageal reflux disease without esophagitis: Secondary | ICD-10-CM | POA: Diagnosis not present

## 2013-08-30 DIAGNOSIS — L84 Corns and callosities: Secondary | ICD-10-CM | POA: Diagnosis not present

## 2013-08-30 DIAGNOSIS — R609 Edema, unspecified: Secondary | ICD-10-CM | POA: Diagnosis not present

## 2013-08-30 DIAGNOSIS — I129 Hypertensive chronic kidney disease with stage 1 through stage 4 chronic kidney disease, or unspecified chronic kidney disease: Secondary | ICD-10-CM | POA: Diagnosis not present

## 2013-08-30 DIAGNOSIS — F0390 Unspecified dementia without behavioral disturbance: Secondary | ICD-10-CM | POA: Diagnosis not present

## 2013-09-02 DIAGNOSIS — N189 Chronic kidney disease, unspecified: Secondary | ICD-10-CM | POA: Diagnosis not present

## 2013-09-02 DIAGNOSIS — R609 Edema, unspecified: Secondary | ICD-10-CM | POA: Diagnosis not present

## 2013-09-02 DIAGNOSIS — F0390 Unspecified dementia without behavioral disturbance: Secondary | ICD-10-CM | POA: Diagnosis not present

## 2013-09-02 DIAGNOSIS — I129 Hypertensive chronic kidney disease with stage 1 through stage 4 chronic kidney disease, or unspecified chronic kidney disease: Secondary | ICD-10-CM | POA: Diagnosis not present

## 2013-09-02 DIAGNOSIS — L84 Corns and callosities: Secondary | ICD-10-CM | POA: Diagnosis not present

## 2013-09-02 DIAGNOSIS — K219 Gastro-esophageal reflux disease without esophagitis: Secondary | ICD-10-CM | POA: Diagnosis not present

## 2013-09-05 DIAGNOSIS — F0390 Unspecified dementia without behavioral disturbance: Secondary | ICD-10-CM | POA: Diagnosis not present

## 2013-09-05 DIAGNOSIS — N189 Chronic kidney disease, unspecified: Secondary | ICD-10-CM | POA: Diagnosis not present

## 2013-09-05 DIAGNOSIS — K219 Gastro-esophageal reflux disease without esophagitis: Secondary | ICD-10-CM | POA: Diagnosis not present

## 2013-09-05 DIAGNOSIS — R609 Edema, unspecified: Secondary | ICD-10-CM | POA: Diagnosis not present

## 2013-09-05 DIAGNOSIS — L84 Corns and callosities: Secondary | ICD-10-CM | POA: Diagnosis not present

## 2013-09-05 DIAGNOSIS — I129 Hypertensive chronic kidney disease with stage 1 through stage 4 chronic kidney disease, or unspecified chronic kidney disease: Secondary | ICD-10-CM | POA: Diagnosis not present

## 2013-09-09 DIAGNOSIS — F0390 Unspecified dementia without behavioral disturbance: Secondary | ICD-10-CM | POA: Diagnosis not present

## 2013-09-09 DIAGNOSIS — L84 Corns and callosities: Secondary | ICD-10-CM | POA: Diagnosis not present

## 2013-09-09 DIAGNOSIS — I129 Hypertensive chronic kidney disease with stage 1 through stage 4 chronic kidney disease, or unspecified chronic kidney disease: Secondary | ICD-10-CM | POA: Diagnosis not present

## 2013-09-09 DIAGNOSIS — N189 Chronic kidney disease, unspecified: Secondary | ICD-10-CM | POA: Diagnosis not present

## 2013-09-09 DIAGNOSIS — K219 Gastro-esophageal reflux disease without esophagitis: Secondary | ICD-10-CM | POA: Diagnosis not present

## 2013-09-09 DIAGNOSIS — R609 Edema, unspecified: Secondary | ICD-10-CM | POA: Diagnosis not present

## 2013-09-13 DIAGNOSIS — N189 Chronic kidney disease, unspecified: Secondary | ICD-10-CM | POA: Diagnosis not present

## 2013-09-13 DIAGNOSIS — L84 Corns and callosities: Secondary | ICD-10-CM | POA: Diagnosis not present

## 2013-09-13 DIAGNOSIS — K219 Gastro-esophageal reflux disease without esophagitis: Secondary | ICD-10-CM | POA: Diagnosis not present

## 2013-09-13 DIAGNOSIS — I129 Hypertensive chronic kidney disease with stage 1 through stage 4 chronic kidney disease, or unspecified chronic kidney disease: Secondary | ICD-10-CM | POA: Diagnosis not present

## 2013-09-13 DIAGNOSIS — F0390 Unspecified dementia without behavioral disturbance: Secondary | ICD-10-CM | POA: Diagnosis not present

## 2013-09-13 DIAGNOSIS — R609 Edema, unspecified: Secondary | ICD-10-CM | POA: Diagnosis not present

## 2013-09-17 ENCOUNTER — Other Ambulatory Visit: Payer: Self-pay | Admitting: Family Medicine

## 2013-09-17 DIAGNOSIS — I129 Hypertensive chronic kidney disease with stage 1 through stage 4 chronic kidney disease, or unspecified chronic kidney disease: Secondary | ICD-10-CM | POA: Diagnosis not present

## 2013-09-17 DIAGNOSIS — R609 Edema, unspecified: Secondary | ICD-10-CM | POA: Diagnosis not present

## 2013-09-17 DIAGNOSIS — K219 Gastro-esophageal reflux disease without esophagitis: Secondary | ICD-10-CM | POA: Diagnosis not present

## 2013-09-17 DIAGNOSIS — N189 Chronic kidney disease, unspecified: Secondary | ICD-10-CM | POA: Diagnosis not present

## 2013-09-17 DIAGNOSIS — F0390 Unspecified dementia without behavioral disturbance: Secondary | ICD-10-CM | POA: Diagnosis not present

## 2013-09-17 DIAGNOSIS — L84 Corns and callosities: Secondary | ICD-10-CM | POA: Diagnosis not present

## 2013-09-20 DIAGNOSIS — N189 Chronic kidney disease, unspecified: Secondary | ICD-10-CM | POA: Diagnosis not present

## 2013-09-20 DIAGNOSIS — R609 Edema, unspecified: Secondary | ICD-10-CM | POA: Diagnosis not present

## 2013-09-20 DIAGNOSIS — K219 Gastro-esophageal reflux disease without esophagitis: Secondary | ICD-10-CM | POA: Diagnosis not present

## 2013-09-20 DIAGNOSIS — I129 Hypertensive chronic kidney disease with stage 1 through stage 4 chronic kidney disease, or unspecified chronic kidney disease: Secondary | ICD-10-CM | POA: Diagnosis not present

## 2013-09-20 DIAGNOSIS — L84 Corns and callosities: Secondary | ICD-10-CM | POA: Diagnosis not present

## 2013-09-20 DIAGNOSIS — F0390 Unspecified dementia without behavioral disturbance: Secondary | ICD-10-CM | POA: Diagnosis not present

## 2013-09-24 DIAGNOSIS — L84 Corns and callosities: Secondary | ICD-10-CM | POA: Diagnosis not present

## 2013-09-24 DIAGNOSIS — N189 Chronic kidney disease, unspecified: Secondary | ICD-10-CM | POA: Diagnosis not present

## 2013-09-24 DIAGNOSIS — R609 Edema, unspecified: Secondary | ICD-10-CM | POA: Diagnosis not present

## 2013-09-24 DIAGNOSIS — I129 Hypertensive chronic kidney disease with stage 1 through stage 4 chronic kidney disease, or unspecified chronic kidney disease: Secondary | ICD-10-CM | POA: Diagnosis not present

## 2013-09-24 DIAGNOSIS — F0390 Unspecified dementia without behavioral disturbance: Secondary | ICD-10-CM | POA: Diagnosis not present

## 2013-09-24 DIAGNOSIS — K219 Gastro-esophageal reflux disease without esophagitis: Secondary | ICD-10-CM | POA: Diagnosis not present

## 2013-09-26 DIAGNOSIS — L97909 Non-pressure chronic ulcer of unspecified part of unspecified lower leg with unspecified severity: Secondary | ICD-10-CM | POA: Diagnosis not present

## 2013-09-30 DIAGNOSIS — L609 Nail disorder, unspecified: Secondary | ICD-10-CM | POA: Diagnosis not present

## 2013-09-30 DIAGNOSIS — M25579 Pain in unspecified ankle and joints of unspecified foot: Secondary | ICD-10-CM | POA: Diagnosis not present

## 2013-09-30 DIAGNOSIS — B351 Tinea unguium: Secondary | ICD-10-CM | POA: Diagnosis not present

## 2013-09-30 DIAGNOSIS — I70219 Atherosclerosis of native arteries of extremities with intermittent claudication, unspecified extremity: Secondary | ICD-10-CM | POA: Diagnosis not present

## 2013-10-02 DIAGNOSIS — F0390 Unspecified dementia without behavioral disturbance: Secondary | ICD-10-CM | POA: Diagnosis not present

## 2013-10-02 DIAGNOSIS — L84 Corns and callosities: Secondary | ICD-10-CM | POA: Diagnosis not present

## 2013-10-02 DIAGNOSIS — N189 Chronic kidney disease, unspecified: Secondary | ICD-10-CM | POA: Diagnosis not present

## 2013-10-02 DIAGNOSIS — I129 Hypertensive chronic kidney disease with stage 1 through stage 4 chronic kidney disease, or unspecified chronic kidney disease: Secondary | ICD-10-CM | POA: Diagnosis not present

## 2013-10-02 DIAGNOSIS — R609 Edema, unspecified: Secondary | ICD-10-CM | POA: Diagnosis not present

## 2013-10-02 DIAGNOSIS — K219 Gastro-esophageal reflux disease without esophagitis: Secondary | ICD-10-CM | POA: Diagnosis not present

## 2013-10-10 DIAGNOSIS — N189 Chronic kidney disease, unspecified: Secondary | ICD-10-CM | POA: Diagnosis not present

## 2013-10-10 DIAGNOSIS — F0390 Unspecified dementia without behavioral disturbance: Secondary | ICD-10-CM | POA: Diagnosis not present

## 2013-10-10 DIAGNOSIS — K219 Gastro-esophageal reflux disease without esophagitis: Secondary | ICD-10-CM | POA: Diagnosis not present

## 2013-10-10 DIAGNOSIS — R609 Edema, unspecified: Secondary | ICD-10-CM | POA: Diagnosis not present

## 2013-10-10 DIAGNOSIS — I129 Hypertensive chronic kidney disease with stage 1 through stage 4 chronic kidney disease, or unspecified chronic kidney disease: Secondary | ICD-10-CM | POA: Diagnosis not present

## 2013-10-10 DIAGNOSIS — L84 Corns and callosities: Secondary | ICD-10-CM | POA: Diagnosis not present

## 2013-10-11 ENCOUNTER — Ambulatory Visit: Payer: Medicare Other | Admitting: Family Medicine

## 2013-10-14 DIAGNOSIS — N189 Chronic kidney disease, unspecified: Secondary | ICD-10-CM | POA: Diagnosis not present

## 2013-10-14 DIAGNOSIS — F0390 Unspecified dementia without behavioral disturbance: Secondary | ICD-10-CM | POA: Diagnosis not present

## 2013-10-14 DIAGNOSIS — I129 Hypertensive chronic kidney disease with stage 1 through stage 4 chronic kidney disease, or unspecified chronic kidney disease: Secondary | ICD-10-CM | POA: Diagnosis not present

## 2013-10-14 DIAGNOSIS — R609 Edema, unspecified: Secondary | ICD-10-CM | POA: Diagnosis not present

## 2013-10-14 DIAGNOSIS — K219 Gastro-esophageal reflux disease without esophagitis: Secondary | ICD-10-CM | POA: Diagnosis not present

## 2013-10-14 DIAGNOSIS — L84 Corns and callosities: Secondary | ICD-10-CM | POA: Diagnosis not present

## 2013-10-17 DIAGNOSIS — K219 Gastro-esophageal reflux disease without esophagitis: Secondary | ICD-10-CM | POA: Diagnosis not present

## 2013-10-17 DIAGNOSIS — Z9181 History of falling: Secondary | ICD-10-CM | POA: Diagnosis not present

## 2013-10-17 DIAGNOSIS — Z4800 Encounter for change or removal of nonsurgical wound dressing: Secondary | ICD-10-CM | POA: Diagnosis not present

## 2013-10-17 DIAGNOSIS — F0390 Unspecified dementia without behavioral disturbance: Secondary | ICD-10-CM | POA: Diagnosis not present

## 2013-10-17 DIAGNOSIS — N189 Chronic kidney disease, unspecified: Secondary | ICD-10-CM | POA: Diagnosis not present

## 2013-10-17 DIAGNOSIS — I129 Hypertensive chronic kidney disease with stage 1 through stage 4 chronic kidney disease, or unspecified chronic kidney disease: Secondary | ICD-10-CM | POA: Diagnosis not present

## 2013-10-17 DIAGNOSIS — L84 Corns and callosities: Secondary | ICD-10-CM | POA: Diagnosis not present

## 2013-10-17 DIAGNOSIS — G8929 Other chronic pain: Secondary | ICD-10-CM | POA: Diagnosis not present

## 2013-10-17 DIAGNOSIS — R609 Edema, unspecified: Secondary | ICD-10-CM | POA: Diagnosis not present

## 2013-10-17 DIAGNOSIS — M545 Low back pain: Secondary | ICD-10-CM | POA: Diagnosis not present

## 2013-10-21 ENCOUNTER — Ambulatory Visit (INDEPENDENT_AMBULATORY_CARE_PROVIDER_SITE_OTHER): Payer: Medicare Other | Admitting: Family Medicine

## 2013-10-21 ENCOUNTER — Encounter: Payer: Self-pay | Admitting: Family Medicine

## 2013-10-21 VITALS — BP 126/76 | HR 67 | Temp 98.4°F | Ht 61.0 in | Wt 193.2 lb

## 2013-10-21 DIAGNOSIS — K219 Gastro-esophageal reflux disease without esophagitis: Secondary | ICD-10-CM | POA: Diagnosis not present

## 2013-10-21 DIAGNOSIS — I129 Hypertensive chronic kidney disease with stage 1 through stage 4 chronic kidney disease, or unspecified chronic kidney disease: Secondary | ICD-10-CM | POA: Diagnosis not present

## 2013-10-21 DIAGNOSIS — F329 Major depressive disorder, single episode, unspecified: Secondary | ICD-10-CM

## 2013-10-21 DIAGNOSIS — I10 Essential (primary) hypertension: Secondary | ICD-10-CM

## 2013-10-21 DIAGNOSIS — F0390 Unspecified dementia without behavioral disturbance: Secondary | ICD-10-CM | POA: Diagnosis not present

## 2013-10-21 DIAGNOSIS — R609 Edema, unspecified: Secondary | ICD-10-CM | POA: Diagnosis not present

## 2013-10-21 DIAGNOSIS — E039 Hypothyroidism, unspecified: Secondary | ICD-10-CM | POA: Diagnosis not present

## 2013-10-21 DIAGNOSIS — N259 Disorder resulting from impaired renal tubular function, unspecified: Secondary | ICD-10-CM | POA: Diagnosis not present

## 2013-10-21 DIAGNOSIS — L84 Corns and callosities: Secondary | ICD-10-CM | POA: Diagnosis not present

## 2013-10-21 DIAGNOSIS — Z23 Encounter for immunization: Secondary | ICD-10-CM | POA: Diagnosis not present

## 2013-10-21 DIAGNOSIS — F3289 Other specified depressive episodes: Secondary | ICD-10-CM

## 2013-10-21 DIAGNOSIS — N189 Chronic kidney disease, unspecified: Secondary | ICD-10-CM | POA: Diagnosis not present

## 2013-10-21 NOTE — Progress Notes (Signed)
Pre-visit discussion using our clinic review tool. No additional management support is needed unless otherwise documented below in the visit note.  

## 2013-10-21 NOTE — Assessment & Plan Note (Signed)
Hypothyroidism  Pt has no clinical changes No change in energy level/ hair or skin/ edema and no tremor Lab Results  Component Value Date   TSH 0.84 04/10/2013     Did not check tsh today due to clinical stability F/u in 6 mo

## 2013-10-21 NOTE — Assessment & Plan Note (Signed)
Rev last note and f/u planned this mo with renal  bp is well controlled  Edema is baseline

## 2013-10-21 NOTE — Patient Instructions (Signed)
Pneumovax today (you only need one of these after 65) Blood pressure looks good I'm glad feet are doing better  Follow up with kidney doctor as planned  Follow up in 6 months for annual exam with labs prior

## 2013-10-21 NOTE — Assessment & Plan Note (Signed)
Per family is fairly stable/ slowly declining  Mood is good however- and pt feels safe in her current residence  Staying active and social  Not medicated due to renal status currently

## 2013-10-21 NOTE — Assessment & Plan Note (Signed)
Much improved on zoloft and also in social env of assisted living Excellent mood today

## 2013-10-21 NOTE — Assessment & Plan Note (Signed)
BP: 126/76 mmHg   No further orthostatic hypertension on current regimen Will see renal doctor this month  Rev last labs

## 2013-10-21 NOTE — Progress Notes (Signed)
Subjective:    Patient ID: Margaret Hicks, female    DOB: 04/28/1932, 79 y.o.   MRN: [id number]  HPI Here for f/u of chronic health problems   Enjoys her current residence - very social and very active Walks with walker all the time    She still complains of leg and foot problems  Seeing Dr Ether Griffins now for feet- is in soft vs hard AFO braces now and keeping callous healed   Wt is up 6 lb with bmi of 36 Her appetitie is good - gets meals and snacks three times per day  She is happier   bp is stable today  No cp or palpitations or headaches or edema  No side effects to medicines  BP Readings from Last 3 Encounters:  10/21/13 126/76  08/12/13 126/70  06/05/13 120/80    No low readings and no spells of dizziness   Renal insuff Rev note from renal from Aug - CKD stage 3 was stable  Avoiding renotoxic meds  Has f/u on dec 17th  Has quit sodas for the most part - and has water avail at all times   Hyperlipidemia  Diet controlled Lab Results  Component Value Date   CHOL 197 04/10/2013   HDL 40.70 04/10/2013   LDLCALC 129* 04/10/2013   LDLDIRECT 161.0 11/23/2010   TRIG 139.0 04/10/2013   CHOLHDL 5 04/10/2013    Hypothyroidism  Pt has no clinical changes No change in energy level/ hair or skin/ edema and no tremor Lab Results  Component Value Date   TSH 0.84 04/10/2013     Depression with dementia - mood is good overall   On zoloft  No dementia meds in light of renal status Memory slowly declines   Had her flu vaccine   Td - ? When last one was  Zoster status- thinks she had a shingles vaccine in [place] -- about 5 years ago  Pneumovax --wants to get today   Patient Active Problem List   Diagnosis Date Noted  . Callus of foot 08/12/2013  . Pedal edema 08/12/2013  . Orthostatic hypotension 05/29/2013  . Dysphagia, unspecified(787.20) 05/29/2013  . Aneurysm, splenic artery 05/29/2013  . Dementia 01/09/2013  . Back pain, thoracic 11/28/2012  . Depression 11/28/2012    . Other screening mammogram 11/28/2012  . Vertigo 11/18/2011  . EXTERNAL HEMORRHOIDS 01/26/2011  . GERD 09/01/2010  . HYPOTHYROIDISM 05/27/2010  . HYPERLIPIDEMIA 05/27/2010  . HYPERTENSION 05/27/2010  . RENAL INSUFFICIENCY 05/27/2010  . Unspecified urinary incontinence 05/27/2010  . UTI'S, HX OF 05/27/2010   Past Medical History  Diagnosis Date  . Hypothyroidism   . Urinary incontinence   . Hypertension   . Arthritis   . Esophageal reflux   . Obesity   . Renal insufficiency   . OP (osteoporosis)   . TIA (transient ischemic attack)   . Upper GI bleed   . Hyperlipemia   . Hemorrhoids   . Acute gastritis   . Schatzki's ring    Past Surgical History  Procedure Laterality Date  . Cardiac catheterization    . Cholecystectomy    . Tonsillectomy    . Abdominal hysterectomy     History  Substance Use Topics  . Smoking status: Never Smoker   . Smokeless tobacco: Never Used  . Alcohol Use: No   Family History  Problem Relation Age of Onset  . Dementia Mother   . Stroke Mother   . Arthritis Sister   . Dementia Brother   .  Cancer      nephew  . Colon cancer Neg Hx   . Heart disease Father   . Dementia Brother    Allergies  Allergen Reactions  . Simvastatin     REACTION: muscle pain/ memory issues   Current Outpatient Prescriptions on File Prior to Visit  Medication Sig Dispense Refill  . amLODipine-valsartan (EXFORGE) 5-320 MG per tablet Take 1 tablet by mouth daily.  90 tablet  3  . cholecalciferol (VITAMIN D) 1000 UNITS tablet Take 1,000 Units by mouth every evening.       Marland Kitchen dexlansoprazole (DEXILANT) 60 MG capsule Take 1 capsule (60 mg total) by mouth daily.  90 capsule  3  . docusate sodium (COLACE) 50 MG capsule Take by mouth daily.      . Flaxseed, Linseed, (FLAX SEED OIL) 1000 MG CAPS Take 1 capsule by mouth 2 (two) times daily.        . hydrocortisone (ANUSOL-HC) 2.5 % rectal cream Place 1 application rectally 2 (two) times daily as needed for  hemorrhoids.      Marland Kitchen levothyroxine (SYNTHROID, LEVOTHROID) 100 MCG tablet TAKE 1 TABLET BY MOUTH EVERY DAY  90 tablet  1  . lidocaine (XYLOCAINE) 5 % ointment Apply 1 application topically as needed (for rectal pain).      . magnesium hydroxide (MILK OF MAGNESIA) 400 MG/5ML suspension Take by mouth as needed for constipation.      . metoprolol (LOPRESSOR) 100 MG tablet Take 50 mg by mouth 2 (two) times daily.      . Multiple Vitamin (MULTIVITAMIN) capsule Take 1 capsule by mouth daily.        . Probiotic Product (PROBIOTIC FORMULA PO) Take by mouth daily.       . sertraline (ZOLOFT) 50 MG tablet Take 1 tablet (50 mg total) by mouth daily.  90 tablet  3  . spironolactone (ALDACTONE) 25 MG tablet TAKE 1/2 TABLET BY MOUTH EVERY DAY  45 tablet  1   No current facility-administered medications on file prior to visit.      Review of Systems Review of Systems  Constitutional: Negative for fever, appetite change, fatigue and unexpected weight change.  Eyes: Negative for pain and visual disturbance.  Respiratory: Negative for cough and shortness of breath.   Cardiovascular: Negative for cp or palpitations   pos for baseline pedal edema -no change Gastrointestinal: Negative for nausea, diarrhea and constipation.  Genitourinary: Negative for urgency and frequency.  Skin: Negative for pallor or rash  pos for callouses on feet cared for by podiatry that are improved  Neurological: Negative for weakness, light-headedness, numbness and headaches.  Hematological: Negative for adenopathy. Does not bruise/bleed easily.  Psychiatric/Behavioral: Negative for dysphoric mood. The patient is occ anxious, pos for poor short term memory/ dementia       Objective:   Physical Exam  Constitutional: She appears well-developed and well-nourished. No distress.  obese and well appearing   HENT:  Head: Normocephalic and atraumatic.  Mouth/Throat: Oropharynx is clear and moist.  Eyes: Conjunctivae and EOM are  normal. Pupils are equal, round, and reactive to light. No scleral icterus.  Neck: Normal range of motion. Neck supple. No JVD present. Carotid bruit is not present. No thyromegaly present.  Cardiovascular: Normal rate, regular rhythm, normal heart sounds and intact distal pulses.  Exam reveals no gallop.   Pulmonary/Chest: Effort normal and breath sounds normal. No respiratory distress. She has no wheezes. She exhibits no tenderness.  Genitourinary: No breast swelling, tenderness, discharge  or bleeding.  Musculoskeletal: Normal range of motion. She exhibits edema. She exhibits no tenderness.  Trace to 1 plus edema below the knee Soft AFO braces on ankles  Lymphadenopathy:    She has no cervical adenopathy.  Neurological: She is alert. She has normal reflexes. No cranial nerve deficit. She exhibits normal muscle tone. Coordination normal.  Skin: Skin is warm and dry. No rash noted. No erythema. No pallor.  SKs diffusely  Psychiatric: She has a normal mood and affect.  Pt was quite cheerful today, baseline dementia and often rambling speech          Assessment & Plan:

## 2013-10-22 DIAGNOSIS — R609 Edema, unspecified: Secondary | ICD-10-CM

## 2013-10-22 DIAGNOSIS — I129 Hypertensive chronic kidney disease with stage 1 through stage 4 chronic kidney disease, or unspecified chronic kidney disease: Secondary | ICD-10-CM | POA: Diagnosis not present

## 2013-10-22 DIAGNOSIS — N189 Chronic kidney disease, unspecified: Secondary | ICD-10-CM

## 2013-10-22 DIAGNOSIS — L84 Corns and callosities: Secondary | ICD-10-CM | POA: Diagnosis not present

## 2013-10-28 DIAGNOSIS — K219 Gastro-esophageal reflux disease without esophagitis: Secondary | ICD-10-CM | POA: Diagnosis not present

## 2013-10-28 DIAGNOSIS — I129 Hypertensive chronic kidney disease with stage 1 through stage 4 chronic kidney disease, or unspecified chronic kidney disease: Secondary | ICD-10-CM | POA: Diagnosis not present

## 2013-10-28 DIAGNOSIS — N189 Chronic kidney disease, unspecified: Secondary | ICD-10-CM | POA: Diagnosis not present

## 2013-10-28 DIAGNOSIS — R609 Edema, unspecified: Secondary | ICD-10-CM | POA: Diagnosis not present

## 2013-10-28 DIAGNOSIS — L84 Corns and callosities: Secondary | ICD-10-CM | POA: Diagnosis not present

## 2013-10-28 DIAGNOSIS — F0390 Unspecified dementia without behavioral disturbance: Secondary | ICD-10-CM | POA: Diagnosis not present

## 2013-11-04 DIAGNOSIS — N189 Chronic kidney disease, unspecified: Secondary | ICD-10-CM | POA: Diagnosis not present

## 2013-11-04 DIAGNOSIS — R609 Edema, unspecified: Secondary | ICD-10-CM | POA: Diagnosis not present

## 2013-11-04 DIAGNOSIS — I129 Hypertensive chronic kidney disease with stage 1 through stage 4 chronic kidney disease, or unspecified chronic kidney disease: Secondary | ICD-10-CM | POA: Diagnosis not present

## 2013-11-04 DIAGNOSIS — F0390 Unspecified dementia without behavioral disturbance: Secondary | ICD-10-CM | POA: Diagnosis not present

## 2013-11-04 DIAGNOSIS — L84 Corns and callosities: Secondary | ICD-10-CM | POA: Diagnosis not present

## 2013-11-04 DIAGNOSIS — K219 Gastro-esophageal reflux disease without esophagitis: Secondary | ICD-10-CM | POA: Diagnosis not present

## 2013-11-06 ENCOUNTER — Telehealth: Payer: Self-pay | Admitting: Family Medicine

## 2013-11-06 DIAGNOSIS — N2581 Secondary hyperparathyroidism of renal origin: Secondary | ICD-10-CM | POA: Diagnosis not present

## 2013-11-06 DIAGNOSIS — L84 Corns and callosities: Secondary | ICD-10-CM

## 2013-11-06 DIAGNOSIS — I10 Essential (primary) hypertension: Secondary | ICD-10-CM | POA: Diagnosis not present

## 2013-11-06 NOTE — Telephone Encounter (Signed)
Call from Dr Cherylann Ratel (renal) - wanted to let me know that pt's foot ulcer is just not improving much - she has seen Dr Ether Griffins for this They may be interested in a wound care center referral  Would you please call her family and ask if they would like me to refer? thanks

## 2013-11-06 NOTE — Telephone Encounter (Signed)
Pt's family agrees with wound care referral, I advise daughter-n-law Shirlee Limerick will call to schedule appt

## 2013-11-06 NOTE — Addendum Note (Signed)
Addended by: Roxy Manns A on: 11/06/2013 10:28 PM   Modules accepted: Orders

## 2013-11-11 DIAGNOSIS — L84 Corns and callosities: Secondary | ICD-10-CM | POA: Diagnosis not present

## 2013-11-11 DIAGNOSIS — K219 Gastro-esophageal reflux disease without esophagitis: Secondary | ICD-10-CM | POA: Diagnosis not present

## 2013-11-11 DIAGNOSIS — N189 Chronic kidney disease, unspecified: Secondary | ICD-10-CM | POA: Diagnosis not present

## 2013-11-11 DIAGNOSIS — I129 Hypertensive chronic kidney disease with stage 1 through stage 4 chronic kidney disease, or unspecified chronic kidney disease: Secondary | ICD-10-CM | POA: Diagnosis not present

## 2013-11-11 DIAGNOSIS — R609 Edema, unspecified: Secondary | ICD-10-CM | POA: Diagnosis not present

## 2013-11-11 DIAGNOSIS — F0390 Unspecified dementia without behavioral disturbance: Secondary | ICD-10-CM | POA: Diagnosis not present

## 2013-11-19 ENCOUNTER — Encounter: Payer: Self-pay | Admitting: Cardiothoracic Surgery

## 2013-11-19 DIAGNOSIS — L84 Corns and callosities: Secondary | ICD-10-CM | POA: Diagnosis not present

## 2013-11-19 DIAGNOSIS — F0390 Unspecified dementia without behavioral disturbance: Secondary | ICD-10-CM | POA: Diagnosis not present

## 2013-11-20 DIAGNOSIS — R609 Edema, unspecified: Secondary | ICD-10-CM | POA: Diagnosis not present

## 2013-11-20 DIAGNOSIS — F0390 Unspecified dementia without behavioral disturbance: Secondary | ICD-10-CM | POA: Diagnosis not present

## 2013-11-20 DIAGNOSIS — N189 Chronic kidney disease, unspecified: Secondary | ICD-10-CM | POA: Diagnosis not present

## 2013-11-20 DIAGNOSIS — L84 Corns and callosities: Secondary | ICD-10-CM | POA: Diagnosis not present

## 2013-11-20 DIAGNOSIS — I129 Hypertensive chronic kidney disease with stage 1 through stage 4 chronic kidney disease, or unspecified chronic kidney disease: Secondary | ICD-10-CM | POA: Diagnosis not present

## 2013-11-20 DIAGNOSIS — K219 Gastro-esophageal reflux disease without esophagitis: Secondary | ICD-10-CM | POA: Diagnosis not present

## 2013-11-21 ENCOUNTER — Encounter: Payer: Self-pay | Admitting: Cardiothoracic Surgery

## 2013-11-21 DIAGNOSIS — M2010 Hallux valgus (acquired), unspecified foot: Secondary | ICD-10-CM | POA: Diagnosis not present

## 2013-11-21 DIAGNOSIS — L84 Corns and callosities: Secondary | ICD-10-CM | POA: Diagnosis not present

## 2013-11-21 DIAGNOSIS — M899 Disorder of bone, unspecified: Secondary | ICD-10-CM | POA: Diagnosis not present

## 2013-11-25 DIAGNOSIS — L84 Corns and callosities: Secondary | ICD-10-CM | POA: Diagnosis not present

## 2013-11-25 DIAGNOSIS — N189 Chronic kidney disease, unspecified: Secondary | ICD-10-CM | POA: Diagnosis not present

## 2013-11-25 DIAGNOSIS — K219 Gastro-esophageal reflux disease without esophagitis: Secondary | ICD-10-CM | POA: Diagnosis not present

## 2013-11-25 DIAGNOSIS — I129 Hypertensive chronic kidney disease with stage 1 through stage 4 chronic kidney disease, or unspecified chronic kidney disease: Secondary | ICD-10-CM | POA: Diagnosis not present

## 2013-11-25 DIAGNOSIS — R609 Edema, unspecified: Secondary | ICD-10-CM | POA: Diagnosis not present

## 2013-11-25 DIAGNOSIS — F0390 Unspecified dementia without behavioral disturbance: Secondary | ICD-10-CM | POA: Diagnosis not present

## 2013-11-26 ENCOUNTER — Ambulatory Visit: Payer: Self-pay

## 2013-11-26 DIAGNOSIS — M899 Disorder of bone, unspecified: Secondary | ICD-10-CM | POA: Diagnosis not present

## 2013-11-26 DIAGNOSIS — M2010 Hallux valgus (acquired), unspecified foot: Secondary | ICD-10-CM | POA: Diagnosis not present

## 2013-11-26 DIAGNOSIS — L84 Corns and callosities: Secondary | ICD-10-CM | POA: Diagnosis not present

## 2013-11-26 DIAGNOSIS — M949 Disorder of cartilage, unspecified: Secondary | ICD-10-CM | POA: Diagnosis not present

## 2013-12-02 ENCOUNTER — Telehealth: Payer: Self-pay

## 2013-12-02 NOTE — Telephone Encounter (Signed)
Left voicemail giving Margaret Hicks the verbal order for nursing home health to be restarted to eval pt's foot

## 2013-12-02 NOTE — Telephone Encounter (Signed)
Please verbally ok that, thanks  

## 2013-12-02 NOTE — Telephone Encounter (Signed)
Tanya nurse with [facility] left v/m that pt was open with home health until beginning of Jan. Tanya received call from [facility] [place] that pt has wound on foot that needs attention. Tanya not sure which foot but request verbal order for nursing home health to be restarted for pt and she will send nurse to eval pts foot. Please advise.

## 2013-12-03 DIAGNOSIS — M899 Disorder of bone, unspecified: Secondary | ICD-10-CM | POA: Diagnosis not present

## 2013-12-03 DIAGNOSIS — L84 Corns and callosities: Secondary | ICD-10-CM | POA: Diagnosis not present

## 2013-12-03 DIAGNOSIS — M949 Disorder of cartilage, unspecified: Secondary | ICD-10-CM | POA: Diagnosis not present

## 2013-12-03 DIAGNOSIS — M2010 Hallux valgus (acquired), unspecified foot: Secondary | ICD-10-CM | POA: Diagnosis not present

## 2013-12-04 DIAGNOSIS — F0280 Dementia in other diseases classified elsewhere without behavioral disturbance: Secondary | ICD-10-CM | POA: Diagnosis not present

## 2013-12-04 DIAGNOSIS — G309 Alzheimer's disease, unspecified: Secondary | ICD-10-CM | POA: Diagnosis not present

## 2013-12-04 DIAGNOSIS — M549 Dorsalgia, unspecified: Secondary | ICD-10-CM | POA: Diagnosis not present

## 2013-12-04 DIAGNOSIS — L84 Corns and callosities: Secondary | ICD-10-CM | POA: Diagnosis not present

## 2013-12-04 DIAGNOSIS — I129 Hypertensive chronic kidney disease with stage 1 through stage 4 chronic kidney disease, or unspecified chronic kidney disease: Secondary | ICD-10-CM | POA: Diagnosis not present

## 2013-12-04 DIAGNOSIS — Z4800 Encounter for change or removal of nonsurgical wound dressing: Secondary | ICD-10-CM | POA: Diagnosis not present

## 2013-12-04 DIAGNOSIS — G8929 Other chronic pain: Secondary | ICD-10-CM | POA: Diagnosis not present

## 2013-12-04 DIAGNOSIS — N189 Chronic kidney disease, unspecified: Secondary | ICD-10-CM | POA: Diagnosis not present

## 2013-12-04 DIAGNOSIS — Z9181 History of falling: Secondary | ICD-10-CM | POA: Diagnosis not present

## 2013-12-05 DIAGNOSIS — L84 Corns and callosities: Secondary | ICD-10-CM | POA: Diagnosis not present

## 2013-12-05 DIAGNOSIS — I129 Hypertensive chronic kidney disease with stage 1 through stage 4 chronic kidney disease, or unspecified chronic kidney disease: Secondary | ICD-10-CM | POA: Diagnosis not present

## 2013-12-05 DIAGNOSIS — G8929 Other chronic pain: Secondary | ICD-10-CM | POA: Diagnosis not present

## 2013-12-05 DIAGNOSIS — F0280 Dementia in other diseases classified elsewhere without behavioral disturbance: Secondary | ICD-10-CM | POA: Diagnosis not present

## 2013-12-05 DIAGNOSIS — N189 Chronic kidney disease, unspecified: Secondary | ICD-10-CM | POA: Diagnosis not present

## 2013-12-05 DIAGNOSIS — G309 Alzheimer's disease, unspecified: Secondary | ICD-10-CM | POA: Diagnosis not present

## 2013-12-09 DIAGNOSIS — L84 Corns and callosities: Secondary | ICD-10-CM | POA: Diagnosis not present

## 2013-12-09 DIAGNOSIS — G8929 Other chronic pain: Secondary | ICD-10-CM | POA: Diagnosis not present

## 2013-12-09 DIAGNOSIS — I129 Hypertensive chronic kidney disease with stage 1 through stage 4 chronic kidney disease, or unspecified chronic kidney disease: Secondary | ICD-10-CM | POA: Diagnosis not present

## 2013-12-09 DIAGNOSIS — G309 Alzheimer's disease, unspecified: Secondary | ICD-10-CM | POA: Diagnosis not present

## 2013-12-09 DIAGNOSIS — N189 Chronic kidney disease, unspecified: Secondary | ICD-10-CM | POA: Diagnosis not present

## 2013-12-09 DIAGNOSIS — F0280 Dementia in other diseases classified elsewhere without behavioral disturbance: Secondary | ICD-10-CM | POA: Diagnosis not present

## 2013-12-11 DIAGNOSIS — N189 Chronic kidney disease, unspecified: Secondary | ICD-10-CM | POA: Diagnosis not present

## 2013-12-11 DIAGNOSIS — G309 Alzheimer's disease, unspecified: Secondary | ICD-10-CM | POA: Diagnosis not present

## 2013-12-11 DIAGNOSIS — L84 Corns and callosities: Secondary | ICD-10-CM | POA: Diagnosis not present

## 2013-12-11 DIAGNOSIS — G8929 Other chronic pain: Secondary | ICD-10-CM | POA: Diagnosis not present

## 2013-12-11 DIAGNOSIS — F0280 Dementia in other diseases classified elsewhere without behavioral disturbance: Secondary | ICD-10-CM | POA: Diagnosis not present

## 2013-12-11 DIAGNOSIS — I129 Hypertensive chronic kidney disease with stage 1 through stage 4 chronic kidney disease, or unspecified chronic kidney disease: Secondary | ICD-10-CM | POA: Diagnosis not present

## 2013-12-12 ENCOUNTER — Encounter: Payer: Self-pay | Admitting: Surgery

## 2013-12-12 DIAGNOSIS — L84 Corns and callosities: Secondary | ICD-10-CM | POA: Diagnosis not present

## 2013-12-12 DIAGNOSIS — M899 Disorder of bone, unspecified: Secondary | ICD-10-CM | POA: Diagnosis not present

## 2013-12-12 DIAGNOSIS — M2010 Hallux valgus (acquired), unspecified foot: Secondary | ICD-10-CM | POA: Diagnosis not present

## 2013-12-12 DIAGNOSIS — M949 Disorder of cartilage, unspecified: Secondary | ICD-10-CM | POA: Diagnosis not present

## 2013-12-14 DIAGNOSIS — G309 Alzheimer's disease, unspecified: Secondary | ICD-10-CM | POA: Diagnosis not present

## 2013-12-14 DIAGNOSIS — F0280 Dementia in other diseases classified elsewhere without behavioral disturbance: Secondary | ICD-10-CM | POA: Diagnosis not present

## 2013-12-14 DIAGNOSIS — N189 Chronic kidney disease, unspecified: Secondary | ICD-10-CM | POA: Diagnosis not present

## 2013-12-14 DIAGNOSIS — I129 Hypertensive chronic kidney disease with stage 1 through stage 4 chronic kidney disease, or unspecified chronic kidney disease: Secondary | ICD-10-CM | POA: Diagnosis not present

## 2013-12-14 DIAGNOSIS — L84 Corns and callosities: Secondary | ICD-10-CM | POA: Diagnosis not present

## 2013-12-14 DIAGNOSIS — G8929 Other chronic pain: Secondary | ICD-10-CM | POA: Diagnosis not present

## 2013-12-17 DIAGNOSIS — I129 Hypertensive chronic kidney disease with stage 1 through stage 4 chronic kidney disease, or unspecified chronic kidney disease: Secondary | ICD-10-CM | POA: Diagnosis not present

## 2013-12-17 DIAGNOSIS — N189 Chronic kidney disease, unspecified: Secondary | ICD-10-CM | POA: Diagnosis not present

## 2013-12-17 DIAGNOSIS — F0280 Dementia in other diseases classified elsewhere without behavioral disturbance: Secondary | ICD-10-CM | POA: Diagnosis not present

## 2013-12-17 DIAGNOSIS — L84 Corns and callosities: Secondary | ICD-10-CM | POA: Diagnosis not present

## 2013-12-17 DIAGNOSIS — G309 Alzheimer's disease, unspecified: Secondary | ICD-10-CM | POA: Diagnosis not present

## 2013-12-17 DIAGNOSIS — G8929 Other chronic pain: Secondary | ICD-10-CM | POA: Diagnosis not present

## 2013-12-17 LAB — PATHOLOGY REPORT

## 2013-12-18 ENCOUNTER — Other Ambulatory Visit: Payer: Self-pay | Admitting: Family Medicine

## 2013-12-19 DIAGNOSIS — L84 Corns and callosities: Secondary | ICD-10-CM | POA: Diagnosis not present

## 2013-12-19 DIAGNOSIS — M899 Disorder of bone, unspecified: Secondary | ICD-10-CM | POA: Diagnosis not present

## 2013-12-19 DIAGNOSIS — M949 Disorder of cartilage, unspecified: Secondary | ICD-10-CM | POA: Diagnosis not present

## 2013-12-19 DIAGNOSIS — M2010 Hallux valgus (acquired), unspecified foot: Secondary | ICD-10-CM | POA: Diagnosis not present

## 2013-12-20 DIAGNOSIS — G309 Alzheimer's disease, unspecified: Secondary | ICD-10-CM

## 2013-12-20 DIAGNOSIS — G8929 Other chronic pain: Secondary | ICD-10-CM | POA: Diagnosis not present

## 2013-12-20 DIAGNOSIS — L84 Corns and callosities: Secondary | ICD-10-CM

## 2013-12-20 DIAGNOSIS — I129 Hypertensive chronic kidney disease with stage 1 through stage 4 chronic kidney disease, or unspecified chronic kidney disease: Secondary | ICD-10-CM | POA: Diagnosis not present

## 2013-12-20 DIAGNOSIS — N189 Chronic kidney disease, unspecified: Secondary | ICD-10-CM

## 2013-12-20 DIAGNOSIS — F0280 Dementia in other diseases classified elsewhere without behavioral disturbance: Secondary | ICD-10-CM | POA: Diagnosis not present

## 2013-12-23 DIAGNOSIS — G8929 Other chronic pain: Secondary | ICD-10-CM | POA: Diagnosis not present

## 2013-12-23 DIAGNOSIS — G309 Alzheimer's disease, unspecified: Secondary | ICD-10-CM | POA: Diagnosis not present

## 2013-12-23 DIAGNOSIS — N189 Chronic kidney disease, unspecified: Secondary | ICD-10-CM | POA: Diagnosis not present

## 2013-12-23 DIAGNOSIS — F0280 Dementia in other diseases classified elsewhere without behavioral disturbance: Secondary | ICD-10-CM | POA: Diagnosis not present

## 2013-12-23 DIAGNOSIS — I129 Hypertensive chronic kidney disease with stage 1 through stage 4 chronic kidney disease, or unspecified chronic kidney disease: Secondary | ICD-10-CM | POA: Diagnosis not present

## 2013-12-23 DIAGNOSIS — L84 Corns and callosities: Secondary | ICD-10-CM | POA: Diagnosis not present

## 2014-01-02 DIAGNOSIS — L84 Corns and callosities: Secondary | ICD-10-CM | POA: Diagnosis not present

## 2014-01-02 DIAGNOSIS — N189 Chronic kidney disease, unspecified: Secondary | ICD-10-CM | POA: Diagnosis not present

## 2014-01-02 DIAGNOSIS — G8929 Other chronic pain: Secondary | ICD-10-CM | POA: Diagnosis not present

## 2014-01-02 DIAGNOSIS — F0280 Dementia in other diseases classified elsewhere without behavioral disturbance: Secondary | ICD-10-CM | POA: Diagnosis not present

## 2014-01-02 DIAGNOSIS — I129 Hypertensive chronic kidney disease with stage 1 through stage 4 chronic kidney disease, or unspecified chronic kidney disease: Secondary | ICD-10-CM | POA: Diagnosis not present

## 2014-01-08 DIAGNOSIS — I129 Hypertensive chronic kidney disease with stage 1 through stage 4 chronic kidney disease, or unspecified chronic kidney disease: Secondary | ICD-10-CM | POA: Diagnosis not present

## 2014-01-08 DIAGNOSIS — N189 Chronic kidney disease, unspecified: Secondary | ICD-10-CM | POA: Diagnosis not present

## 2014-01-08 DIAGNOSIS — F0280 Dementia in other diseases classified elsewhere without behavioral disturbance: Secondary | ICD-10-CM | POA: Diagnosis not present

## 2014-01-08 DIAGNOSIS — L84 Corns and callosities: Secondary | ICD-10-CM | POA: Diagnosis not present

## 2014-01-08 DIAGNOSIS — G8929 Other chronic pain: Secondary | ICD-10-CM | POA: Diagnosis not present

## 2014-01-20 ENCOUNTER — Telehealth: Payer: Self-pay

## 2014-01-20 NOTE — Telephone Encounter (Signed)
Tanya with [facility] left v/m requesting verbal order to eval and treat for padding needed for arch of foot. Tanya request cb.

## 2014-01-20 NOTE — Telephone Encounter (Signed)
Left detailed message on [facility]ic[facility] at [facility].

## 2014-01-20 NOTE — Telephone Encounter (Signed)
Please ok that verbal order  

## 2014-01-23 DIAGNOSIS — F0280 Dementia in other diseases classified elsewhere without behavioral disturbance: Secondary | ICD-10-CM | POA: Diagnosis not present

## 2014-01-23 DIAGNOSIS — N189 Chronic kidney disease, unspecified: Secondary | ICD-10-CM | POA: Diagnosis not present

## 2014-01-23 DIAGNOSIS — L84 Corns and callosities: Secondary | ICD-10-CM | POA: Diagnosis not present

## 2014-01-23 DIAGNOSIS — Z4800 Encounter for change or removal of nonsurgical wound dressing: Secondary | ICD-10-CM | POA: Diagnosis not present

## 2014-01-23 DIAGNOSIS — Z9181 History of falling: Secondary | ICD-10-CM | POA: Diagnosis not present

## 2014-01-23 DIAGNOSIS — G8929 Other chronic pain: Secondary | ICD-10-CM | POA: Diagnosis not present

## 2014-01-23 DIAGNOSIS — M549 Dorsalgia, unspecified: Secondary | ICD-10-CM | POA: Diagnosis not present

## 2014-01-23 DIAGNOSIS — I129 Hypertensive chronic kidney disease with stage 1 through stage 4 chronic kidney disease, or unspecified chronic kidney disease: Secondary | ICD-10-CM | POA: Diagnosis not present

## 2014-01-27 DIAGNOSIS — I129 Hypertensive chronic kidney disease with stage 1 through stage 4 chronic kidney disease, or unspecified chronic kidney disease: Secondary | ICD-10-CM | POA: Diagnosis not present

## 2014-01-27 DIAGNOSIS — F0280 Dementia in other diseases classified elsewhere without behavioral disturbance: Secondary | ICD-10-CM | POA: Diagnosis not present

## 2014-01-27 DIAGNOSIS — L84 Corns and callosities: Secondary | ICD-10-CM | POA: Diagnosis not present

## 2014-01-27 DIAGNOSIS — M549 Dorsalgia, unspecified: Secondary | ICD-10-CM | POA: Diagnosis not present

## 2014-01-27 DIAGNOSIS — N189 Chronic kidney disease, unspecified: Secondary | ICD-10-CM | POA: Diagnosis not present

## 2014-01-30 DIAGNOSIS — F0280 Dementia in other diseases classified elsewhere without behavioral disturbance: Secondary | ICD-10-CM | POA: Diagnosis not present

## 2014-01-30 DIAGNOSIS — G309 Alzheimer's disease, unspecified: Secondary | ICD-10-CM | POA: Diagnosis not present

## 2014-01-30 DIAGNOSIS — N189 Chronic kidney disease, unspecified: Secondary | ICD-10-CM | POA: Diagnosis not present

## 2014-01-30 DIAGNOSIS — I129 Hypertensive chronic kidney disease with stage 1 through stage 4 chronic kidney disease, or unspecified chronic kidney disease: Secondary | ICD-10-CM | POA: Diagnosis not present

## 2014-01-30 DIAGNOSIS — L84 Corns and callosities: Secondary | ICD-10-CM | POA: Diagnosis not present

## 2014-01-30 DIAGNOSIS — M549 Dorsalgia, unspecified: Secondary | ICD-10-CM | POA: Diagnosis not present

## 2014-01-31 DIAGNOSIS — G309 Alzheimer's disease, unspecified: Secondary | ICD-10-CM

## 2014-01-31 DIAGNOSIS — N189 Chronic kidney disease, unspecified: Secondary | ICD-10-CM | POA: Diagnosis not present

## 2014-01-31 DIAGNOSIS — M549 Dorsalgia, unspecified: Secondary | ICD-10-CM | POA: Diagnosis not present

## 2014-01-31 DIAGNOSIS — L84 Corns and callosities: Secondary | ICD-10-CM

## 2014-01-31 DIAGNOSIS — I129 Hypertensive chronic kidney disease with stage 1 through stage 4 chronic kidney disease, or unspecified chronic kidney disease: Secondary | ICD-10-CM | POA: Diagnosis not present

## 2014-01-31 DIAGNOSIS — F0280 Dementia in other diseases classified elsewhere without behavioral disturbance: Secondary | ICD-10-CM | POA: Diagnosis not present

## 2014-02-03 DIAGNOSIS — F0280 Dementia in other diseases classified elsewhere without behavioral disturbance: Secondary | ICD-10-CM | POA: Diagnosis not present

## 2014-02-03 DIAGNOSIS — L84 Corns and callosities: Secondary | ICD-10-CM | POA: Diagnosis not present

## 2014-02-03 DIAGNOSIS — G309 Alzheimer's disease, unspecified: Secondary | ICD-10-CM | POA: Diagnosis not present

## 2014-02-03 DIAGNOSIS — M549 Dorsalgia, unspecified: Secondary | ICD-10-CM | POA: Diagnosis not present

## 2014-02-03 DIAGNOSIS — N189 Chronic kidney disease, unspecified: Secondary | ICD-10-CM | POA: Diagnosis not present

## 2014-02-03 DIAGNOSIS — I129 Hypertensive chronic kidney disease with stage 1 through stage 4 chronic kidney disease, or unspecified chronic kidney disease: Secondary | ICD-10-CM | POA: Diagnosis not present

## 2014-02-05 DIAGNOSIS — F0280 Dementia in other diseases classified elsewhere without behavioral disturbance: Secondary | ICD-10-CM | POA: Diagnosis not present

## 2014-02-05 DIAGNOSIS — L84 Corns and callosities: Secondary | ICD-10-CM | POA: Diagnosis not present

## 2014-02-05 DIAGNOSIS — I129 Hypertensive chronic kidney disease with stage 1 through stage 4 chronic kidney disease, or unspecified chronic kidney disease: Secondary | ICD-10-CM | POA: Diagnosis not present

## 2014-02-05 DIAGNOSIS — N189 Chronic kidney disease, unspecified: Secondary | ICD-10-CM | POA: Diagnosis not present

## 2014-02-05 DIAGNOSIS — M549 Dorsalgia, unspecified: Secondary | ICD-10-CM | POA: Diagnosis not present

## 2014-02-07 DIAGNOSIS — N189 Chronic kidney disease, unspecified: Secondary | ICD-10-CM | POA: Diagnosis not present

## 2014-02-07 DIAGNOSIS — I129 Hypertensive chronic kidney disease with stage 1 through stage 4 chronic kidney disease, or unspecified chronic kidney disease: Secondary | ICD-10-CM | POA: Diagnosis not present

## 2014-02-07 DIAGNOSIS — F0280 Dementia in other diseases classified elsewhere without behavioral disturbance: Secondary | ICD-10-CM | POA: Diagnosis not present

## 2014-02-07 DIAGNOSIS — L84 Corns and callosities: Secondary | ICD-10-CM | POA: Diagnosis not present

## 2014-02-07 DIAGNOSIS — G309 Alzheimer's disease, unspecified: Secondary | ICD-10-CM | POA: Diagnosis not present

## 2014-02-07 DIAGNOSIS — M549 Dorsalgia, unspecified: Secondary | ICD-10-CM | POA: Diagnosis not present

## 2014-03-18 ENCOUNTER — Other Ambulatory Visit: Payer: Self-pay | Admitting: *Deleted

## 2014-03-18 MED ORDER — SPIRONOLACTONE 25 MG PO TABS: ORAL_TABLET | ORAL | Status: DC

## 2014-03-21 ENCOUNTER — Encounter: Payer: Medicare Other | Admitting: Family Medicine

## 2014-03-31 ENCOUNTER — Other Ambulatory Visit: Payer: Self-pay | Admitting: Family Medicine

## 2014-03-31 NOTE — Telephone Encounter (Signed)
Electronic refill request, please advise  

## 2014-03-31 NOTE — Telephone Encounter (Signed)
done

## 2014-03-31 NOTE — Telephone Encounter (Signed)
Please refill for a year  

## 2014-05-02 ENCOUNTER — Telehealth: Payer: Self-pay | Admitting: *Deleted

## 2014-05-02 NOTE — Telephone Encounter (Signed)
Kenney Housemananya, RN with [facility] was calling to get verbal orders.  She evaluated [facility] at [facility] and is recommending that we do a verbal order for a nurse evaluation do start care and then have a speech/ dysphagia eval due to changes in pt's speech and eating, they are thinking that the changes are due to her dementia worsening, but would like an order for a nurse to evaluate her and if they think it is nessary they will do the speech/dysphagia eval too.   If you are okay with the orders Kenney Housemananya will also call family and let them know they are doing the speech and dysphagia eval

## 2014-05-02 NOTE — Telephone Encounter (Signed)
Left voicemail on Margaret Hicks's phone giving her the verbal orders and advise her to make sure pt's family agrees with eval

## 2014-05-02 NOTE — Telephone Encounter (Signed)
Please verbally ok those orders - they do need to inform family and make sure they are agreeable Thanks for the update

## 2014-05-16 ENCOUNTER — Telehealth: Payer: Self-pay

## 2014-05-16 NOTE — Telephone Encounter (Signed)
Margaret RadarPaula Hicks received letter from [facility] in [place] that for pt to received medications at that facility will need copy of printed rx for all pts meds. Margaret Hicks said she will continue to get pts meds thru [facility] due to cost of meds being less expensive than pharmacy associated with nursing home; but [facility] still needs copy of printed rx. Margaret Hicks will have Margaret Hicks fax copy of this letter to [facility] at [place] [phone]. Margaret request cb when rx have been sent to [facility]; Margaret Hicks said has to be completed by 05/25/14.

## 2014-05-16 NOTE — Telephone Encounter (Signed)
Physical therapist with [facility] left v/m requesting approval of plan of care for home health PT;PT 2 x a week for 4 weeks for strengthening, balance training and education.Please advise.

## 2014-05-18 NOTE — Telephone Encounter (Signed)
Routed to PCP for input.  

## 2014-05-19 DIAGNOSIS — M6281 Muscle weakness (generalized): Secondary | ICD-10-CM | POA: Diagnosis not present

## 2014-05-19 DIAGNOSIS — I10 Essential (primary) hypertension: Secondary | ICD-10-CM | POA: Diagnosis not present

## 2014-05-19 DIAGNOSIS — R4701 Aphasia: Secondary | ICD-10-CM | POA: Diagnosis not present

## 2014-05-19 DIAGNOSIS — F0390 Unspecified dementia without behavioral disturbance: Secondary | ICD-10-CM | POA: Diagnosis not present

## 2014-05-19 NOTE — Telephone Encounter (Signed)
Please print out her px and I will sign them when I am in  thanks

## 2014-05-19 NOTE — Telephone Encounter (Signed)
Please give the verbal ok

## 2014-05-20 MED ORDER — LEVOTHYROXINE SODIUM 100 MCG PO TABS: ORAL_TABLET | ORAL | Status: DC

## 2014-05-20 MED ORDER — SERTRALINE HCL 50 MG PO TABS: ORAL_TABLET | ORAL | Status: DC

## 2014-05-20 MED ORDER — METOPROLOL TARTRATE 100 MG PO TABS: 50.0000 mg | ORAL_TABLET | Freq: Two times a day (BID) | ORAL | Status: DC

## 2014-05-20 MED ORDER — SPIRONOLACTONE 25 MG PO TABS: ORAL_TABLET | ORAL | Status: DC

## 2014-05-20 MED ORDER — AMLODIPINE BESYLATE-VALSARTAN 5-320 MG PO TABS: 1.0000 | ORAL_TABLET | Freq: Every day | ORAL | Status: DC

## 2014-05-20 NOTE — Telephone Encounter (Signed)
Verbal order given  

## 2014-05-20 NOTE — Telephone Encounter (Signed)
Rxs printed and placed in your inbox 

## 2014-05-20 NOTE — Telephone Encounter (Signed)
Done and in IN box 

## 2014-05-21 NOTE — Telephone Encounter (Signed)
Lawson RadarPaula Joyce notified Rxs are ready for pick-up

## 2014-05-22 DIAGNOSIS — N2581 Secondary hyperparathyroidism of renal origin: Secondary | ICD-10-CM | POA: Diagnosis not present

## 2014-05-22 DIAGNOSIS — N183 Chronic kidney disease, stage 3 unspecified: Secondary | ICD-10-CM | POA: Diagnosis not present

## 2014-05-22 DIAGNOSIS — I10 Essential (primary) hypertension: Secondary | ICD-10-CM | POA: Diagnosis not present

## 2014-05-29 ENCOUNTER — Telehealth: Payer: Self-pay | Admitting: *Deleted

## 2014-05-30 NOTE — Telephone Encounter (Signed)
PER PAULA JOYCE, WE NEED TO CONFIRM ANY ORDERS FOR PT WITH HER BEFORE WE GIVE A VERBAL ORDER TO ANY HOME HEALTH AGENCIES  Lawson RadarULA JOYCE PHONE # [phone]

## 2014-06-02 ENCOUNTER — Telehealth: Payer: Self-pay | Admitting: *Deleted

## 2014-06-05 MED ORDER — SERTRALINE HCL 50 MG PO TABS: ORAL_TABLET | ORAL | Status: DC

## 2014-06-05 NOTE — Telephone Encounter (Signed)
Pharmacy is requesting a 90 day supply on her zoloft, please advise

## 2014-06-05 NOTE — Telephone Encounter (Signed)
Please refill for 6 mo 

## 2014-06-05 NOTE — Telephone Encounter (Signed)
done

## 2014-06-25 ENCOUNTER — Other Ambulatory Visit: Payer: Self-pay | Admitting: Gastroenterology

## 2014-06-27 ENCOUNTER — Other Ambulatory Visit: Payer: Self-pay | Admitting: Gastroenterology

## 2014-06-27 ENCOUNTER — Telehealth: Payer: Self-pay

## 2014-06-27 ENCOUNTER — Other Ambulatory Visit: Payer: Self-pay | Admitting: Family Medicine

## 2014-06-27 NOTE — Telephone Encounter (Signed)
Paula left v/m wanting to know reason for not approving refills; spoke with Les Pouarlton and he said he would advise Paula levothyroxine and spironolactone were only meds requested and they were approved. Gunnar Fusiaula will contact Dr Maris BergerPattersons office about Dexilant.

## 2014-06-30 ENCOUNTER — Telehealth: Payer: Self-pay | Admitting: Gastroenterology

## 2014-06-30 MED ORDER — DEXLANSOPRAZOLE 60 MG PO CPDR: 60.0000 mg | DELAYED_RELEASE_CAPSULE | Freq: Every day | ORAL | Status: DC

## 2014-06-30 NOTE — Telephone Encounter (Signed)
Margaret Hicks,   Patient is scheduled with Dr. Arlyce DiceKaplan for 08-27-2014. Is it okay to refill Dexilant, 90 day supply.

## 2014-06-30 NOTE — Telephone Encounter (Signed)
Ok to refill 

## 2014-08-05 ENCOUNTER — Telehealth: Payer: Self-pay | Admitting: Family Medicine

## 2014-08-05 NOTE — Telephone Encounter (Signed)
Margaret Hicks w/[facility] called inquiring about the last time you had a face to face w/pt. She says she has June of this year but in pt's chart I see December, 2014.  She also wants documentation from the last face to face. I didn't give her any info, just told her I would pass the note on to you. She would like for someone to call her back. Thank you.

## 2014-08-05 NOTE — Telephone Encounter (Signed)
Pt had CPE scheduled in May of 2015 but cancelled due to lack of transportation, pt has CPE scheduled for Oct 2015, I advise [facility] of appt dates

## 2014-08-05 NOTE — Telephone Encounter (Signed)
I also see 12/14 as her last visit

## 2014-08-08 ENCOUNTER — Other Ambulatory Visit: Payer: Self-pay | Admitting: Family Medicine

## 2014-08-27 ENCOUNTER — Ambulatory Visit (INDEPENDENT_AMBULATORY_CARE_PROVIDER_SITE_OTHER): Payer: Medicare Other | Admitting: Gastroenterology

## 2014-08-27 ENCOUNTER — Encounter: Payer: Self-pay | Admitting: Gastroenterology

## 2014-08-27 VITALS — BP 148/74 | HR 68 | Ht 62.0 in | Wt 205.0 lb

## 2014-08-27 DIAGNOSIS — K648 Other hemorrhoids: Secondary | ICD-10-CM

## 2014-08-27 DIAGNOSIS — K219 Gastro-esophageal reflux disease without esophagitis: Secondary | ICD-10-CM

## 2014-08-27 MED ORDER — DEXLANSOPRAZOLE 60 MG PO CPDR: 60.0000 mg | DELAYED_RELEASE_CAPSULE | Freq: Every day | ORAL | Status: DC

## 2014-08-27 NOTE — Addendum Note (Signed)
Addended by: Marlowe KaysSTALLINGS, Petrice Beedy M on: 08/27/2014 02:18 PM   Modules accepted: Orders

## 2014-08-27 NOTE — Assessment & Plan Note (Signed)
Symptoms are well controlled with dexilant.  Plan to continue with the same. 

## 2014-08-27 NOTE — Patient Instructions (Signed)
Call [phone] if you decide to have the hemorrhoidal banding done We will renew your dexilant today

## 2014-08-27 NOTE — Assessment & Plan Note (Signed)
History of Schatzki's ring.  Asymptomatic since last dilation in 2011.

## 2014-08-27 NOTE — Assessment & Plan Note (Signed)
Patient has symptomatic hemorrhoids despite medical therapy.  Recommendations #1 band ligation of internal hemorrhoids.  The patient's daughter-in-law would like to discuss this with her husband before scheduling the procedure

## 2014-08-27 NOTE — Progress Notes (Signed)
_                                                                                                                History of Present Illness:  [name] is a pleasant 80 year old white female, former patient of Dr. Norval GablePatterson's, with history of esophageal stricture and GERD, here for evaluation of rectal pain and renewal of dexilant.  With dexilant reflux symptoms are fairly well-controlled.  She does complain of excess belching.  Endoscopy in 2011 demonstrated a Schatzki's ring which was dilated.  She no longer has dysphagia.  She complains of frequent rectal discomfort both during and between bowel movements.  She's had minimal bleeding on the toilet tissue.  She is taking various topicals and suppositories without much improvement.  She does move her bowels regularly.   Past Medical History  Diagnosis Date  . Hypothyroidism   . Urinary incontinence   . Hypertension   . Arthritis   . Esophageal reflux   . Obesity   . Renal insufficiency   . OP (osteoporosis)   . TIA (transient ischemic attack)   . Upper GI bleed   . Hyperlipemia   . Hemorrhoids   . Acute gastritis   . Schatzki's ring    Past Surgical History  Procedure Laterality Date  . Cardiac catheterization    . Cholecystectomy    . Tonsillectomy    . Abdominal hysterectomy     family history includes Arthritis in her sister; Cancer in an other family member; Dementia in her brother, brother, and mother; Heart disease in her father; Stroke in her mother. There is no history of Colon cancer. Current Outpatient Prescriptions  Medication Sig Dispense Refill  . amLODipine-valsartan (EXFORGE) 5-320 MG per tablet Take 1 tablet by mouth daily.  90 tablet  0  . cholecalciferol (VITAMIN D) 1000 UNITS tablet Take 1,000 Units by mouth every evening.       Marland Kitchen. dexlansoprazole (DEXILANT) 60 MG capsule Take 1 capsule (60 mg total) by mouth daily.  90 capsule  3  . docusate sodium (COLACE) 50 MG capsule Take by mouth daily.       . Flaxseed, Linseed, (FLAX SEED OIL) 1000 MG CAPS Take 1 capsule by mouth 2 (two) times daily.        Marland Kitchen. levothyroxine (SYNTHROID, LEVOTHROID) 100 MCG tablet TAKE 1 TABLET BY MOUTH EVERY DAY  90 tablet  0  . metoprolol (LOPRESSOR) 100 MG tablet TAKE 1 TABLET BY MOUTH TWICE DAILY  180 tablet  3  . Multiple Vitamin (MULTIVITAMIN) capsule Take 1 capsule by mouth daily.        . Probiotic Product (PROBIOTIC FORMULA PO) Take by mouth daily.       . sertraline (ZOLOFT) 50 MG tablet TAKE 1 TABLET (50 MG TOTAL) BY MOUTH DAILY.  90 tablet  1  . spironolactone (ALDACTONE) 25 MG tablet TAKE 1/2 TABLET BY MOUTH EVERY DAY  45 tablet  0   No current facility-administered medications for this visit.   Allergies  as of 08/27/2014 - Review Complete 08/27/2014  Allergen Reaction Noted  . Simvastatin  09/01/2010    reports that she has never smoked. She has never used smokeless tobacco. She reports that she does not drink alcohol or use illicit drugs.   Review of Systems: She suffers from confusion attributed to dementia and has lower extremity weakness Pertinent positive and negative review of systems were noted in the above HPI section. All other review of systems were otherwise negative.  Vital signs were reviewed in today's medical record Physical Exam: General: Well developed , well nourished, no acute distress Skin: anicteric Head: Normocephalic and atraumatic Eyes:  sclerae anicteric, EOMI Ears: Normal auditory acuity Mouth: No deformity or lesions Neck: Supple, no masses or thyromegaly Lungs: Clear throughout to auscultation Heart: Regular rate and rhythm; no murmurs, rubs or bruits Abdomen: Soft, non tender and non distended. No masses, hepatosplenomegaly or hernias noted. Normal Bowel sounds Rectal: Grade 3 hemorrhoids and external skin tags are present Musculoskeletal: Symmetrical with no gross deformities  Skin: No lesions on visible extremities Pulses:  Normal pulses noted Extremities:  No clubbing, cyanosis, edema or deformities noted Neurological: Alert oriented x 4, grossly nonfocal Cervical Nodes:  No significant cervical adenopathy Inguinal Nodes: No significant inguinal adenopathy Psychological:  Alert and cooperative. Normal mood and affect  See Assessment and Plan under Problem List

## 2014-09-01 ENCOUNTER — Ambulatory Visit (INDEPENDENT_AMBULATORY_CARE_PROVIDER_SITE_OTHER): Payer: Medicare Other | Admitting: Family Medicine

## 2014-09-01 ENCOUNTER — Encounter: Payer: Self-pay | Admitting: Family Medicine

## 2014-09-01 VITALS — BP 118/68 | HR 60 | Temp 98.6°F | Ht 61.0 in | Wt 203.0 lb

## 2014-09-01 DIAGNOSIS — I10 Essential (primary) hypertension: Secondary | ICD-10-CM

## 2014-09-01 DIAGNOSIS — N259 Disorder resulting from impaired renal tubular function, unspecified: Secondary | ICD-10-CM

## 2014-09-01 DIAGNOSIS — E785 Hyperlipidemia, unspecified: Secondary | ICD-10-CM | POA: Diagnosis not present

## 2014-09-01 DIAGNOSIS — Z0000 Encounter for general adult medical examination without abnormal findings: Secondary | ICD-10-CM | POA: Insufficient documentation

## 2014-09-01 DIAGNOSIS — Z23 Encounter for immunization: Secondary | ICD-10-CM | POA: Diagnosis not present

## 2014-09-01 DIAGNOSIS — E039 Hypothyroidism, unspecified: Secondary | ICD-10-CM | POA: Diagnosis not present

## 2014-09-01 DIAGNOSIS — F0390 Unspecified dementia without behavioral disturbance: Secondary | ICD-10-CM

## 2014-09-01 LAB — CBC WITH DIFFERENTIAL/PLATELET
Basophils Absolute: 0 10*3/uL (ref 0.0–0.1)
Basophils Relative: 0.3 % (ref 0.0–3.0)
EOS PCT: 1 % (ref 0.0–5.0)
Eosinophils Absolute: 0.1 10*3/uL (ref 0.0–0.7)
HEMATOCRIT: 40.5 % (ref 36.0–46.0)
HEMOGLOBIN: 13.4 g/dL (ref 12.0–15.0)
Lymphocytes Relative: 30.9 % (ref 12.0–46.0)
Lymphs Abs: 1.7 10*3/uL (ref 0.7–4.0)
MCHC: 33.1 g/dL (ref 30.0–36.0)
MCV: 83.7 fl (ref 78.0–100.0)
MONO ABS: 0.6 10*3/uL (ref 0.1–1.0)
MONOS PCT: 9.9 % (ref 3.0–12.0)
NEUTROS ABS: 3.3 10*3/uL (ref 1.4–7.7)
Neutrophils Relative %: 57.9 % (ref 43.0–77.0)
Platelets: 238 10*3/uL (ref 150.0–400.0)
RBC: 4.84 Mil/uL (ref 3.87–5.11)
RDW: 14.5 % (ref 11.5–15.5)
WBC: 5.6 10*3/uL (ref 4.0–10.5)

## 2014-09-01 LAB — LIPID PANEL
CHOLESTEROL: 220 mg/dL — AB (ref 0–200)
HDL: 46 mg/dL (ref >39.00)
LDL Cholesterol: 137 mg/dL — ABNORMAL HIGH (ref 0–99)
NonHDL: 174
Total CHOL/HDL Ratio: 5
Triglycerides: 186 mg/dL — ABNORMAL HIGH (ref 0.0–149.0)
VLDL: 37.2 mg/dL (ref 0.0–40.0)

## 2014-09-01 LAB — COMPREHENSIVE METABOLIC PANEL
ALK PHOS: 89 U/L (ref 39–117)
ALT: 14 U/L (ref 0–35)
AST: 20 U/L (ref 0–37)
Albumin: 3.6 g/dL (ref 3.5–5.2)
BILIRUBIN TOTAL: 0.6 mg/dL (ref 0.2–1.2)
BUN: 21 mg/dL (ref 6–23)
CO2: 25 meq/L (ref 19–32)
CREATININE: 1.3 mg/dL — AB (ref 0.4–1.2)
Calcium: 9.8 mg/dL (ref 8.4–10.5)
Chloride: 103 mEq/L (ref 96–112)
GFR: 41.83 mL/min — AB (ref >60.00)
Glucose, Bld: 73 mg/dL (ref 70–99)
Potassium: 4.4 mEq/L (ref 3.5–5.1)
Sodium: 136 mEq/L (ref 135–145)
Total Protein: 7 g/dL (ref 6.0–8.3)

## 2014-09-01 LAB — TSH: TSH: 2.12 u[IU]/mL (ref 0.35–4.50)

## 2014-09-01 MED ORDER — METOPROLOL TARTRATE 100 MG PO TABS: ORAL_TABLET | ORAL | Status: DC

## 2014-09-01 MED ORDER — SERTRALINE HCL 50 MG PO TABS: ORAL_TABLET | ORAL | Status: DC

## 2014-09-01 MED ORDER — SPIRONOLACTONE 25 MG PO TABS: ORAL_TABLET | ORAL | Status: DC

## 2014-09-01 MED ORDER — LEVOTHYROXINE SODIUM 100 MCG PO TABS: ORAL_TABLET | ORAL | Status: DC

## 2014-09-01 NOTE — Progress Notes (Signed)
Pre visit review using our clinic review tool, if applicable. No additional management support is needed unless otherwise documented below in the visit note. 

## 2014-09-01 NOTE — Patient Instructions (Signed)
No sodas for Margaret Hicks please Also encourage staff to trim her toenails  Flu vaccine given today  Don't forget to call and schedule her mammogram  Labs today

## 2014-09-01 NOTE — Assessment & Plan Note (Signed)
Labs today Diet per residence is supposed to be low fat  Will continue to follow

## 2014-09-01 NOTE — Progress Notes (Signed)
Subjective:    Patient ID: Margaret DaysElva S Hicks, female    DOB: 1931/11/25, 80 y.o.   MRN: [id number]  HPI I have personally reviewed the Medicare Annual Wellness questionnaire and have noted 1. The patient's medical and social history 2. Their use of alcohol, tobacco or illicit drugs 3. Their current medications and supplements 4. The patient's functional ability including ADL's, fall risks, home safety risks and hearing or visual             impairment. 5. Diet and physical activities 6. Evidence for depression or mood disorders  The patients weight, height, BMI have been recorded in the chart and visual acuity is per eye clinic.  I have made referrals, counseling and provided education to the patient based review of the above and I have provided the pt with a written personalized care plan for preventive services.  See scanned forms.  Routine anticipatory guidance given to patient.  See health maintenance. Colon cancer screening-sees GI for this currently - has upcoming  Breast cancer screening-had mammogram 2/14 - would like to do that  Self breast exam- no lumps  Flu vaccine- needs that  Tetanus vaccine -cannot have  Pneumovax 12/14 -- will want to have prevnar once it has been a year past that  Zoster vaccine 1/09  Advance directive- has written one up and brought today  Cognitive function addressed- see scanned forms- and if abnormal then additional documentation follows. - about the same / not improved - still very poor short term memory / no accidents or safety worries in her current residence   [facility]  Meds, vitals, and allergies reviewed.   ROS: See HPI.  Otherwise negative.     Wt is down 2 lb with bmi of 38  Appetite is ok    Hypothyroidism  Pt has no clinical changes No change in energy level/ hair or skin/ edema and no tremor Lab Results  Component Value Date   TSH 0.84 04/10/2013    Due for tsh today   bp is stable today  No cp or palpitations or  headaches or edema  No side effects to medicines  BP Readings from Last 3 Encounters:  09/01/14 118/68  08/27/14 148/74  10/21/13 126/76     Renal insuff - has been stable - sees Dr Cherylann RatelLateef Needed order to quit sodas at her residence  No utis lately   Mood has been fairly good lately   Patient Active Problem List   Diagnosis Date Noted  . Internal hemorrhoids 08/27/2014  . Callus of foot 08/12/2013  . Pedal edema 08/12/2013  . Orthostatic hypotension 05/29/2013  . Dysphagia, unspecified(787.20) 05/29/2013  . Aneurysm, splenic artery 05/29/2013  . Senile dementia, uncomplicated 01/09/2013  . Back pain, thoracic 11/28/2012  . Depression 11/28/2012  . Other screening mammogram 11/28/2012  . Vertigo 11/18/2011  . EXTERNAL HEMORRHOIDS 01/26/2011  . GERD 09/01/2010  . Hypothyroidism 05/27/2010  . Hyperlipidemia LDL goal <100 05/27/2010  . Essential hypertension 05/27/2010  . Disorder resulting from impaired renal function 05/27/2010  . Unspecified urinary incontinence 05/27/2010  . UTI'S, HX OF 05/27/2010   Past Medical History  Diagnosis Date  . Hypothyroidism   . Urinary incontinence   . Hypertension   . Arthritis   . Esophageal reflux   . Obesity   . Renal insufficiency   . OP (osteoporosis)   . TIA (transient ischemic attack)   . Upper GI bleed   . Hyperlipemia   . Hemorrhoids   .  Acute gastritis   . Schatzki's ring    Past Surgical History  Procedure Laterality Date  . Cardiac catheterization    . Cholecystectomy    . Tonsillectomy    . Abdominal hysterectomy     History  Substance Use Topics  . Smoking status: Never Smoker   . Smokeless tobacco: Never Used  . Alcohol Use: No   Family History  Problem Relation Age of Onset  . Dementia Mother   . Stroke Mother   . Arthritis Sister   . Dementia Brother   . Cancer      nephew  . Colon cancer Neg Hx   . Heart disease Father   . Dementia Brother    Allergies  Allergen Reactions  . Simvastatin       REACTION: muscle pain/ memory issues   Current Outpatient Prescriptions on File Prior to Visit  Medication Sig Dispense Refill  . amLODipine-valsartan (EXFORGE) 5-320 MG per tablet Take 1 tablet by mouth daily.  90 tablet  0  . cholecalciferol (VITAMIN D) 1000 UNITS tablet Take 1,000 Units by mouth every evening.       Marland Kitchen dexlansoprazole (DEXILANT) 60 MG capsule Take 1 capsule (60 mg total) by mouth daily.  60 capsule  11  . docusate sodium (COLACE) 50 MG capsule Take by mouth daily.      . Flaxseed, Linseed, (FLAX SEED OIL) 1000 MG CAPS Take 1 capsule by mouth 2 (two) times daily.        Marland Kitchen levothyroxine (SYNTHROID, LEVOTHROID) 100 MCG tablet TAKE 1 TABLET BY MOUTH EVERY DAY  90 tablet  0  . metoprolol (LOPRESSOR) 100 MG tablet TAKE 1 TABLET BY MOUTH TWICE DAILY  180 tablet  3  . Multiple Vitamin (MULTIVITAMIN) capsule Take 1 capsule by mouth daily.        . Probiotic Product (PROBIOTIC FORMULA PO) Take by mouth daily.       . sertraline (ZOLOFT) 50 MG tablet TAKE 1 TABLET (50 MG TOTAL) BY MOUTH DAILY.  90 tablet  1  . spironolactone (ALDACTONE) 25 MG tablet TAKE 1/2 TABLET BY MOUTH EVERY DAY  45 tablet  0   No current facility-administered medications on file prior to visit.       Review of Systems Review of Systems  Constitutional: Negative for fever, appetite change, fatigue and unexpected weight change.  Eyes: Negative for pain and visual disturbance.  Respiratory: Negative for cough and shortness of breath.   Cardiovascular: Negative for cp or palpitations    Gastrointestinal: Negative for nausea, diarrhea and constipation.  Genitourinary: Negative for urgency and frequency.  Skin: Negative for pallor or rash   Neurological: Negative for weakness, light-headedness, numbness and headaches.  Hematological: Negative for adenopathy. Does not bruise/bleed easily.  Psychiatric/Behavioral: Negative for dysphoric mood. The patient is nervous/anxious.  pos for dementia         Objective:   Physical Exam  Constitutional: She appears well-developed and well-nourished. No distress.  obese and well appearing   HENT:  Head: Normocephalic and atraumatic.  Right Ear: External ear normal.  Left Ear: External ear normal.  Mouth/Throat: Oropharynx is clear and moist.  Eyes: Conjunctivae and EOM are normal. Pupils are equal, round, and reactive to light. No scleral icterus.  Neck: Normal range of motion. Neck supple. No JVD present. Carotid bruit is not present. No thyromegaly present.  Cardiovascular: Normal rate, regular rhythm, normal heart sounds and intact distal pulses.  Exam reveals no gallop.   Pulmonary/Chest: Effort  normal and breath sounds normal. No respiratory distress. She has no wheezes. She exhibits no tenderness.  Abdominal: Soft. Bowel sounds are normal. She exhibits no distension, no abdominal bruit and no mass. There is no tenderness.  Genitourinary: No breast swelling, tenderness, discharge or bleeding.  Musculoskeletal: Normal range of motion. She exhibits no edema and no tenderness.  Lymphadenopathy:    She has no cervical adenopathy.  Neurological: She is alert. She has normal reflexes. No cranial nerve deficit. She exhibits normal muscle tone. Coordination normal.  Skin: Skin is warm and dry. No rash noted. No erythema. No pallor.  Psychiatric: Her mood appears anxious. Her speech is tangential. Thought content is not paranoid. Cognition and memory are impaired. She does not exhibit a depressed mood. She exhibits abnormal recent memory.  Pleasant  Tangential/ confuses easily with poor short term memory          Assessment & Plan:   Problem List Items Addressed This Visit     Cardiovascular and Mediastinum   Essential hypertension      bp in fair control at this time  BP Readings from Last 1 Encounters:  09/01/14 118/68   No changes needed Disc lifstyle change with low sodium diet and exercise  Labs today  Also followed by renal      Relevant Medications      spironolactone (ALDACTONE) tablet      metoprolol (LOPRESSOR) tablet   Other Relevant Orders      CBC with Differential (Completed)      Comprehensive metabolic panel (Completed)      TSH (Completed)      Lipid panel (Completed)     Endocrine   Hypothyroidism     tsh today  No clinical changes  Did refill her levothyroxine (will change if tsh if off) No change in exam    Relevant Medications      metoprolol (LOPRESSOR) tablet      levothyroxine (SYNTHROID, LEVOTHROID) tablet   Other Relevant Orders      TSH (Completed)     Nervous and Auditory   Senile dementia, uncomplicated     Stable per family  She enjoys socializing and stays mentally active all the time- this has been very good for her  No med currently in light of renal status  Will continue to follow     Relevant Medications      sertraline (ZOLOFT) tablet     Genitourinary   Disorder resulting from impaired renal function     Continue f/u Dr Cherylann RatelLateef Very good control of HTN currently Lab today      Other   Hyperlipidemia LDL goal <100     Labs today Diet per residence is supposed to be low fat  Will continue to follow     Relevant Medications      spironolactone (ALDACTONE) tablet      metoprolol (LOPRESSOR) tablet   Other Relevant Orders      Lipid panel (Completed)   Encounter for Medicare annual wellness exam - Primary     Reviewed health habits including diet and exercise and skin cancer prevention Reviewed appropriate screening tests for age  Also reviewed health mt list, fam hx and immunization status , as well as social and family history    Labs today  See HPI Flu vaccine today Will plan on prevnar after December  Her [place]  Following her dementia - safety has not been an issue at her residence  Her advanced directive papers were rev and will scan into chart - disc POA (sons) - verified that she is not competent to make  decisions due to dementia      Other Visit Diagnoses   Need for influenza vaccination        Relevant Orders       Flu Vaccine QUAD 36+ mos PF IM (Fluarix Quad PF) (Completed)

## 2014-09-01 NOTE — Assessment & Plan Note (Signed)
bp in fair control at this time  BP Readings from Last 1 Encounters:  09/01/14 118/68   No changes needed Disc lifstyle change with low sodium diet and exercise  Labs today  Also followed by renal

## 2014-09-01 NOTE — Assessment & Plan Note (Signed)
Continue f/u Dr Cherylann RatelLateef Very good control of HTN currently Lab today

## 2014-09-01 NOTE — Assessment & Plan Note (Signed)
tsh today  No clinical changes  Did refill her levothyroxine (will change if tsh if off) No change in exam

## 2014-09-01 NOTE — Assessment & Plan Note (Addendum)
Reviewed health habits including diet and exercise and skin cancer prevention Reviewed appropriate screening tests for age  Also reviewed health mt list, fam hx and immunization status , as well as social and family history    Labs today  See HPI Flu vaccine today Will plan on prevnar after December  Her [place]  Following her dementia - safety has not been an issue at her residence  Her advanced directive papers were rev and will scan into chart - disc POA (sons) - verified that she is not competent to make decisions due to dementia

## 2014-09-01 NOTE — Assessment & Plan Note (Signed)
Stable per family  She enjoys socializing and stays mentally active all the time- this has been very good for her  No med currently in light of renal status  Will continue to follow

## 2014-09-02 ENCOUNTER — Telehealth: Payer: Self-pay | Admitting: Family Medicine

## 2014-09-02 NOTE — Telephone Encounter (Signed)
emmi mailed  °

## 2014-09-09 ENCOUNTER — Emergency Department (HOSPITAL_COMMUNITY): Payer: Medicare Other

## 2014-09-09 ENCOUNTER — Encounter (HOSPITAL_COMMUNITY): Payer: Self-pay | Admitting: Emergency Medicine

## 2014-09-09 ENCOUNTER — Emergency Department (HOSPITAL_COMMUNITY)
Admission: EM | Admit: 2014-09-09 | Discharge: 2014-09-09 | Disposition: A | Discharge status: Home / Self Care | Payer: Medicare Other | Attending: Emergency Medicine | Admitting: Emergency Medicine

## 2014-09-09 DIAGNOSIS — Z8673 Personal history of transient ischemic attack (TIA), and cerebral infarction without residual deficits: Secondary | ICD-10-CM | POA: Diagnosis not present

## 2014-09-09 DIAGNOSIS — E039 Hypothyroidism, unspecified: Secondary | ICD-10-CM | POA: Diagnosis not present

## 2014-09-09 DIAGNOSIS — Y9289 Other specified places as the place of occurrence of the external cause: Secondary | ICD-10-CM | POA: Diagnosis not present

## 2014-09-09 DIAGNOSIS — I10 Essential (primary) hypertension: Secondary | ICD-10-CM | POA: Insufficient documentation

## 2014-09-09 DIAGNOSIS — M79606 Pain in leg, unspecified: Secondary | ICD-10-CM

## 2014-09-09 DIAGNOSIS — S8992XA Unspecified injury of left lower leg, initial encounter: Secondary | ICD-10-CM | POA: Diagnosis not present

## 2014-09-09 DIAGNOSIS — Z87448 Personal history of other diseases of urinary system: Secondary | ICD-10-CM | POA: Diagnosis not present

## 2014-09-09 DIAGNOSIS — M79661 Pain in right lower leg: Secondary | ICD-10-CM | POA: Diagnosis not present

## 2014-09-09 DIAGNOSIS — M79662 Pain in left lower leg: Secondary | ICD-10-CM | POA: Diagnosis not present

## 2014-09-09 DIAGNOSIS — F0390 Unspecified dementia without behavioral disturbance: Secondary | ICD-10-CM | POA: Diagnosis not present

## 2014-09-09 DIAGNOSIS — Z8739 Personal history of other diseases of the musculoskeletal system and connective tissue: Secondary | ICD-10-CM | POA: Insufficient documentation

## 2014-09-09 DIAGNOSIS — Z79899 Other long term (current) drug therapy: Secondary | ICD-10-CM | POA: Insufficient documentation

## 2014-09-09 DIAGNOSIS — K219 Gastro-esophageal reflux disease without esophagitis: Secondary | ICD-10-CM | POA: Insufficient documentation

## 2014-09-09 DIAGNOSIS — S8991XA Unspecified injury of right lower leg, initial encounter: Secondary | ICD-10-CM | POA: Diagnosis not present

## 2014-09-09 DIAGNOSIS — W1830XA Fall on same level, unspecified, initial encounter: Secondary | ICD-10-CM | POA: Diagnosis not present

## 2014-09-09 DIAGNOSIS — M79604 Pain in right leg: Secondary | ICD-10-CM | POA: Diagnosis not present

## 2014-09-09 HISTORY — DX: Dorsalgia, unspecified: M54.9

## 2014-09-09 HISTORY — DX: Gastro-esophageal reflux disease without esophagitis: K21.9

## 2014-09-09 HISTORY — DX: Unspecified dementia, unspecified severity, without behavioral disturbance, psychotic disturbance, mood disturbance, and anxiety: F03.90

## 2014-09-09 MED ORDER — TRAMADOL HCL 50 MG PO TABS: 50.0000 mg | ORAL_TABLET | Freq: Four times a day (QID) | ORAL | Status: DC | PRN

## 2014-09-09 MED ORDER — TRAMADOL HCL 50 MG PO TABS
100.0000 mg | ORAL_TABLET | Freq: Once | ORAL | Status: AC
  Administered 2014-09-09: 100 mg via ORAL
  Filled 2014-09-09: qty 2

## 2014-09-09 NOTE — ED Notes (Addendum)
[facility] with report from staff of pt c/o right shin pain with movement and palpation.  Pt reported to staff she fell last night but didn't think anything of it at the time.  Pt in NAD, alert, oriented at baseline.

## 2014-09-09 NOTE — Discharge Instructions (Signed)
Give her the tramadol every 6 hrs for pain. Let her use a wheelchair for a few days. Have her rechecked if her pain seems worse or if she isn't improving in the next couple of days.

## 2014-09-09 NOTE — ED Notes (Signed)
Patient was unable to stand up. She said movement made her legs hurt to bad. She said she couldn't even try.

## 2014-09-09 NOTE — ED Notes (Signed)
Spoke with Cornelius MorasSandy Brown, RN at patient's facility to confirm patient care up until this point.

## 2014-09-09 NOTE — ED Notes (Signed)
MD Knapp at the bedside  

## 2014-09-09 NOTE — ED Notes (Signed)
Took over care of this patient at this time.

## 2014-09-09 NOTE — ED Provider Notes (Signed)
CSN: [id number]     Arrival date & time 09/09/14  [id number] History   First MD Initiated Contact with Patient 09/09/14 [phone]     Chief Complaint  Patient presents with  . Fall  . Leg Pain   Level V caveat because of dementia  (Consider location/radiation/quality/duration/timing/severity/associated sxs/prior Treatment) HPI Patient has dementia and is in an assisted-living facility. They report to EMS the patient is complaining of pain in her right lower extremity this morning. They state she had said she fell, however her son states when she has fallen in the past he has had to help her get up. Patient has confusion. Sometimes she uses words wrong. She is unable to express to me if she is having pain or where her pain is located. Her son, who is one of her POA's, is here and states she normally does not complain of pain except for her chronic back pain.  PCP Dr Milinda Antisower  Past Medical History  Diagnosis Date  . Hypothyroidism   . Urinary incontinence   . Hypertension   . Arthritis   . Esophageal reflux   . Obesity   . Renal insufficiency   . OP (osteoporosis)   . TIA (transient ischemic attack)   . Upper GI bleed   . Hyperlipemia   . Hemorrhoids   . Acute gastritis   . Schatzki's ring   . Back pain   . GERD (gastroesophageal reflux disease)   . Dementia    Past Surgical History  Procedure Laterality Date  . Cardiac catheterization    . Cholecystectomy    . Tonsillectomy    . Abdominal hysterectomy     Family History  Problem Relation Age of Onset  . Dementia Mother   . Stroke Mother   . Arthritis Sister   . Dementia Brother   . Cancer      nephew  . Colon cancer Neg Hx   . Heart disease Father   . Dementia Brother    History  Substance Use Topics  . Smoking status: Never Smoker   . Smokeless tobacco: Never Used  . Alcohol Use: No   Lives in ALF Uses a walker  OB History   Grav Para Term Preterm Abortions TAB SAB Ect Mult Living                 Review of  Systems  Unable to perform ROS: Dementia      Allergies  Simvastatin  Home Medications   Prior to Admission medications   Medication Sig Start Date End Date Taking? Authorizing Provider  amLODipine-valsartan (EXFORGE) 5-320 MG per tablet Take 1 tablet by mouth daily. 05/20/14   Judy PimpleMarne A Tower, MD  cholecalciferol (VITAMIN D) 1000 UNITS tablet Take 1,000 Units by mouth every evening.     Historical Provider, MD  dexlansoprazole (DEXILANT) 60 MG capsule Take 1 capsule (60 mg total) by mouth daily. 08/27/14   Louis Meckelobert D Kaplan, MD  docusate sodium (COLACE) 50 MG capsule Take by mouth daily.    Historical Provider, MD  Flaxseed, Linseed, (FLAX SEED OIL) 1000 MG CAPS Take 1 capsule by mouth 2 (two) times daily.      Historical Provider, MD  levothyroxine (SYNTHROID, LEVOTHROID) 100 MCG tablet TAKE 1 TABLET BY MOUTH EVERY DAY 09/01/14   Judy PimpleMarne A Tower, MD  metoprolol (LOPRESSOR) 100 MG tablet TAKE 1 TABLET BY MOUTH TWICE DAILY 09/01/14   Judy PimpleMarne A Tower, MD  Multiple Vitamin (MULTIVITAMIN) capsule Take 1 capsule by mouth daily.  Historical Provider, MD  Probiotic Product (PROBIOTIC FORMULA PO) Take by mouth daily.     Historical Provider, MD  sertraline (ZOLOFT) 50 MG tablet TAKE 1 TABLET (50 MG TOTAL) BY MOUTH DAILY. 09/01/14   Judy Pimple, MD  spironolactone (ALDACTONE) 25 MG tablet TAKE 1/2 TABLET BY MOUTH EVERY DAY 09/01/14   Judy Pimple, MD   BP 128/61  Pulse 58  Temp(Src) 97.7 F (36.5 C) (Oral)  SpO2 100%  Vital signs normal except bradycardia  Physical Exam  Nursing note and vitals reviewed. Constitutional: She is oriented to person, place, and time. She appears well-developed and well-nourished.  Non-toxic appearance. She does not appear ill. No distress.  HENT:  Head: Normocephalic and atraumatic.  Right Ear: External ear normal.  Left Ear: External ear normal.  Nose: Nose normal. No mucosal edema or rhinorrhea.  Mouth/Throat: Oropharynx is clear and moist and mucous  membranes are normal. No dental abscesses or uvula swelling.  Eyes: Conjunctivae and EOM are normal. Pupils are equal, round, and reactive to light.  Neck: Normal range of motion and full passive range of motion without pain. Neck supple.  Cardiovascular: Normal rate, regular rhythm and normal heart sounds.  Exam reveals no gallop and no friction rub.   No murmur heard. Pulmonary/Chest: Effort normal and breath sounds normal. No respiratory distress. She has no wheezes. She has no rhonchi. She has no rales. She exhibits no tenderness and no crepitus.  Abdominal: Soft. Normal appearance and bowel sounds are normal. She exhibits no distension. There is no tenderness. There is no rebound and no guarding.  Musculoskeletal: Normal range of motion. She exhibits no edema and no tenderness.  Moves all extremities well. Patient has diffuse tenderness to both legs to palpation. There is no obvious abrasion, bruising, or swelling. Her tibia feels intact without crepitance. She is noted to have flat feet and external rotation of her feet. She has superficial varicosities around her ankles the right is worse than the left.  Neurological: She is alert and oriented to person, place, and time. She has normal strength. No cranial nerve deficit.  Awake, cooperative, confused Patient is moving all extremities equally. She has no obvious motor deficit. She does not appear to have pain on range of motion of her extremities. Sometimes patient has trouble following commands which I believe is due to her dementia.  Skin: Skin is warm, dry and intact. No rash noted. No erythema. No pallor.  Psychiatric: She has a normal mood and affect. Her speech is normal and behavior is normal. Cognition and memory are impaired. She exhibits abnormal recent memory and abnormal remote memory.  Patient is a pleasant, smiling. Sometimes she has trouble following commands.    ED Course  Procedures (including critical care  time)  Medications  traMADol (ULTRAM) tablet 100 mg (not administered)     Son given results of her xray. We will try to stand patient at bedside to make sure she is able to stand.  11:50 Nurses report pt tearful and c/o pain in her thighs when she stands. Will order more xrays.   13:55 Son given the results of her xrays. Will let her go back to her facility and she can use a wheelchair for a few days. I did not pursue further testing because her pain is nonspecific and non-localizing. She has no pain on ROM of her extremities.    Labs Review Labs Reviewed - No data to display  Imaging Review Dg Femur Left  09/09/2014   CLINICAL DATA:  Larey SeatFell.  Left thigh pain.  EXAM: LEFT FEMUR - 2 VIEW  COMPARISON:  None.  FINDINGS: The knee and hip joints are maintained. There are degenerative changes and chondrocalcinosis. No acute fracture or bone lesion. No knee joint effusion.  IMPRESSION: No acute bony findings or destructive bony changes.  Chondrocalcinosis.   Electronically Signed   By: Loralie ChampagneMark  Gallerani M.D.   On: 09/09/2014 13:38   Dg Femur Right  09/09/2014   CLINICAL DATA:  Right leg pain after fall.  EXAM: RIGHT FEMUR - 2 VIEW  COMPARISON:  None.  FINDINGS: There is no evidence of fracture or other focal bone lesions. Soft tissues are unremarkable.  IMPRESSION: Normal right femur.   Electronically Signed   By: Roque LiasJames  Green M.D.   On: 09/09/2014 13:35   Dg Tibia/fibula Left  09/09/2014   CLINICAL DATA:  Status post fall last evening with acute right lower leg pain; left leg with history of previous injury  EXAM: LEFT TIBIA AND FIBULA - 2 VIEW  COMPARISON:  None.  FINDINGS: There is old deformity of the a proximal third of the diaphysis of the fibula consistent with previous fracture which has healed with deformity. The adjacent tibia is intact. There is mild degenerative change of the knee manifested by beaking of the tibial spines. There is moderate degenerative change of the tibiotalar  articulation. No acute fracture of the ankle is demonstrated. There is a pes planus contour of the foot where visualized. The soft tissues of the lower leg exhibit numerous phleboliths.  IMPRESSION: There is no acute fracture of the left tibia or fibula. Attic prior fracture has healed with deformity involving the proximal third of the shaft of the left fibula. There is moderate degenerative change of the left ankle.   Electronically Signed   By: David  SwazilandJordan   On: 09/09/2014 11:09   Dg Tibia/fibula Right  09/09/2014   CLINICAL DATA:  Status post fall last evening with complaints of right-sided lower extremity pain  EXAM: RIGHT TIBIA AND FIBULA - 2 VIEW  COMPARISON:  Left tibia and fibula of today's date  FINDINGS: The bones are mildly osteopenic. There is no acute fracture of the tibia or fibula. There are moderate degenerative changes involving the medial and lateral joint compartments of the knee. The observed portions of the ankle exhibit no acute abnormalities. There are phleboliths within the soft tissues.  IMPRESSION: There is no acute bony abnormality of the right tibia or fibula.   Electronically Signed   By: David  [place]azil[place]Jordan   On: 09/09/2014 11:07     EKG Interpretation None      MDM   Final diagnoses:  Pain of lower extremity, unspecified laterality    New Prescriptions   TRAMADOL (ULTRAM) 50 MG TABLET    Take 1 tablet (50 mg total) by mouth every 6 (six) hours as needed for moderate pain.    Plan discharge   Devoria AlbeIva Pate Aylward, MD, Franz DellFACEP     Blakely Gluth L Tisha Cline, MD 09/09/14 [phone]

## 2014-09-09 NOTE — ED Notes (Signed)
Patient placed on the bed pan. Patient able to lift hips by herself. Patient complains of pain in her upper legs. Patient family at the bedside.

## 2014-09-09 NOTE — ED Notes (Signed)
Patient returned from X-ray 

## 2014-09-11 DIAGNOSIS — F329 Major depressive disorder, single episode, unspecified: Secondary | ICD-10-CM | POA: Diagnosis not present

## 2014-09-11 DIAGNOSIS — M159 Polyosteoarthritis, unspecified: Secondary | ICD-10-CM | POA: Diagnosis not present

## 2014-09-11 DIAGNOSIS — M545 Low back pain: Secondary | ICD-10-CM | POA: Diagnosis not present

## 2014-09-11 DIAGNOSIS — R278 Other lack of coordination: Secondary | ICD-10-CM | POA: Diagnosis not present

## 2014-09-11 DIAGNOSIS — Z9181 History of falling: Secondary | ICD-10-CM | POA: Diagnosis not present

## 2014-09-11 DIAGNOSIS — I129 Hypertensive chronic kidney disease with stage 1 through stage 4 chronic kidney disease, or unspecified chronic kidney disease: Secondary | ICD-10-CM | POA: Diagnosis not present

## 2014-09-11 DIAGNOSIS — F0281 Dementia in other diseases classified elsewhere with behavioral disturbance: Secondary | ICD-10-CM | POA: Diagnosis not present

## 2014-09-11 DIAGNOSIS — E785 Hyperlipidemia, unspecified: Secondary | ICD-10-CM | POA: Diagnosis not present

## 2014-09-11 DIAGNOSIS — G309 Alzheimer's disease, unspecified: Secondary | ICD-10-CM | POA: Diagnosis not present

## 2014-09-11 DIAGNOSIS — G8911 Acute pain due to trauma: Secondary | ICD-10-CM | POA: Diagnosis not present

## 2014-09-11 DIAGNOSIS — N189 Chronic kidney disease, unspecified: Secondary | ICD-10-CM | POA: Diagnosis not present

## 2014-09-11 DIAGNOSIS — R2689 Other abnormalities of gait and mobility: Secondary | ICD-10-CM | POA: Diagnosis not present

## 2014-09-11 DIAGNOSIS — E039 Hypothyroidism, unspecified: Secondary | ICD-10-CM | POA: Diagnosis not present

## 2014-09-16 DIAGNOSIS — M545 Low back pain: Secondary | ICD-10-CM | POA: Diagnosis not present

## 2014-09-16 DIAGNOSIS — G309 Alzheimer's disease, unspecified: Secondary | ICD-10-CM | POA: Diagnosis not present

## 2014-09-16 DIAGNOSIS — G8911 Acute pain due to trauma: Secondary | ICD-10-CM | POA: Diagnosis not present

## 2014-09-16 DIAGNOSIS — M159 Polyosteoarthritis, unspecified: Secondary | ICD-10-CM | POA: Diagnosis not present

## 2014-09-16 DIAGNOSIS — I129 Hypertensive chronic kidney disease with stage 1 through stage 4 chronic kidney disease, or unspecified chronic kidney disease: Secondary | ICD-10-CM | POA: Diagnosis not present

## 2014-09-16 DIAGNOSIS — F0281 Dementia in other diseases classified elsewhere with behavioral disturbance: Secondary | ICD-10-CM | POA: Diagnosis not present

## 2014-09-17 ENCOUNTER — Telehealth: Payer: Self-pay

## 2014-09-17 ENCOUNTER — Encounter: Payer: Self-pay | Admitting: Family Medicine

## 2014-09-17 DIAGNOSIS — G309 Alzheimer's disease, unspecified: Secondary | ICD-10-CM | POA: Diagnosis not present

## 2014-09-17 DIAGNOSIS — M159 Polyosteoarthritis, unspecified: Secondary | ICD-10-CM | POA: Diagnosis not present

## 2014-09-17 DIAGNOSIS — M545 Low back pain: Secondary | ICD-10-CM | POA: Diagnosis not present

## 2014-09-17 DIAGNOSIS — Z7409 Other reduced mobility: Secondary | ICD-10-CM | POA: Insufficient documentation

## 2014-09-17 DIAGNOSIS — F0281 Dementia in other diseases classified elsewhere with behavioral disturbance: Secondary | ICD-10-CM | POA: Diagnosis not present

## 2014-09-17 DIAGNOSIS — I129 Hypertensive chronic kidney disease with stage 1 through stage 4 chronic kidney disease, or unspecified chronic kidney disease: Secondary | ICD-10-CM | POA: Diagnosis not present

## 2014-09-17 DIAGNOSIS — G8911 Acute pain due to trauma: Secondary | ICD-10-CM | POA: Diagnosis not present

## 2014-09-17 NOTE — Telephone Encounter (Signed)
[name] with [facility] left v/m requesting verbal orders for home health PT 2 x a week for 5 weeks. Pt cannot walk and is renting w/c and Antonio request w/c prescription for pt. Antonio request cb.

## 2014-09-17 NOTE — Telephone Encounter (Signed)
Please ok the verbal order Px for WC in IN box

## 2014-09-18 DIAGNOSIS — G8911 Acute pain due to trauma: Secondary | ICD-10-CM | POA: Diagnosis not present

## 2014-09-18 DIAGNOSIS — M545 Low back pain: Secondary | ICD-10-CM | POA: Diagnosis not present

## 2014-09-18 DIAGNOSIS — I129 Hypertensive chronic kidney disease with stage 1 through stage 4 chronic kidney disease, or unspecified chronic kidney disease: Secondary | ICD-10-CM | POA: Diagnosis not present

## 2014-09-18 DIAGNOSIS — G309 Alzheimer's disease, unspecified: Secondary | ICD-10-CM | POA: Diagnosis not present

## 2014-09-18 DIAGNOSIS — F0281 Dementia in other diseases classified elsewhere with behavioral disturbance: Secondary | ICD-10-CM | POA: Diagnosis not present

## 2014-09-18 DIAGNOSIS — M159 Polyosteoarthritis, unspecified: Secondary | ICD-10-CM | POA: Diagnosis not present

## 2014-09-18 NOTE — Telephone Encounter (Signed)
Spoke with daughter Margaret Hicks before giving the verbal orders and she is okay with giving verbal order for Home Health PT but she wanted to hold off on the wheelchair order for now, pt's family is in the process of trying to find a use wheelchair for cheaper then the new one  [facility]

## 2014-09-19 DIAGNOSIS — M159 Polyosteoarthritis, unspecified: Secondary | ICD-10-CM | POA: Diagnosis not present

## 2014-09-19 DIAGNOSIS — M545 Low back pain: Secondary | ICD-10-CM | POA: Diagnosis not present

## 2014-09-19 DIAGNOSIS — G309 Alzheimer's disease, unspecified: Secondary | ICD-10-CM | POA: Diagnosis not present

## 2014-09-19 DIAGNOSIS — F0281 Dementia in other diseases classified elsewhere with behavioral disturbance: Secondary | ICD-10-CM | POA: Diagnosis not present

## 2014-09-19 DIAGNOSIS — G8911 Acute pain due to trauma: Secondary | ICD-10-CM | POA: Diagnosis not present

## 2014-09-19 DIAGNOSIS — I129 Hypertensive chronic kidney disease with stage 1 through stage 4 chronic kidney disease, or unspecified chronic kidney disease: Secondary | ICD-10-CM | POA: Diagnosis not present

## 2014-09-22 DIAGNOSIS — G309 Alzheimer's disease, unspecified: Secondary | ICD-10-CM | POA: Diagnosis not present

## 2014-09-22 DIAGNOSIS — G8911 Acute pain due to trauma: Secondary | ICD-10-CM | POA: Diagnosis not present

## 2014-09-22 DIAGNOSIS — M159 Polyosteoarthritis, unspecified: Secondary | ICD-10-CM | POA: Diagnosis not present

## 2014-09-22 DIAGNOSIS — I129 Hypertensive chronic kidney disease with stage 1 through stage 4 chronic kidney disease, or unspecified chronic kidney disease: Secondary | ICD-10-CM | POA: Diagnosis not present

## 2014-09-22 DIAGNOSIS — M545 Low back pain: Secondary | ICD-10-CM | POA: Diagnosis not present

## 2014-09-22 DIAGNOSIS — F0281 Dementia in other diseases classified elsewhere with behavioral disturbance: Secondary | ICD-10-CM | POA: Diagnosis not present

## 2014-09-23 ENCOUNTER — Telehealth: Payer: Self-pay | Admitting: *Deleted

## 2014-09-23 DIAGNOSIS — F0281 Dementia in other diseases classified elsewhere with behavioral disturbance: Secondary | ICD-10-CM

## 2014-09-23 DIAGNOSIS — G8911 Acute pain due to trauma: Secondary | ICD-10-CM

## 2014-09-23 DIAGNOSIS — G309 Alzheimer's disease, unspecified: Secondary | ICD-10-CM | POA: Diagnosis not present

## 2014-09-23 DIAGNOSIS — M159 Polyosteoarthritis, unspecified: Secondary | ICD-10-CM | POA: Diagnosis not present

## 2014-09-23 NOTE — Telephone Encounter (Signed)
Received fax saying that pt's assisted living is changing pharmacy to [facility], will update once we receive a refill request from them to make sure correct pharmacy is on file, fax sent to scanning

## 2014-09-24 DIAGNOSIS — F0281 Dementia in other diseases classified elsewhere with behavioral disturbance: Secondary | ICD-10-CM | POA: Diagnosis not present

## 2014-09-24 DIAGNOSIS — I129 Hypertensive chronic kidney disease with stage 1 through stage 4 chronic kidney disease, or unspecified chronic kidney disease: Secondary | ICD-10-CM | POA: Diagnosis not present

## 2014-09-24 DIAGNOSIS — M545 Low back pain: Secondary | ICD-10-CM | POA: Diagnosis not present

## 2014-09-24 DIAGNOSIS — G8911 Acute pain due to trauma: Secondary | ICD-10-CM | POA: Diagnosis not present

## 2014-09-24 DIAGNOSIS — G309 Alzheimer's disease, unspecified: Secondary | ICD-10-CM | POA: Diagnosis not present

## 2014-09-24 DIAGNOSIS — M159 Polyosteoarthritis, unspecified: Secondary | ICD-10-CM | POA: Diagnosis not present

## 2014-09-26 DIAGNOSIS — M159 Polyosteoarthritis, unspecified: Secondary | ICD-10-CM | POA: Diagnosis not present

## 2014-09-26 DIAGNOSIS — F0281 Dementia in other diseases classified elsewhere with behavioral disturbance: Secondary | ICD-10-CM | POA: Diagnosis not present

## 2014-09-26 DIAGNOSIS — I129 Hypertensive chronic kidney disease with stage 1 through stage 4 chronic kidney disease, or unspecified chronic kidney disease: Secondary | ICD-10-CM | POA: Diagnosis not present

## 2014-09-26 DIAGNOSIS — M545 Low back pain: Secondary | ICD-10-CM | POA: Diagnosis not present

## 2014-09-26 DIAGNOSIS — G309 Alzheimer's disease, unspecified: Secondary | ICD-10-CM | POA: Diagnosis not present

## 2014-09-26 DIAGNOSIS — G8911 Acute pain due to trauma: Secondary | ICD-10-CM | POA: Diagnosis not present

## 2014-09-29 DIAGNOSIS — M159 Polyosteoarthritis, unspecified: Secondary | ICD-10-CM | POA: Diagnosis not present

## 2014-09-29 DIAGNOSIS — I129 Hypertensive chronic kidney disease with stage 1 through stage 4 chronic kidney disease, or unspecified chronic kidney disease: Secondary | ICD-10-CM | POA: Diagnosis not present

## 2014-09-29 DIAGNOSIS — M545 Low back pain: Secondary | ICD-10-CM | POA: Diagnosis not present

## 2014-09-29 DIAGNOSIS — G309 Alzheimer's disease, unspecified: Secondary | ICD-10-CM | POA: Diagnosis not present

## 2014-09-29 DIAGNOSIS — G8911 Acute pain due to trauma: Secondary | ICD-10-CM | POA: Diagnosis not present

## 2014-09-29 DIAGNOSIS — F0281 Dementia in other diseases classified elsewhere with behavioral disturbance: Secondary | ICD-10-CM | POA: Diagnosis not present

## 2014-09-30 DIAGNOSIS — G309 Alzheimer's disease, unspecified: Secondary | ICD-10-CM | POA: Diagnosis not present

## 2014-09-30 DIAGNOSIS — G8911 Acute pain due to trauma: Secondary | ICD-10-CM | POA: Diagnosis not present

## 2014-09-30 DIAGNOSIS — F0281 Dementia in other diseases classified elsewhere with behavioral disturbance: Secondary | ICD-10-CM | POA: Diagnosis not present

## 2014-09-30 DIAGNOSIS — I129 Hypertensive chronic kidney disease with stage 1 through stage 4 chronic kidney disease, or unspecified chronic kidney disease: Secondary | ICD-10-CM | POA: Diagnosis not present

## 2014-09-30 DIAGNOSIS — M545 Low back pain: Secondary | ICD-10-CM | POA: Diagnosis not present

## 2014-09-30 DIAGNOSIS — M159 Polyosteoarthritis, unspecified: Secondary | ICD-10-CM | POA: Diagnosis not present

## 2014-10-01 DIAGNOSIS — F0281 Dementia in other diseases classified elsewhere with behavioral disturbance: Secondary | ICD-10-CM | POA: Diagnosis not present

## 2014-10-01 DIAGNOSIS — M159 Polyosteoarthritis, unspecified: Secondary | ICD-10-CM | POA: Diagnosis not present

## 2014-10-01 DIAGNOSIS — I129 Hypertensive chronic kidney disease with stage 1 through stage 4 chronic kidney disease, or unspecified chronic kidney disease: Secondary | ICD-10-CM | POA: Diagnosis not present

## 2014-10-01 DIAGNOSIS — G309 Alzheimer's disease, unspecified: Secondary | ICD-10-CM | POA: Diagnosis not present

## 2014-10-01 DIAGNOSIS — M545 Low back pain: Secondary | ICD-10-CM | POA: Diagnosis not present

## 2014-10-01 DIAGNOSIS — G8911 Acute pain due to trauma: Secondary | ICD-10-CM | POA: Diagnosis not present

## 2014-10-01 NOTE — Telephone Encounter (Addendum)
Lawson RadarPaula Joyce left v/m requesting to get w/c order for pt.

## 2014-10-02 DIAGNOSIS — G309 Alzheimer's disease, unspecified: Secondary | ICD-10-CM | POA: Diagnosis not present

## 2014-10-02 DIAGNOSIS — M545 Low back pain: Secondary | ICD-10-CM | POA: Diagnosis not present

## 2014-10-02 DIAGNOSIS — M159 Polyosteoarthritis, unspecified: Secondary | ICD-10-CM | POA: Diagnosis not present

## 2014-10-02 DIAGNOSIS — F0281 Dementia in other diseases classified elsewhere with behavioral disturbance: Secondary | ICD-10-CM | POA: Diagnosis not present

## 2014-10-02 DIAGNOSIS — I129 Hypertensive chronic kidney disease with stage 1 through stage 4 chronic kidney disease, or unspecified chronic kidney disease: Secondary | ICD-10-CM | POA: Diagnosis not present

## 2014-10-02 DIAGNOSIS — G8911 Acute pain due to trauma: Secondary | ICD-10-CM | POA: Diagnosis not present

## 2014-10-02 NOTE — Telephone Encounter (Signed)
Lawson RadarPaula Joyce left v/m requesting cb on status for w/c order.

## 2014-10-02 NOTE — Telephone Encounter (Signed)
I had originally put her wheelchair order in [facility] box -- after they changed their mind she may have shredded it but I'm not sure, she is not in until later today and I am out of the office Let me know if I need to write a new one tomorrow

## 2014-10-02 NOTE — Telephone Encounter (Signed)
I still have the order but per daughter they wanted to find a used wheelchair that may be cheeper then getting a new one. I will call and f/u with pt's family

## 2014-10-02 NOTE — Telephone Encounter (Signed)
Spoke with pt's daughter and they would need a new order for a wheelchair. The order I have is for a standard size wheelchair and daughter said that size is to small. Pt's daughter request the next size up. She said the 22" wheelchair is a little to big but if that is the next size up she will take it. Daughter aware that Dr. Milinda Antisower not in office until tomorrow and she okay with waiting. Pt request call back once order is ready on Friday

## 2014-10-03 NOTE — Telephone Encounter (Signed)
Px done - you may have to add the inches needed - I just specified large

## 2014-10-03 NOTE — Telephone Encounter (Signed)
Daughter notified order ready for pick up 

## 2014-10-06 DIAGNOSIS — M159 Polyosteoarthritis, unspecified: Secondary | ICD-10-CM | POA: Diagnosis not present

## 2014-10-06 DIAGNOSIS — M545 Low back pain: Secondary | ICD-10-CM | POA: Diagnosis not present

## 2014-10-06 DIAGNOSIS — I129 Hypertensive chronic kidney disease with stage 1 through stage 4 chronic kidney disease, or unspecified chronic kidney disease: Secondary | ICD-10-CM | POA: Diagnosis not present

## 2014-10-06 DIAGNOSIS — G309 Alzheimer's disease, unspecified: Secondary | ICD-10-CM | POA: Diagnosis not present

## 2014-10-06 DIAGNOSIS — G8911 Acute pain due to trauma: Secondary | ICD-10-CM | POA: Diagnosis not present

## 2014-10-06 DIAGNOSIS — F0281 Dementia in other diseases classified elsewhere with behavioral disturbance: Secondary | ICD-10-CM | POA: Diagnosis not present

## 2014-10-08 DIAGNOSIS — M545 Low back pain: Secondary | ICD-10-CM | POA: Diagnosis not present

## 2014-10-08 DIAGNOSIS — G309 Alzheimer's disease, unspecified: Secondary | ICD-10-CM | POA: Diagnosis not present

## 2014-10-08 DIAGNOSIS — I129 Hypertensive chronic kidney disease with stage 1 through stage 4 chronic kidney disease, or unspecified chronic kidney disease: Secondary | ICD-10-CM | POA: Diagnosis not present

## 2014-10-08 DIAGNOSIS — G8911 Acute pain due to trauma: Secondary | ICD-10-CM | POA: Diagnosis not present

## 2014-10-08 DIAGNOSIS — M159 Polyosteoarthritis, unspecified: Secondary | ICD-10-CM | POA: Diagnosis not present

## 2014-10-08 DIAGNOSIS — F0281 Dementia in other diseases classified elsewhere with behavioral disturbance: Secondary | ICD-10-CM | POA: Diagnosis not present

## 2014-10-09 DIAGNOSIS — G309 Alzheimer's disease, unspecified: Secondary | ICD-10-CM | POA: Diagnosis not present

## 2014-10-09 DIAGNOSIS — F0281 Dementia in other diseases classified elsewhere with behavioral disturbance: Secondary | ICD-10-CM | POA: Diagnosis not present

## 2014-10-09 DIAGNOSIS — I129 Hypertensive chronic kidney disease with stage 1 through stage 4 chronic kidney disease, or unspecified chronic kidney disease: Secondary | ICD-10-CM | POA: Diagnosis not present

## 2014-10-09 DIAGNOSIS — G8911 Acute pain due to trauma: Secondary | ICD-10-CM | POA: Diagnosis not present

## 2014-10-09 DIAGNOSIS — M159 Polyosteoarthritis, unspecified: Secondary | ICD-10-CM | POA: Diagnosis not present

## 2014-10-09 DIAGNOSIS — M545 Low back pain: Secondary | ICD-10-CM | POA: Diagnosis not present

## 2014-10-13 ENCOUNTER — Telehealth: Payer: Self-pay | Admitting: *Deleted

## 2014-10-13 DIAGNOSIS — I129 Hypertensive chronic kidney disease with stage 1 through stage 4 chronic kidney disease, or unspecified chronic kidney disease: Secondary | ICD-10-CM | POA: Diagnosis not present

## 2014-10-13 DIAGNOSIS — M545 Low back pain: Secondary | ICD-10-CM | POA: Diagnosis not present

## 2014-10-13 DIAGNOSIS — F0281 Dementia in other diseases classified elsewhere with behavioral disturbance: Secondary | ICD-10-CM | POA: Diagnosis not present

## 2014-10-13 DIAGNOSIS — M159 Polyosteoarthritis, unspecified: Secondary | ICD-10-CM | POA: Diagnosis not present

## 2014-10-13 DIAGNOSIS — G8911 Acute pain due to trauma: Secondary | ICD-10-CM | POA: Diagnosis not present

## 2014-10-13 DIAGNOSIS — G309 Alzheimer's disease, unspecified: Secondary | ICD-10-CM | POA: Diagnosis not present

## 2014-10-13 NOTE — Telephone Encounter (Signed)
Please give the verbal ok for that  

## 2014-10-13 NOTE — Telephone Encounter (Signed)
[name] said pt is doing a lot better walking but she is still in wheelchair, they are trying to wean her off of using the wheelchair. Antonio request PT orders to be extended, they are requesting PT orders 2x week for 2 weeks

## 2014-10-13 NOTE — Telephone Encounter (Signed)
Verbal orders given  

## 2014-10-15 DIAGNOSIS — G309 Alzheimer's disease, unspecified: Secondary | ICD-10-CM | POA: Diagnosis not present

## 2014-10-15 DIAGNOSIS — M545 Low back pain: Secondary | ICD-10-CM | POA: Diagnosis not present

## 2014-10-15 DIAGNOSIS — I129 Hypertensive chronic kidney disease with stage 1 through stage 4 chronic kidney disease, or unspecified chronic kidney disease: Secondary | ICD-10-CM | POA: Diagnosis not present

## 2014-10-15 DIAGNOSIS — M159 Polyosteoarthritis, unspecified: Secondary | ICD-10-CM | POA: Diagnosis not present

## 2014-10-15 DIAGNOSIS — G8911 Acute pain due to trauma: Secondary | ICD-10-CM | POA: Diagnosis not present

## 2014-10-15 DIAGNOSIS — F0281 Dementia in other diseases classified elsewhere with behavioral disturbance: Secondary | ICD-10-CM | POA: Diagnosis not present

## 2014-10-18 DIAGNOSIS — M545 Low back pain: Secondary | ICD-10-CM | POA: Diagnosis not present

## 2014-10-18 DIAGNOSIS — I129 Hypertensive chronic kidney disease with stage 1 through stage 4 chronic kidney disease, or unspecified chronic kidney disease: Secondary | ICD-10-CM | POA: Diagnosis not present

## 2014-10-18 DIAGNOSIS — M159 Polyosteoarthritis, unspecified: Secondary | ICD-10-CM | POA: Diagnosis not present

## 2014-10-18 DIAGNOSIS — G8911 Acute pain due to trauma: Secondary | ICD-10-CM | POA: Diagnosis not present

## 2014-10-18 DIAGNOSIS — F0281 Dementia in other diseases classified elsewhere with behavioral disturbance: Secondary | ICD-10-CM | POA: Diagnosis not present

## 2014-10-18 DIAGNOSIS — G309 Alzheimer's disease, unspecified: Secondary | ICD-10-CM | POA: Diagnosis not present

## 2014-10-20 DIAGNOSIS — M545 Low back pain: Secondary | ICD-10-CM | POA: Diagnosis not present

## 2014-10-20 DIAGNOSIS — F0281 Dementia in other diseases classified elsewhere with behavioral disturbance: Secondary | ICD-10-CM | POA: Diagnosis not present

## 2014-10-20 DIAGNOSIS — G8911 Acute pain due to trauma: Secondary | ICD-10-CM | POA: Diagnosis not present

## 2014-10-20 DIAGNOSIS — G309 Alzheimer's disease, unspecified: Secondary | ICD-10-CM | POA: Diagnosis not present

## 2014-10-20 DIAGNOSIS — I129 Hypertensive chronic kidney disease with stage 1 through stage 4 chronic kidney disease, or unspecified chronic kidney disease: Secondary | ICD-10-CM | POA: Diagnosis not present

## 2014-10-20 DIAGNOSIS — M159 Polyosteoarthritis, unspecified: Secondary | ICD-10-CM | POA: Diagnosis not present

## 2014-10-22 DIAGNOSIS — F0281 Dementia in other diseases classified elsewhere with behavioral disturbance: Secondary | ICD-10-CM | POA: Diagnosis not present

## 2014-10-22 DIAGNOSIS — M545 Low back pain: Secondary | ICD-10-CM | POA: Diagnosis not present

## 2014-10-22 DIAGNOSIS — M159 Polyosteoarthritis, unspecified: Secondary | ICD-10-CM | POA: Diagnosis not present

## 2014-10-22 DIAGNOSIS — I129 Hypertensive chronic kidney disease with stage 1 through stage 4 chronic kidney disease, or unspecified chronic kidney disease: Secondary | ICD-10-CM | POA: Diagnosis not present

## 2014-10-22 DIAGNOSIS — G309 Alzheimer's disease, unspecified: Secondary | ICD-10-CM | POA: Diagnosis not present

## 2014-10-22 DIAGNOSIS — G8911 Acute pain due to trauma: Secondary | ICD-10-CM | POA: Diagnosis not present

## 2014-10-23 DIAGNOSIS — F0281 Dementia in other diseases classified elsewhere with behavioral disturbance: Secondary | ICD-10-CM | POA: Diagnosis not present

## 2014-10-23 DIAGNOSIS — I129 Hypertensive chronic kidney disease with stage 1 through stage 4 chronic kidney disease, or unspecified chronic kidney disease: Secondary | ICD-10-CM | POA: Diagnosis not present

## 2014-10-23 DIAGNOSIS — M545 Low back pain: Secondary | ICD-10-CM | POA: Diagnosis not present

## 2014-10-23 DIAGNOSIS — M159 Polyosteoarthritis, unspecified: Secondary | ICD-10-CM | POA: Diagnosis not present

## 2014-10-23 DIAGNOSIS — G8911 Acute pain due to trauma: Secondary | ICD-10-CM | POA: Diagnosis not present

## 2014-10-23 DIAGNOSIS — G309 Alzheimer's disease, unspecified: Secondary | ICD-10-CM | POA: Diagnosis not present

## 2014-10-27 DIAGNOSIS — I129 Hypertensive chronic kidney disease with stage 1 through stage 4 chronic kidney disease, or unspecified chronic kidney disease: Secondary | ICD-10-CM | POA: Diagnosis not present

## 2014-10-27 DIAGNOSIS — G8911 Acute pain due to trauma: Secondary | ICD-10-CM | POA: Diagnosis not present

## 2014-10-27 DIAGNOSIS — F0281 Dementia in other diseases classified elsewhere with behavioral disturbance: Secondary | ICD-10-CM | POA: Diagnosis not present

## 2014-10-27 DIAGNOSIS — M159 Polyosteoarthritis, unspecified: Secondary | ICD-10-CM | POA: Diagnosis not present

## 2014-10-27 DIAGNOSIS — G309 Alzheimer's disease, unspecified: Secondary | ICD-10-CM | POA: Diagnosis not present

## 2014-10-27 DIAGNOSIS — M545 Low back pain: Secondary | ICD-10-CM | POA: Diagnosis not present

## 2014-11-01 DIAGNOSIS — M545 Low back pain: Secondary | ICD-10-CM | POA: Diagnosis not present

## 2014-11-01 DIAGNOSIS — M159 Polyosteoarthritis, unspecified: Secondary | ICD-10-CM | POA: Diagnosis not present

## 2014-11-01 DIAGNOSIS — I129 Hypertensive chronic kidney disease with stage 1 through stage 4 chronic kidney disease, or unspecified chronic kidney disease: Secondary | ICD-10-CM | POA: Diagnosis not present

## 2014-11-01 DIAGNOSIS — G309 Alzheimer's disease, unspecified: Secondary | ICD-10-CM | POA: Diagnosis not present

## 2014-11-01 DIAGNOSIS — G8911 Acute pain due to trauma: Secondary | ICD-10-CM | POA: Diagnosis not present

## 2014-11-01 DIAGNOSIS — F0281 Dementia in other diseases classified elsewhere with behavioral disturbance: Secondary | ICD-10-CM | POA: Diagnosis not present

## 2014-11-03 DIAGNOSIS — M1990 Unspecified osteoarthritis, unspecified site: Secondary | ICD-10-CM | POA: Diagnosis not present

## 2014-11-03 DIAGNOSIS — M79674 Pain in right toe(s): Secondary | ICD-10-CM | POA: Diagnosis not present

## 2014-11-03 DIAGNOSIS — B351 Tinea unguium: Secondary | ICD-10-CM | POA: Diagnosis not present

## 2014-11-03 DIAGNOSIS — M79675 Pain in left toe(s): Secondary | ICD-10-CM | POA: Diagnosis not present

## 2014-11-03 DIAGNOSIS — R262 Difficulty in walking, not elsewhere classified: Secondary | ICD-10-CM | POA: Diagnosis not present

## 2014-11-04 IMAGING — CR DG TIBIA/FIBULA 2V*L*
4 series · 4 of 4 positions shown · non-contrast
Comparison: None.

CLINICAL DATA: Status post fall last evening with acute right lower
leg pain; left leg with history of previous injury

EXAM:
LEFT TIBIA AND FIBULA - 2 VIEW

[x tib-fib ap left (1 of 2)]
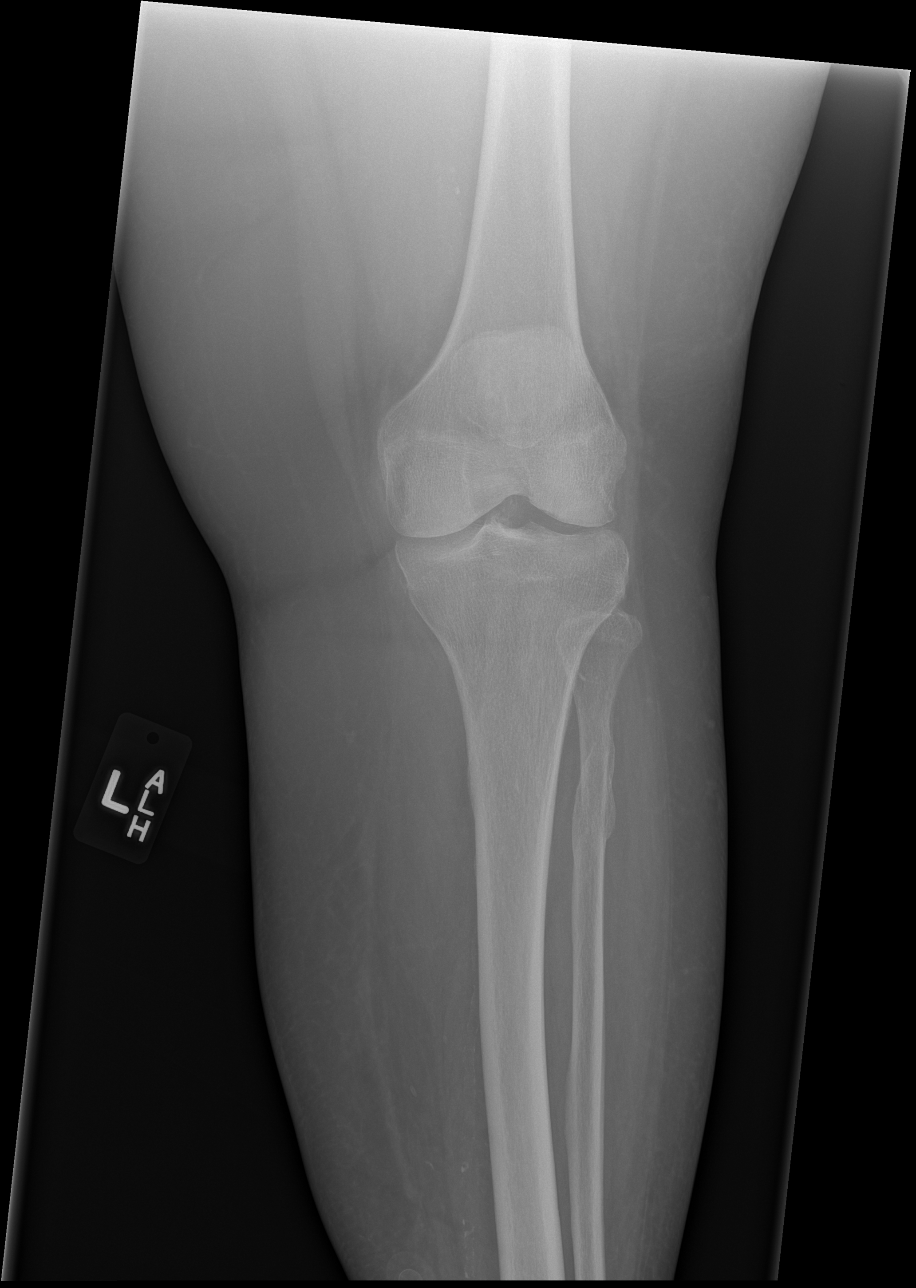

[x tib-fib ap left (2 of 2)]
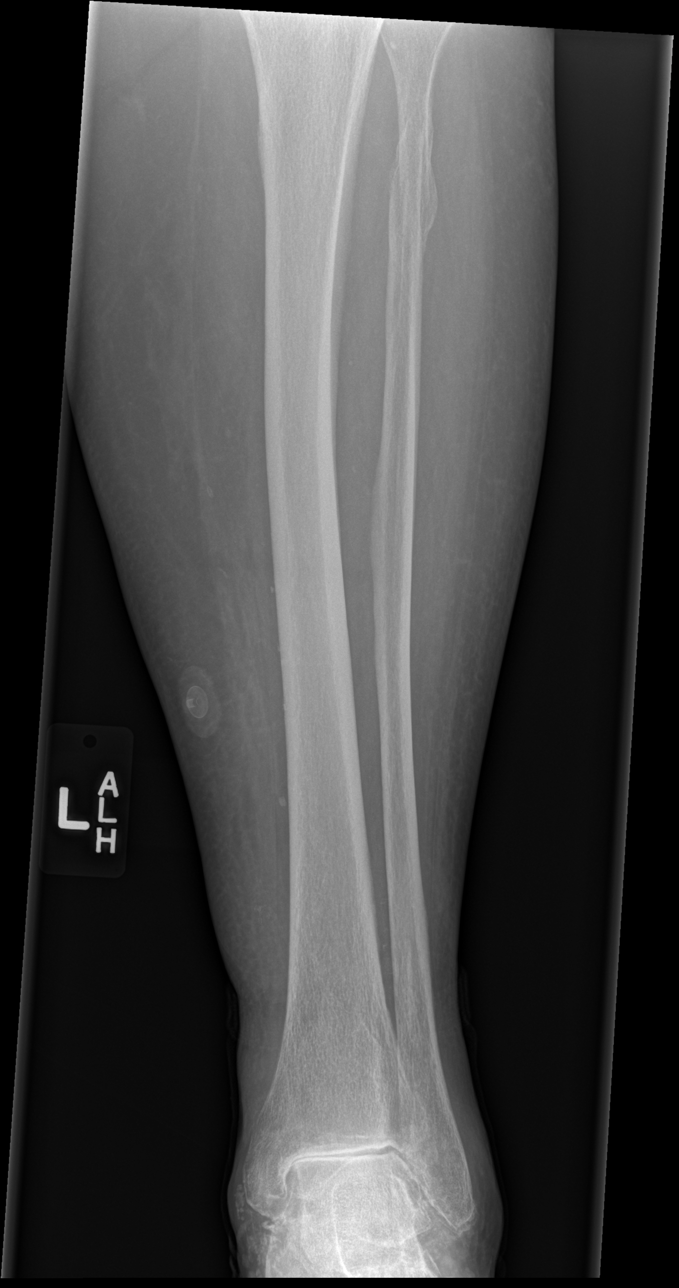

[x tib-fib lat left (1 of 2)]
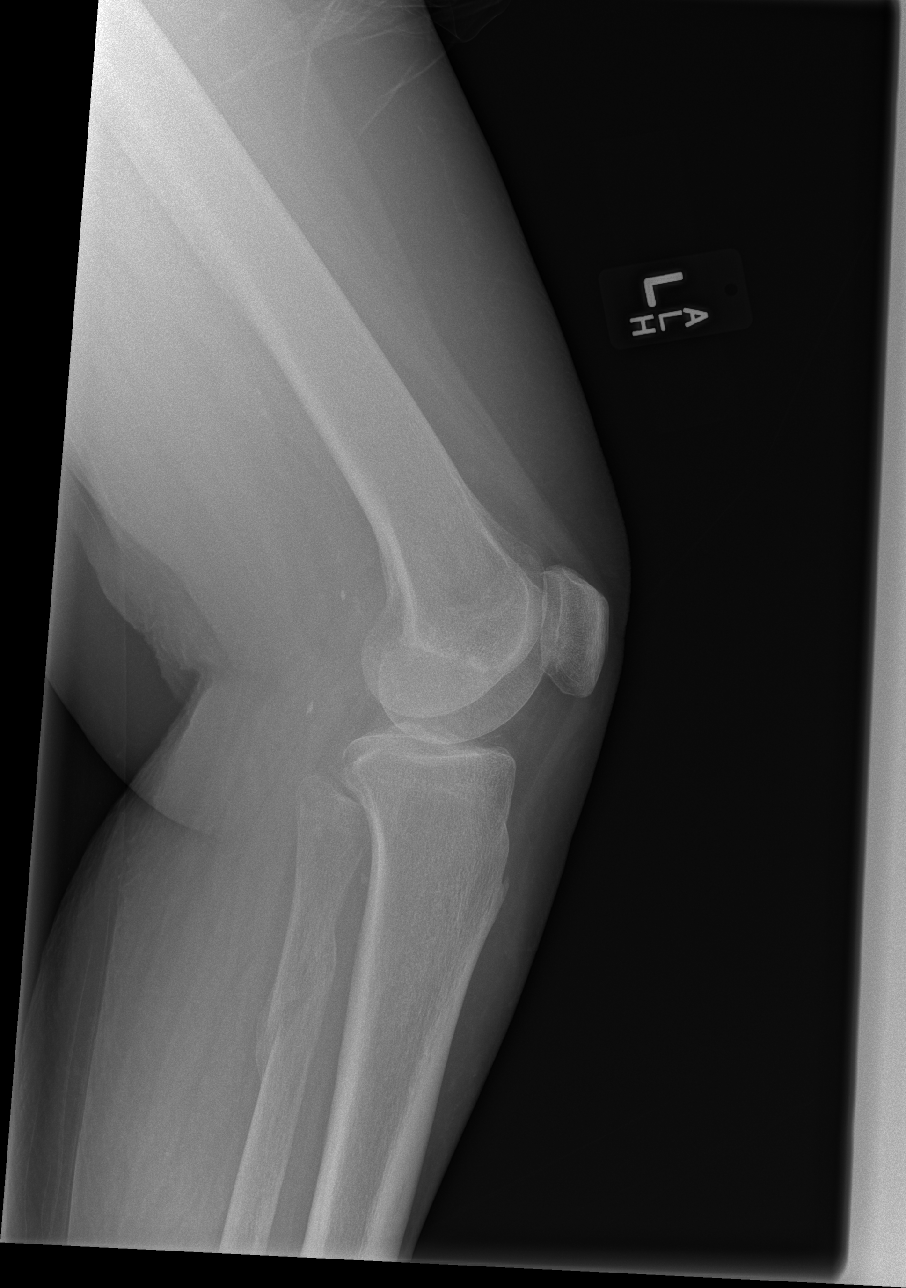

[x tib-fib lat left (2 of 2)]
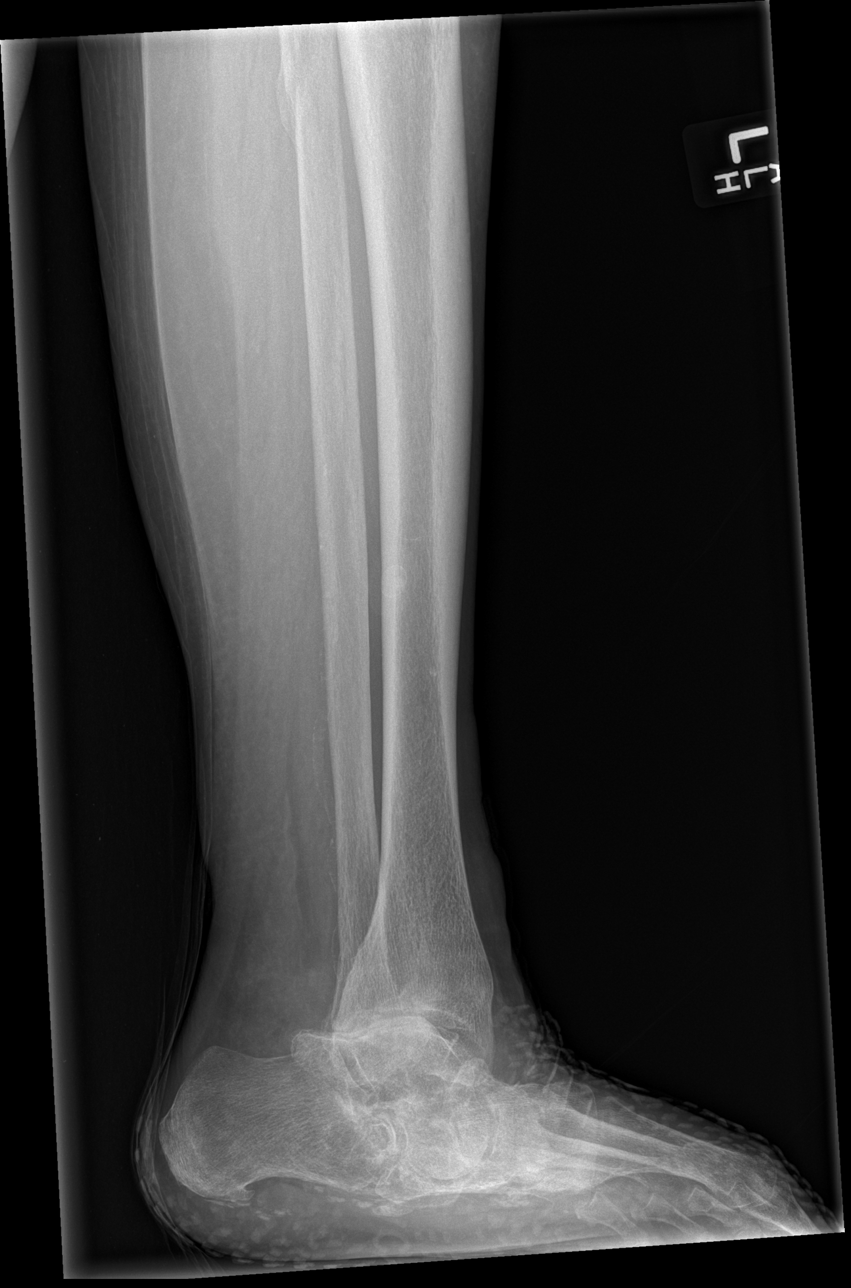

[4 of 4 positions shown; findings below may reference images not displayed]

FINDINGS: There is old deformity of the a proximal third of the diaphysis of
the fibula consistent with previous fracture which has healed with
deformity. The adjacent tibia is intact. There is mild degenerative
change of the knee manifested by beaking of the tibial spines. There
is moderate degenerative change of the tibiotalar articulation. No
acute fracture of the ankle is demonstrated. There is a pes planus
contour of the foot where visualized. The soft tissues of the lower
leg exhibit numerous phleboliths.
IMPRESSION: There is no acute fracture of the left tibia or fibula. Attic prior
fracture has healed with deformity involving the proximal third of
the shaft of the left fibula. There is moderate degenerative change
of the left ankle.

## 2014-12-17 ENCOUNTER — Encounter: Payer: Self-pay | Admitting: Family Medicine

## 2014-12-17 ENCOUNTER — Ambulatory Visit (INDEPENDENT_AMBULATORY_CARE_PROVIDER_SITE_OTHER): Payer: Medicare Other | Admitting: Family Medicine

## 2014-12-17 VITALS — BP 152/78 | HR 68 | Temp 98.0°F | Ht 61.0 in | Wt 210.4 lb

## 2014-12-17 DIAGNOSIS — E860 Dehydration: Secondary | ICD-10-CM

## 2014-12-17 DIAGNOSIS — R350 Frequency of micturition: Secondary | ICD-10-CM

## 2014-12-17 DIAGNOSIS — R4182 Altered mental status, unspecified: Secondary | ICD-10-CM | POA: Diagnosis not present

## 2014-12-17 DIAGNOSIS — Z87448 Personal history of other diseases of urinary system: Secondary | ICD-10-CM | POA: Diagnosis not present

## 2014-12-17 LAB — POCT UA - MICROSCOPIC ONLY
CASTS, UR, LPF, POC: 0
CRYSTALS, UR, HPF, POC: 0
RBC, urine, microscopic: 0
WBC, UR, HPF, POC: 0
YEAST UA: 0

## 2014-12-17 LAB — POCT URINALYSIS DIPSTICK
Bilirubin, UA: NEGATIVE
GLUCOSE UA: NEGATIVE
Ketones, UA: NEGATIVE
Leukocytes, UA: NEGATIVE
NITRITE UA: NEGATIVE
Protein, UA: NEGATIVE
RBC UA: NEGATIVE
Spec Grav, UA: >=1.03
UROBILINOGEN UA: 0.2
pH, UA: 6

## 2014-12-17 NOTE — Assessment & Plan Note (Signed)
ua clear on dip and spin today  Disc need for inc water in light of high SG See assessment for dehydration  Will continue to follow

## 2014-12-17 NOTE — Assessment & Plan Note (Signed)
Suspect mild - given high urine SG Needs better water intake for renal health  Disc this in length  inst staff at her nursing facility to incl water with meals and supervise intake  Also enc in between meals

## 2014-12-17 NOTE — Progress Notes (Signed)
Subjective:    Patient ID: Margaret Hicks, female    DOB: 30-Nov-1931, 81 y.o.   MRN: [id number]  HPI Here for some urinary issues   ? Discomfort in low abdomen - not really complaining  Repeated touching of low abd  ? If freqency in urination  She is trying to urinate in odd areas at odd times (ie : trashcans)  Is also incontinent   Needs order to check /assistance every 2 hours while awake for assistance (needs assistance with briefs and toileting)  Staff fills cup- but she does not drink it  Needs order for liquids with meals (water) - needs to be prompted to drink    Some strong odor to urine  No blood   Results for orders placed or performed in visit on 12/17/14  POCT urinalysis dipstick  Result Value Ref Range   Color, UA yellow    Clarity, UA clear    Glucose, UA neg    Bilirubin, UA neg    Ketones, UA neg    Spec Grav, UA >=1.030    Blood, UA neg    pH, UA 6.0    Protein, UA neg    Urobilinogen, UA 0.2    Nitrite, UA neg    Leukocytes, UA Negative     Per daughter - urine looked dark  Micro urine eval is clear   Patient Active Problem List   Diagnosis Date Noted  . Dehydration 12/17/2014  . Mental status change 12/17/2014  . Mobility impaired 09/17/2014  . Encounter for Medicare annual wellness exam 09/01/2014  . Internal hemorrhoids 08/27/2014  . Callus of foot 08/12/2013  . Pedal edema 08/12/2013  . Orthostatic hypotension 05/29/2013  . Dysphagia, unspecified(787.20) 05/29/2013  . Aneurysm, splenic artery 05/29/2013  . Senile dementia, uncomplicated 01/09/2013  . Back pain, thoracic 11/28/2012  . Depression 11/28/2012  . Other screening mammogram 11/28/2012  . EXTERNAL HEMORRHOIDS 01/26/2011  . GERD 09/01/2010  . Hypothyroidism 05/27/2010  . Hyperlipidemia LDL goal <100 05/27/2010  . Essential hypertension 05/27/2010  . Disorder resulting from impaired renal function 05/27/2010  . Unspecified urinary incontinence 05/27/2010  . Personal history  of urinary disorder 05/27/2010   Past Medical History  Diagnosis Date  . Hypothyroidism   . Urinary incontinence   . Hypertension   . Arthritis   . Esophageal reflux   . Obesity   . Renal insufficiency   . OP (osteoporosis)   . TIA (transient ischemic attack)   . Upper GI bleed   . Hyperlipemia   . Hemorrhoids   . Acute gastritis   . Schatzki's ring   . Back pain   . GERD (gastroesophageal reflux disease)   . Dementia    Past Surgical History  Procedure Laterality Date  . Cardiac catheterization    . Cholecystectomy    . Tonsillectomy    . Abdominal hysterectomy     History  Substance Use Topics  . Smoking status: Never Smoker   . Smokeless tobacco: Never Used  . Alcohol Use: No   Family History  Problem Relation Age of Onset  . Dementia Mother   . Stroke Mother   . Arthritis Sister   . Dementia Brother   . Cancer      nephew  . Colon cancer Neg Hx   . Heart disease Father   . Dementia Brother    Allergies  Allergen Reactions  . Simvastatin     REACTION: muscle pain/ memory issues   Current Outpatient  Prescriptions on File Prior to Visit  Medication Sig Dispense Refill  . amLODipine-valsartan (EXFORGE) 5-320 MG per tablet Take 1 tablet by mouth daily. 90 tablet 0  . cholecalciferol (VITAMIN D) 1000 UNITS tablet Take 1,000 Units by mouth every evening.     Marland Kitchen dexlansoprazole (DEXILANT) 60 MG capsule Take 1 capsule (60 mg total) by mouth daily. 60 capsule 11  . docusate sodium (COLACE) 50 MG capsule Take by mouth daily.    . Flaxseed, Linseed, (FLAX SEED OIL) 1000 MG CAPS Take 1 capsule by mouth 2 (two) times daily.      Marland Kitchen levothyroxine (SYNTHROID, LEVOTHROID) 100 MCG tablet Take 100 mcg by mouth daily before breakfast.    . metoprolol (LOPRESSOR) 100 MG tablet Take 100 mg by mouth 2 (two) times daily.    . Multiple Vitamin (MULTIVITAMIN) capsule Take 1 capsule by mouth daily.      . Probiotic Product (PROBIOTIC FORMULA PO) Take by mouth daily.     .  sertraline (ZOLOFT) 50 MG tablet Take 50 mg by mouth daily.    Marland Kitchen spironolactone (ALDACTONE) 25 MG tablet Take 12.5 mg by mouth daily.     No current facility-administered medications on file prior to visit.       Review of Systems Review of Systems  Constitutional: Negative for fever, appetite change, fatigue and unexpected weight change.  Eyes: Negative for pain and visual disturbance.  Respiratory: Negative for cough and shortness of breath.   Cardiovascular: Negative for cp or palpitations    Gastrointestinal: Negative for nausea, diarrhea and constipation.  Genitourinary: Negative for urgency and frequency.  Skin: Negative for pallor or rash   Neurological: Negative for weakness, light-headedness, numbness and headaches.  Hematological: Negative for adenopathy. Does not bruise/bleed easily.  Psychiatric/Behavioral: Negative for dysphoric mood. The patient is occ anxious, pos for moderate dementia with poor short term memory         Objective:   Physical Exam  Constitutional: She appears well-developed and well-nourished. No distress.  obese and well appearing   HENT:  Head: Normocephalic and atraumatic.  Mouth/Throat: Oropharynx is clear and moist.  Eyes: Conjunctivae and EOM are normal. Pupils are equal, round, and reactive to light. No scleral icterus.  Neck: Normal range of motion. Neck supple. No JVD present. Carotid bruit is not present.  Cardiovascular: Normal rate and regular rhythm.   Pulmonary/Chest: Effort normal and breath sounds normal. No respiratory distress. She has no wheezes. She has no rales.  Abdominal: Soft. Bowel sounds are normal. She exhibits no distension and no mass. There is no tenderness.  No cva tenderness   Musculoskeletal: She exhibits no edema.  Lymphadenopathy:    She has no cervical adenopathy.  Neurological: She is alert. She has normal reflexes. No cranial nerve deficit. She exhibits normal muscle tone. Coordination normal.  Skin: Skin is  warm and dry. No rash noted. No erythema. No pallor.  Psychiatric: She has a normal mood and affect.  Cheerful  Babbles at times/ baseline dementia           Assessment & Plan:   Problem List Items Addressed This Visit      Other   Dehydration    Suspect mild - given high urine SG Needs better water intake for renal health  Disc this in length  inst staff at her nursing facility to incl water with meals and supervise intake  Also enc in between meals       Mental status change  ua clear and pt is not ill appearing  In good spirits Suspect progression of dementia  Disc need for frequent checks/toileting and better hydration Will continue to follow       Personal history of urinary disorder    ua clear on dip and spin today  Disc need for inc water in light of high SG See assessment for dehydration  Will continue to follow       Relevant Orders   POCT UA - Microscopic Only (Completed)    Other Visit Diagnoses    Urinary frequency    -  Primary    Relevant Orders    POCT urinalysis dipstick (Completed)

## 2014-12-17 NOTE — Assessment & Plan Note (Signed)
ua clear and pt is not ill appearing  In good spirits Suspect progression of dementia  Disc need for frequent checks/toileting and better hydration Will continue to follow

## 2014-12-17 NOTE — Patient Instructions (Signed)
Urine is concentrated today  I suspect mild dehydration-that is bad for kidney health  Please encourage water intake with and between meals  Also would appreciate help with toileting every 2 hours while awake (checking /prompting is fine)  Let me know if changes or further problems

## 2015-01-20 ENCOUNTER — Telehealth: Payer: Self-pay | Admitting: Family Medicine

## 2015-01-20 NOTE — Telephone Encounter (Signed)
Called mom and inform her that the letter is ready for pick up. She stated that she will come back tomorrow to pick up. Left in front office.

## 2015-01-20 NOTE — Telephone Encounter (Signed)
Pt's daughter in law, Lawson Radaraula Joyce called and says she needs a letter of medical necessity for transport from her home to the hospital due to a fall in October, 2015 b/c whoever was on the ambulance filled out the form and said "leg pain" so insurance has denied.  Can you give this letter or does pt need to contact Orthopedic? Pt request c/b. Thank you.

## 2015-01-20 NOTE — Telephone Encounter (Signed)
Note is in IN box

## 2015-02-13 DIAGNOSIS — R262 Difficulty in walking, not elsewhere classified: Secondary | ICD-10-CM | POA: Diagnosis not present

## 2015-02-13 DIAGNOSIS — M79674 Pain in right toe(s): Secondary | ICD-10-CM | POA: Diagnosis not present

## 2015-02-13 DIAGNOSIS — M79675 Pain in left toe(s): Secondary | ICD-10-CM | POA: Diagnosis not present

## 2015-02-13 DIAGNOSIS — B351 Tinea unguium: Secondary | ICD-10-CM | POA: Diagnosis not present

## 2015-03-06 ENCOUNTER — Ambulatory Visit (INDEPENDENT_AMBULATORY_CARE_PROVIDER_SITE_OTHER): Payer: Medicare Other | Admitting: Podiatry

## 2015-03-06 ENCOUNTER — Ambulatory Visit: Payer: Self-pay | Admitting: Podiatry

## 2015-03-06 VITALS — BP 137/73 | HR 61 | Resp 16

## 2015-03-06 DIAGNOSIS — M779 Enthesopathy, unspecified: Secondary | ICD-10-CM | POA: Diagnosis not present

## 2015-03-06 DIAGNOSIS — B351 Tinea unguium: Secondary | ICD-10-CM

## 2015-03-09 NOTE — Progress Notes (Signed)
Subjective:     Patient ID: Margaret DaysElva S Hicks, female   DOB: [birth year], 81 y.o.   MRN: [id number]  HPI patient presents with caregiver stating we were just concerned about the condition of her feet and nails and that she had some discoloration and that there was no issues with   Review of Systems     Objective:   Physical Exam Neurovascular status intact with muscle strength adequate range of motion of the subtalar midtarsal joint diminished at this time. Digits are found to be well perfused and there is diminished hair growth of her scan but there was no indications of infection as far as redness or drainage occur    Assessment:     Localized problem with no indications of systemic disease and no indications for localized infection    Plan:     Advised on soaks and trimming and we can trim them in the future if needed. Patient will monitor as will caregiver and if any issues are to occur they're to let us know

## 2015-03-30 DIAGNOSIS — N183 Chronic kidney disease, stage 3 (moderate): Secondary | ICD-10-CM | POA: Diagnosis not present

## 2015-03-30 DIAGNOSIS — N2581 Secondary hyperparathyroidism of renal origin: Secondary | ICD-10-CM | POA: Diagnosis not present

## 2015-03-30 DIAGNOSIS — I10 Essential (primary) hypertension: Secondary | ICD-10-CM | POA: Diagnosis not present

## 2015-06-01 DIAGNOSIS — B351 Tinea unguium: Secondary | ICD-10-CM | POA: Diagnosis not present

## 2015-06-01 DIAGNOSIS — M79675 Pain in left toe(s): Secondary | ICD-10-CM | POA: Diagnosis not present

## 2015-06-01 DIAGNOSIS — M79674 Pain in right toe(s): Secondary | ICD-10-CM | POA: Diagnosis not present

## 2015-06-01 DIAGNOSIS — R262 Difficulty in walking, not elsewhere classified: Secondary | ICD-10-CM | POA: Diagnosis not present

## 2015-06-05 ENCOUNTER — Other Ambulatory Visit: Payer: Self-pay | Admitting: Family Medicine

## 2015-07-04 ENCOUNTER — Other Ambulatory Visit: Payer: Self-pay | Admitting: Gastroenterology

## 2015-07-31 DIAGNOSIS — I10 Essential (primary) hypertension: Secondary | ICD-10-CM | POA: Diagnosis not present

## 2015-07-31 DIAGNOSIS — N183 Chronic kidney disease, stage 3 (moderate): Secondary | ICD-10-CM | POA: Diagnosis not present

## 2015-07-31 DIAGNOSIS — N2581 Secondary hyperparathyroidism of renal origin: Secondary | ICD-10-CM | POA: Diagnosis not present

## 2015-08-06 DIAGNOSIS — Z0279 Encounter for issue of other medical certificate: Secondary | ICD-10-CM

## 2015-08-11 ENCOUNTER — Other Ambulatory Visit: Payer: Self-pay | Admitting: Family Medicine

## 2015-08-24 DIAGNOSIS — M79671 Pain in right foot: Secondary | ICD-10-CM | POA: Diagnosis not present

## 2015-08-24 DIAGNOSIS — M79672 Pain in left foot: Secondary | ICD-10-CM | POA: Diagnosis not present

## 2015-08-24 DIAGNOSIS — B351 Tinea unguium: Secondary | ICD-10-CM | POA: Diagnosis not present

## 2015-08-31 ENCOUNTER — Other Ambulatory Visit: Payer: Self-pay | Admitting: Family Medicine

## 2015-08-31 NOTE — Telephone Encounter (Signed)
done

## 2015-08-31 NOTE — Telephone Encounter (Signed)
Please refill for 90 days

## 2015-08-31 NOTE — Telephone Encounter (Signed)
Last refilled on 09/01/14 #90 with 3 additional refills, pt has CPE scheduled on 09/04/15, please advise

## 2015-09-04 ENCOUNTER — Encounter: Payer: Self-pay | Admitting: Family Medicine

## 2015-09-04 ENCOUNTER — Ambulatory Visit (INDEPENDENT_AMBULATORY_CARE_PROVIDER_SITE_OTHER): Payer: Medicare Other | Admitting: Family Medicine

## 2015-09-04 ENCOUNTER — Telehealth: Payer: Self-pay | Admitting: *Deleted

## 2015-09-04 VITALS — BP 124/68 | HR 55 | Temp 97.8°F | Wt 222.5 lb

## 2015-09-04 DIAGNOSIS — E039 Hypothyroidism, unspecified: Secondary | ICD-10-CM

## 2015-09-04 DIAGNOSIS — F0390 Unspecified dementia without behavioral disturbance: Secondary | ICD-10-CM

## 2015-09-04 DIAGNOSIS — Z0000 Encounter for general adult medical examination without abnormal findings: Secondary | ICD-10-CM

## 2015-09-04 DIAGNOSIS — I10 Essential (primary) hypertension: Secondary | ICD-10-CM | POA: Diagnosis not present

## 2015-09-04 DIAGNOSIS — R32 Unspecified urinary incontinence: Secondary | ICD-10-CM | POA: Diagnosis not present

## 2015-09-04 DIAGNOSIS — E785 Hyperlipidemia, unspecified: Secondary | ICD-10-CM

## 2015-09-04 DIAGNOSIS — Z23 Encounter for immunization: Secondary | ICD-10-CM | POA: Diagnosis not present

## 2015-09-04 LAB — LIPID PANEL
Cholesterol: 222 mg/dL — ABNORMAL HIGH (ref 0–200)
HDL: 51.3 mg/dL (ref >39.00)
NonHDL: 170.61
Total CHOL/HDL Ratio: 4
Triglycerides: 235 mg/dL — ABNORMAL HIGH (ref 0.0–149.0)
VLDL: 47 mg/dL — ABNORMAL HIGH (ref 0.0–40.0)

## 2015-09-04 LAB — CBC WITH DIFFERENTIAL/PLATELET
Basophils Absolute: 0 10*3/uL (ref 0.0–0.1)
Basophils Relative: 0.5 % (ref 0.0–3.0)
EOS ABS: 0.1 10*3/uL (ref 0.0–0.7)
EOS PCT: 1 % (ref 0.0–5.0)
HEMATOCRIT: 40.8 % (ref 36.0–46.0)
Hemoglobin: 13.5 g/dL (ref 12.0–15.0)
Lymphocytes Relative: 25.4 % (ref 12.0–46.0)
Lymphs Abs: 1.6 10*3/uL (ref 0.7–4.0)
MCHC: 33.1 g/dL (ref 30.0–36.0)
MCV: 84.2 fl (ref 78.0–100.0)
MONO ABS: 0.5 10*3/uL (ref 0.1–1.0)
Monocytes Relative: 7.9 % (ref 3.0–12.0)
Neutro Abs: 4.1 10*3/uL (ref 1.4–7.7)
Neutrophils Relative %: 65.2 % (ref 43.0–77.0)
Platelets: 215 10*3/uL (ref 150.0–400.0)
RBC: 4.84 Mil/uL (ref 3.87–5.11)
RDW: 14.1 % (ref 11.5–15.5)
WBC: 6.4 10*3/uL (ref 4.0–10.5)

## 2015-09-04 LAB — COMPREHENSIVE METABOLIC PANEL
ALBUMIN: 4 g/dL (ref 3.5–5.2)
ALT: 13 U/L (ref 0–35)
AST: 18 U/L (ref 0–37)
Alkaline Phosphatase: 90 U/L (ref 39–117)
BILIRUBIN TOTAL: 0.5 mg/dL (ref 0.2–1.2)
BUN: 26 mg/dL — AB (ref 6–23)
CALCIUM: 9.8 mg/dL (ref 8.4–10.5)
CO2: 28 mEq/L (ref 19–32)
Chloride: 103 mEq/L (ref 96–112)
Creatinine, Ser: 1.22 mg/dL — ABNORMAL HIGH (ref 0.40–1.20)
GFR: 44.9 mL/min — ABNORMAL LOW (ref >60.00)
Glucose, Bld: 93 mg/dL (ref 70–99)
Potassium: 4.7 mEq/L (ref 3.5–5.1)
SODIUM: 136 meq/L (ref 135–145)
TOTAL PROTEIN: 6.7 g/dL (ref 6.0–8.3)

## 2015-09-04 LAB — LDL CHOLESTEROL, DIRECT: LDL DIRECT: 133 mg/dL

## 2015-09-04 LAB — TSH: TSH: 2.77 u[IU]/mL (ref 0.35–4.50)

## 2015-09-04 MED ORDER — LEVOTHYROXINE SODIUM 100 MCG PO TABS: 100.0000 ug | ORAL_TABLET | Freq: Every day | ORAL | Status: DC

## 2015-09-04 MED ORDER — AMLODIPINE BESYLATE-VALSARTAN 5-320 MG PO TABS: 1.0000 | ORAL_TABLET | Freq: Every day | ORAL | Status: DC

## 2015-09-04 MED ORDER — SERTRALINE HCL 50 MG PO TABS: ORAL_TABLET | ORAL | Status: DC

## 2015-09-04 MED ORDER — SPIRONOLACTONE 25 MG PO TABS: 12.5000 mg | ORAL_TABLET | Freq: Every day | ORAL | Status: DC

## 2015-09-04 MED ORDER — METOPROLOL TARTRATE 100 MG PO TABS: 100.0000 mg | ORAL_TABLET | Freq: Two times a day (BID) | ORAL | Status: DC

## 2015-09-04 NOTE — Progress Notes (Signed)
Pre visit review using our clinic review tool, if applicable. No additional management support is needed unless otherwise documented below in the visit note. 

## 2015-09-04 NOTE — Patient Instructions (Addendum)
See the info I gave you about a tetanus shot  Flu shot today prevnar shot today  If you are interested in a shingles/zoster vaccine - call your insurance to check on coverage,( you should not get it within 1 month of other vaccines) , then call us for a prescription  for it to take to a pharmacy that gives the shot , or make a nurse visit to get it here depending on your coverage  labs today  Stop the colace for a while  Stop flax seed Stop probiotic  Keep up the better water intake   If the dexilant savings card does not work out we will have to change to another medicine of the same class

## 2015-09-04 NOTE — Telephone Encounter (Signed)
Phone call from pharmacist at [facility] in [place].  They are filling levothyroxine 100 mcg that has been previously filled at another pharmacy.  The manufacturer may be different.  Per pharmacist, they are required to report this to the physician.  No further action is needed unless changes are to be made to the original prescription.

## 2015-09-04 NOTE — Progress Notes (Signed)
Subjective:    Patient ID: Margaret Hicks, female    DOB: 19-May-1932, 81 y.o.   MRN: [id number]  HPI Here for annual medicare wellness visit as well as chronic/acute medical problems   I have personally reviewed the Medicare Annual Wellness questionnaire and have noted 1. The patient's medical and social history 2. Their use of alcohol, tobacco or illicit drugs 3. Their current medications and supplements 4. The patient's functional ability including ADL's, fall risks, home safety risks and hearing or visual             impairment. 5. Diet and physical activities 6. Evidence for depression or mood disorders  The patients weight, height, BMI have been recorded in the chart and visual acuity is per eye clinic.  I have made referrals, counseling and provided education to the patient based review of the above and I have provided the pt with a written personalized care plan for preventive services. Reviewed and updated provider list, see scanned forms.  See scanned forms.  Routine anticipatory guidance given to patient.  See health maintenance. Colon cancer screening -not interested in screening for colon cancer  Breast cancer screening-not interested in screening  Self breast exam -no lumps  Flu vaccine-will get today Tetanus vaccine - $  Pneumovax- had 23/ now needs the prevnar 13  Zoster vaccine - does not want today-will consider if covered  Has not had a dexa/ not fractures for 5 years  (not fragility)  - taking ca and D every day  Advance directive- has a living will and power of attourney Cognitive function addressed- see scanned forms- and if abnormal then additional documentation follows. - a slow steady progression of dementia / she tends to be content and not agitated - participates in everything and very social    PMH and SH reviewed  Meds, vitals, and allergies reviewed.   ROS: See HPI.  Otherwise negative.     Wt is up 12 lb (very good appetite)  bmi is 42   Obese  Renal insuff-sees Dr Cherylann Ratel Improved - very happy about that  Making effort to drink more water - working hard on that   bp is stable today  No cp or palpitations or headaches or edema  No side effects to medicines  BP Readings from Last 3 Encounters:  09/04/15 124/68  03/06/15 137/73  12/17/14 152/78     Hypothyroidism  Pt has no clinical changes No change in energy level/ hair or skin/ edema and no tremor Lab Results  Component Value Date   TSH 2.12 09/01/2014      Hyperlipidemia Lab Results  Component Value Date   CHOL 220* 09/01/2014   CHOL 197 04/10/2013   CHOL 233* 11/23/2010   Lab Results  Component Value Date   HDL 46.00 09/01/2014   HDL 40.70 04/10/2013   HDL 42.80 11/23/2010   Lab Results  Component Value Date   LDLCALC 137* 09/01/2014   LDLCALC 129* 04/10/2013   Lab Results  Component Value Date   TRIG 186.0* 09/01/2014   TRIG 139.0 04/10/2013   TRIG 204.0* 11/23/2010   Lab Results  Component Value Date   CHOLHDL 5 09/01/2014   CHOLHDL 5 04/10/2013   CHOLHDL 5 11/23/2010   Lab Results  Component Value Date   LDLDIRECT 161.0 11/23/2010   LDLDIRECT 150.9 05/27/2010     Due for her labs today   Urinary/stool incontinence - is on colace - ? Perhaps cut back Is drinking more water  Does not want to see urology and does not want meds     Lost her px drug plan  Will get on part D   Patient Active Problem List   Diagnosis Date Noted  . Dehydration 12/17/2014  . Mental status change 12/17/2014  . Mobility impaired 09/17/2014  . Encounter for Medicare annual wellness exam 09/01/2014  . Internal hemorrhoids 08/27/2014  . Callus of foot 08/12/2013  . Pedal edema 08/12/2013  . Orthostatic hypotension 05/29/2013  . Dysphagia, unspecified(787.20) 05/29/2013  . Aneurysm, splenic artery (HCC) 05/29/2013  . Senile dementia, uncomplicated 01/09/2013  . Back pain, thoracic 11/28/2012  . Depression 11/28/2012  . Other screening  mammogram 11/28/2012  . EXTERNAL HEMORRHOIDS 01/26/2011  . GERD 09/01/2010  . Hypothyroidism 05/27/2010  . Hyperlipidemia LDL goal <100 05/27/2010  . Essential hypertension 05/27/2010  . Disorder resulting from impaired renal function 05/27/2010  . Unspecified urinary incontinence 05/27/2010  . Personal history of urinary disorder 05/27/2010   Past Medical History  Diagnosis Date  . Hypothyroidism   . Urinary incontinence   . Hypertension   . Arthritis   . Esophageal reflux   . Obesity   . Renal insufficiency   . OP (osteoporosis)   . TIA (transient ischemic attack)   . Upper GI bleed   . Hyperlipemia   . Hemorrhoids   . Acute gastritis   . Schatzki's ring   . Back pain   . GERD (gastroesophageal reflux disease)   . Dementia    Past Surgical History  Procedure Laterality Date  . Cardiac catheterization    . Cholecystectomy    . Tonsillectomy    . Abdominal hysterectomy     Social History  Substance Use Topics  . Smoking status: Never Smoker   . Smokeless tobacco: Never Used  . Alcohol Use: No   Family History  Problem Relation Age of Onset  . Dementia Mother   . Stroke Mother   . Arthritis Sister   . Dementia Brother   . Cancer      nephew  . Colon cancer Neg Hx   . Heart disease Father   . Dementia Brother    Allergies  Allergen Reactions  . Simvastatin     REACTION: muscle pain/ memory issues   Current Outpatient Prescriptions on File Prior to Visit  Medication Sig Dispense Refill  . cholecalciferol (VITAMIN D) 1000 UNITS tablet Take 1,000 Units by mouth every evening.     Marland Kitchen dexlansoprazole (DEXILANT) 60 MG capsule Take 1 capsule (60 mg total) by mouth daily. 60 capsule 11  . Multiple Vitamin (MULTIVITAMIN) capsule Take 1 capsule by mouth daily.       No current facility-administered medications on file prior to visit.    Review of Systems Review of Systems  Constitutional: Negative for fever, appetite change, fatigue and unexpected weight  change.  Eyes: Negative for pain and visual disturbance.  Respiratory: Negative for cough and shortness of breath.   Cardiovascular: Negative for cp or palpitations   pedal edema/lymphedema is stable  Gastrointestinal: Negative for nausea, diarrhea and constipation.  Genitourinary: Negative for urgency and frequency.  Skin: Negative for pallor or rash   Neurological: Negative for weakness, light-headedness, numbness and headaches.  Hematological: Negative for adenopathy. Does not bruise/bleed easily.  Psychiatric/Behavioral: Negative for dysphoric mood. The patient is not nervous/anxious.  pos for dementia with some confusion        Objective:   Physical Exam  Constitutional: She appears well-developed and well-nourished. No distress.  obese and well appearing  Elderly female with demential   HENT:  Head: Normocephalic and atraumatic.  Right Ear: External ear normal.  Left Ear: External ear normal.  Mouth/Throat: Oropharynx is clear and moist.  Eyes: Conjunctivae and EOM are normal. Pupils are equal, round, and reactive to light. No scleral icterus.  Neck: Normal range of motion. Neck supple. No JVD present. Carotid bruit is not present. No thyromegaly present.  Cardiovascular: Normal rate, regular rhythm, normal heart sounds and intact distal pulses.  Exam reveals no gallop.   Pulmonary/Chest: Effort normal and breath sounds normal. No respiratory distress. She has no wheezes. She exhibits no tenderness.  Abdominal: Soft. Bowel sounds are normal. She exhibits no distension, no abdominal bruit and no mass. There is no tenderness.  Genitourinary: No breast swelling, tenderness, discharge or bleeding.  Breast exam: No mass, nodules, thickening, tenderness, bulging, retraction, inflamation, nipple discharge or skin changes noted.  No axillary or clavicular LA.      Musculoskeletal: Normal range of motion. She exhibits no edema or tenderness.  Lymphadenopathy:    She has no cervical  adenopathy.  Neurological: She is alert. She has normal reflexes. No cranial nerve deficit. She exhibits normal muscle tone. Coordination normal.  Skin: Skin is warm and dry. No rash noted. No erythema. No pallor.  Psychiatric: She has a normal mood and affect.          Assessment & Plan:   Problem List Items Addressed This Visit      Cardiovascular and Mediastinum   Essential hypertension - Primary    bp in fair control at this time  BP Readings from Last 1 Encounters:  09/04/15 124/68   No changes needed Disc lifstyle change with low sodium diet and exercise  Labs today      Relevant Medications   spironolactone (ALDACTONE) 25 MG tablet   metoprolol (LOPRESSOR) 100 MG tablet   amLODipine-valsartan (EXFORGE) 5-320 MG tablet   Other Relevant Orders   CBC with Differential/Platelet (Completed)   Comprehensive metabolic panel (Completed)   TSH (Completed)   Lipid panel (Completed)     Endocrine   Hypothyroidism    TSH today No clinical changes  Adj levothyroxine dose if necessary       Relevant Medications   metoprolol (LOPRESSOR) 100 MG tablet   levothyroxine (SYNTHROID, LEVOTHROID) 100 MCG tablet   Other Relevant Orders   TSH (Completed)     Nervous and Auditory   Senile dementia, uncomplicated    This is slowly progressing  Wants to avoid medication thus far  Generally content without behavioral issues        Relevant Medications   sertraline (ZOLOFT) 50 MG tablet     Other   Encounter for Medicare annual wellness exam    Reviewed health habits including diet and exercise and skin cancer prevention Reviewed appropriate screening tests for age  Also reviewed health mt list, fam hx and immunization status , as well as social and family history   See HPI Labs today See the info I gave you about a tetanus shot  Flu shot today prevnar shot today  If you are interested in a shingles/zoster vaccine - call your insurance to check on coverage,( you should  not get it within 1 month of other vaccines) , then call us for a prescription  for it to take to a pharmacy that gives the shot , or make a nurse visit to get it here depending on your coverage  labs  today  Stop the colace for a while  Stop flax seed Stop probiotic  Keep up the better water intake       Hyperlipidemia LDL goal <100    Disc goals for lipids and reasons to control them Rev labs with pt from last draw  Labs today Rev low sat fat diet in detail       Relevant Medications   spironolactone (ALDACTONE) 25 MG tablet   metoprolol (LOPRESSOR) 100 MG tablet   amLODipine-valsartan (EXFORGE) 5-320 MG tablet   Other Relevant Orders   Lipid panel (Completed)   Unspecified urinary incontinence    Disc opt for tx - declines urol consult or meds that could have side eff at this time Disc s/s of uti to watch for  Good hygiene        Other Visit Diagnoses    Need for vaccination with 13-polyvalent pneumococcal conjugate vaccine        Relevant Orders    Pneumococcal conjugate vaccine 13-valent (Completed)    Need for influenza vaccination        Relevant Orders    Flu Vaccine QUAD 36+ mos PF IM (Fluarix & Fluzone Quad PF) (Completed)

## 2015-09-04 NOTE — Telephone Encounter (Signed)
That is ok 

## 2015-09-06 NOTE — Assessment & Plan Note (Signed)
bp in fair control at this time  BP Readings from Last 1 Encounters:  09/04/15 124/68   No changes needed Disc lifstyle change with low sodium diet and exercise  Labs today

## 2015-09-06 NOTE — Assessment & Plan Note (Signed)
Disc goals for lipids and reasons to control them Rev labs with pt from last draw Labs today Rev low sat fat diet in detail   

## 2015-09-06 NOTE — Assessment & Plan Note (Signed)
TSH today No clinical changes  Adj levothyroxine dose if necessary

## 2015-09-06 NOTE — Assessment & Plan Note (Signed)
Reviewed health habits including diet and exercise and skin cancer prevention Reviewed appropriate screening tests for age  Also reviewed health mt list, fam hx and immunization status , as well as social and family history   See HPI Labs today See the info I gave you about a tetanus shot  Flu shot today prevnar shot today  If you are interested in a shingles/zoster vaccine - call your insurance to check on coverage,( you should not get it within 1 month of other vaccines) , then call us for a prescription  for it to take to a pharmacy that gives the shot , or make a nurse visit to get it here depending on your coverage  labs today  Stop the colace for a while  Stop flax seed Stop probiotic  Keep up the better water intake

## 2015-09-06 NOTE — Assessment & Plan Note (Signed)
Disc opt for tx - declines urol consult or meds that could have side eff at this time Disc s/s of uti to watch for  Good hygiene

## 2015-09-06 NOTE — Assessment & Plan Note (Signed)
This is slowly progressing  Wants to avoid medication thus far  Generally content without behavioral issues

## 2015-09-07 ENCOUNTER — Encounter: Payer: Self-pay | Admitting: *Deleted

## 2015-09-23 ENCOUNTER — Telehealth: Payer: Self-pay

## 2015-09-23 MED ORDER — DEXLANSOPRAZOLE 60 MG PO CPDR: 60.0000 mg | DELAYED_RELEASE_CAPSULE | Freq: Every day | ORAL | Status: DC

## 2015-09-23 NOTE — Telephone Encounter (Signed)
Gunnar FusiPaula (DPR signed) requesting refill dexilant to [facility] [place]. Pt had medicare wellness 09/04/15. Advised done. Gunnar Fusiaula will ck with pharmacy.

## 2015-09-24 MED ORDER — LANSOPRAZOLE 15 MG PO CPDR: 15.0000 mg | DELAYED_RELEASE_CAPSULE | Freq: Two times a day (BID) | ORAL | Status: DC

## 2015-09-24 NOTE — Addendum Note (Signed)
Addended by: Roxy MannsWER, MARNE A on: 09/24/2015 03:35 PM   Modules accepted: Orders, Medications

## 2015-09-24 NOTE — Telephone Encounter (Addendum)
Margaret Hicks (DPR signed) left v/m with Dexilant savings card cost to pt was $264.00 and Margaret Fusiaula request different med less expensive.[facility] [place]. Spoke with Margaret Hicks and pt does not have ins. Margaret Hicks request written order for generic OTC Prevacid sent to [facility] [place] until pt can get ins coverage.Please advise.

## 2015-09-24 NOTE — Telephone Encounter (Signed)
Since prevacid otc is not as strong-I will write for it to be dosed bid instead of daily  Keep us updated if any problems

## 2015-09-24 NOTE — Telephone Encounter (Signed)
Her phamacy is listed as [facility]? [facility]- I will write it for call in

## 2015-09-25 NOTE — Telephone Encounter (Signed)
Spoke with Daughter and pt needs an order faxed over to [facility] ([facility]isted [facility]) fax # [phone], saying to give pt the prevacid OTC BID, pt has about 10 days left of Dexilant so pt can wait until Dr. Milinda Antisower comes back on Monday to get order sent to nursing home, I will rout this back to Dr. Milinda Antisower on Monday when she returns

## 2015-09-28 NOTE — Telephone Encounter (Signed)
Order in IN box 

## 2015-09-29 NOTE — Telephone Encounter (Signed)
Order faxed.

## 2015-11-03 DIAGNOSIS — M79672 Pain in left foot: Secondary | ICD-10-CM | POA: Diagnosis not present

## 2015-11-03 DIAGNOSIS — M79671 Pain in right foot: Secondary | ICD-10-CM | POA: Diagnosis not present

## 2015-11-03 DIAGNOSIS — B351 Tinea unguium: Secondary | ICD-10-CM | POA: Diagnosis not present

## 2015-11-03 DIAGNOSIS — R262 Difficulty in walking, not elsewhere classified: Secondary | ICD-10-CM | POA: Diagnosis not present

## 2015-11-18 ENCOUNTER — Telehealth: Payer: Self-pay | Admitting: Family Medicine

## 2015-11-18 ENCOUNTER — Ambulatory Visit: Payer: Medicare Other | Admitting: Family Medicine

## 2015-11-18 NOTE — Telephone Encounter (Signed)
Please make sure she is ok- was supposed to come in for cough

## 2015-11-18 NOTE — Telephone Encounter (Signed)
Pt did not come in for their appt today for an acute visit. Please let me know if pt needs to be contacted immediately for follow up or no follow up needed. Best phone number to contact pt is [phone].

## 2015-11-19 ENCOUNTER — Encounter: Payer: Self-pay | Admitting: *Deleted

## 2015-11-19 NOTE — Telephone Encounter (Signed)
Spoke with pt's daughter, she stated that she called and left a message to cancel the appt because it was not needed.

## 2015-11-27 ENCOUNTER — Telehealth: Payer: Self-pay

## 2015-11-27 NOTE — Telephone Encounter (Signed)
Gunnar Fusiaula pts daughter said she is presently trying to find out what ins coverage pt has for meds and when she finds out definitely she will cb. Gunnar Fusiaula also has billing question and billing # given to ElliottPaula.

## 2016-02-02 DIAGNOSIS — M79671 Pain in right foot: Secondary | ICD-10-CM | POA: Diagnosis not present

## 2016-02-02 DIAGNOSIS — B351 Tinea unguium: Secondary | ICD-10-CM | POA: Diagnosis not present

## 2016-02-02 DIAGNOSIS — M79672 Pain in left foot: Secondary | ICD-10-CM | POA: Diagnosis not present

## 2016-02-03 DIAGNOSIS — M545 Low back pain: Secondary | ICD-10-CM | POA: Diagnosis not present

## 2016-02-03 DIAGNOSIS — Z741 Need for assistance with personal care: Secondary | ICD-10-CM | POA: Diagnosis not present

## 2016-02-03 DIAGNOSIS — F0390 Unspecified dementia without behavioral disturbance: Secondary | ICD-10-CM | POA: Diagnosis not present

## 2016-02-03 DIAGNOSIS — E02 Subclinical iodine-deficiency hypothyroidism: Secondary | ICD-10-CM | POA: Diagnosis not present

## 2016-02-03 DIAGNOSIS — I10 Essential (primary) hypertension: Secondary | ICD-10-CM | POA: Diagnosis not present

## 2016-02-03 DIAGNOSIS — M6281 Muscle weakness (generalized): Secondary | ICD-10-CM | POA: Diagnosis not present

## 2016-02-04 ENCOUNTER — Telehealth: Payer: Self-pay | Admitting: *Deleted

## 2016-02-04 DIAGNOSIS — E02 Subclinical iodine-deficiency hypothyroidism: Secondary | ICD-10-CM | POA: Diagnosis not present

## 2016-02-04 DIAGNOSIS — M545 Low back pain: Secondary | ICD-10-CM | POA: Diagnosis not present

## 2016-02-04 DIAGNOSIS — M6281 Muscle weakness (generalized): Secondary | ICD-10-CM | POA: Diagnosis not present

## 2016-02-04 DIAGNOSIS — Z741 Need for assistance with personal care: Secondary | ICD-10-CM | POA: Diagnosis not present

## 2016-02-04 DIAGNOSIS — F0390 Unspecified dementia without behavioral disturbance: Secondary | ICD-10-CM | POA: Diagnosis not present

## 2016-02-04 DIAGNOSIS — I10 Essential (primary) hypertension: Secondary | ICD-10-CM | POA: Diagnosis not present

## 2016-02-04 NOTE — Telephone Encounter (Signed)
Nurse at [facility] ([facility]urs[facility] left voicemail at Triage. They have had a really bad outbreak of the Flu type A, pt doesn't have any sxs but a lot of their residents are on Tamiflu prophylactic prescribed by their doctors so they were wondering if you want to prescribed pt Tamiflu, if so they would need an order sent to them at fax  [phone], please advise

## 2016-02-04 NOTE — Telephone Encounter (Signed)
I am fine with her getting tamiflu 75 mg 1 po qd for 7 d #7 no ref  I am not there to print it out for fax however  Would another provider be wiling to sign a written px or can they take the order verbally ?

## 2016-02-05 DIAGNOSIS — M6281 Muscle weakness (generalized): Secondary | ICD-10-CM | POA: Diagnosis not present

## 2016-02-05 DIAGNOSIS — M545 Low back pain: Secondary | ICD-10-CM | POA: Diagnosis not present

## 2016-02-05 DIAGNOSIS — F0390 Unspecified dementia without behavioral disturbance: Secondary | ICD-10-CM | POA: Diagnosis not present

## 2016-02-05 DIAGNOSIS — Z741 Need for assistance with personal care: Secondary | ICD-10-CM | POA: Diagnosis not present

## 2016-02-05 DIAGNOSIS — E02 Subclinical iodine-deficiency hypothyroidism: Secondary | ICD-10-CM | POA: Diagnosis not present

## 2016-02-05 DIAGNOSIS — I10 Essential (primary) hypertension: Secondary | ICD-10-CM | POA: Diagnosis not present

## 2016-02-05 MED ORDER — OSELTAMIVIR PHOSPHATE 75 MG PO CAPS: 75.0000 mg | ORAL_CAPSULE | Freq: Every day | ORAL | Status: DC

## 2016-02-05 NOTE — Telephone Encounter (Signed)
I was doing Triage yesterday and didn't see this, Rx printed and given to Dr. Milinda Antisower to sign so I can send to nursing home

## 2016-02-05 NOTE — Telephone Encounter (Signed)
Dr. Milinda Antisower signed orders and Rx faxed to [facility]

## 2016-02-08 DIAGNOSIS — Z741 Need for assistance with personal care: Secondary | ICD-10-CM | POA: Diagnosis not present

## 2016-02-08 DIAGNOSIS — I10 Essential (primary) hypertension: Secondary | ICD-10-CM | POA: Diagnosis not present

## 2016-02-08 DIAGNOSIS — M545 Low back pain: Secondary | ICD-10-CM | POA: Diagnosis not present

## 2016-02-08 DIAGNOSIS — E02 Subclinical iodine-deficiency hypothyroidism: Secondary | ICD-10-CM | POA: Diagnosis not present

## 2016-02-08 DIAGNOSIS — M6281 Muscle weakness (generalized): Secondary | ICD-10-CM | POA: Diagnosis not present

## 2016-02-08 DIAGNOSIS — F0390 Unspecified dementia without behavioral disturbance: Secondary | ICD-10-CM | POA: Diagnosis not present

## 2016-02-09 DIAGNOSIS — M545 Low back pain: Secondary | ICD-10-CM | POA: Diagnosis not present

## 2016-02-09 DIAGNOSIS — F0390 Unspecified dementia without behavioral disturbance: Secondary | ICD-10-CM | POA: Diagnosis not present

## 2016-02-09 DIAGNOSIS — I10 Essential (primary) hypertension: Secondary | ICD-10-CM | POA: Diagnosis not present

## 2016-02-09 DIAGNOSIS — M6281 Muscle weakness (generalized): Secondary | ICD-10-CM | POA: Diagnosis not present

## 2016-02-09 DIAGNOSIS — E02 Subclinical iodine-deficiency hypothyroidism: Secondary | ICD-10-CM | POA: Diagnosis not present

## 2016-02-09 DIAGNOSIS — Z741 Need for assistance with personal care: Secondary | ICD-10-CM | POA: Diagnosis not present

## 2016-02-10 DIAGNOSIS — I10 Essential (primary) hypertension: Secondary | ICD-10-CM | POA: Diagnosis not present

## 2016-02-10 DIAGNOSIS — Z741 Need for assistance with personal care: Secondary | ICD-10-CM | POA: Diagnosis not present

## 2016-02-10 DIAGNOSIS — F0390 Unspecified dementia without behavioral disturbance: Secondary | ICD-10-CM | POA: Diagnosis not present

## 2016-02-10 DIAGNOSIS — M6281 Muscle weakness (generalized): Secondary | ICD-10-CM | POA: Diagnosis not present

## 2016-02-10 DIAGNOSIS — M545 Low back pain: Secondary | ICD-10-CM | POA: Diagnosis not present

## 2016-02-10 DIAGNOSIS — E02 Subclinical iodine-deficiency hypothyroidism: Secondary | ICD-10-CM | POA: Diagnosis not present

## 2016-02-11 DIAGNOSIS — F0390 Unspecified dementia without behavioral disturbance: Secondary | ICD-10-CM | POA: Diagnosis not present

## 2016-02-11 DIAGNOSIS — I10 Essential (primary) hypertension: Secondary | ICD-10-CM | POA: Diagnosis not present

## 2016-02-11 DIAGNOSIS — M6281 Muscle weakness (generalized): Secondary | ICD-10-CM | POA: Diagnosis not present

## 2016-02-11 DIAGNOSIS — E02 Subclinical iodine-deficiency hypothyroidism: Secondary | ICD-10-CM | POA: Diagnosis not present

## 2016-02-11 DIAGNOSIS — M545 Low back pain: Secondary | ICD-10-CM | POA: Diagnosis not present

## 2016-02-11 DIAGNOSIS — Z741 Need for assistance with personal care: Secondary | ICD-10-CM | POA: Diagnosis not present

## 2016-02-12 DIAGNOSIS — F0390 Unspecified dementia without behavioral disturbance: Secondary | ICD-10-CM | POA: Diagnosis not present

## 2016-02-12 DIAGNOSIS — E02 Subclinical iodine-deficiency hypothyroidism: Secondary | ICD-10-CM | POA: Diagnosis not present

## 2016-02-12 DIAGNOSIS — M6281 Muscle weakness (generalized): Secondary | ICD-10-CM | POA: Diagnosis not present

## 2016-02-12 DIAGNOSIS — I10 Essential (primary) hypertension: Secondary | ICD-10-CM | POA: Diagnosis not present

## 2016-02-12 DIAGNOSIS — M545 Low back pain: Secondary | ICD-10-CM | POA: Diagnosis not present

## 2016-02-12 DIAGNOSIS — Z741 Need for assistance with personal care: Secondary | ICD-10-CM | POA: Diagnosis not present

## 2016-02-14 DIAGNOSIS — M6281 Muscle weakness (generalized): Secondary | ICD-10-CM | POA: Diagnosis not present

## 2016-02-14 DIAGNOSIS — E02 Subclinical iodine-deficiency hypothyroidism: Secondary | ICD-10-CM | POA: Diagnosis not present

## 2016-02-14 DIAGNOSIS — F0390 Unspecified dementia without behavioral disturbance: Secondary | ICD-10-CM | POA: Diagnosis not present

## 2016-02-14 DIAGNOSIS — M545 Low back pain: Secondary | ICD-10-CM | POA: Diagnosis not present

## 2016-02-14 DIAGNOSIS — I10 Essential (primary) hypertension: Secondary | ICD-10-CM | POA: Diagnosis not present

## 2016-02-14 DIAGNOSIS — Z741 Need for assistance with personal care: Secondary | ICD-10-CM | POA: Diagnosis not present

## 2016-02-15 DIAGNOSIS — Z741 Need for assistance with personal care: Secondary | ICD-10-CM | POA: Diagnosis not present

## 2016-02-15 DIAGNOSIS — E02 Subclinical iodine-deficiency hypothyroidism: Secondary | ICD-10-CM | POA: Diagnosis not present

## 2016-02-15 DIAGNOSIS — M6281 Muscle weakness (generalized): Secondary | ICD-10-CM | POA: Diagnosis not present

## 2016-02-15 DIAGNOSIS — I10 Essential (primary) hypertension: Secondary | ICD-10-CM | POA: Diagnosis not present

## 2016-02-15 DIAGNOSIS — M545 Low back pain: Secondary | ICD-10-CM | POA: Diagnosis not present

## 2016-02-15 DIAGNOSIS — F0390 Unspecified dementia without behavioral disturbance: Secondary | ICD-10-CM | POA: Diagnosis not present

## 2016-02-16 DIAGNOSIS — M545 Low back pain: Secondary | ICD-10-CM | POA: Diagnosis not present

## 2016-02-16 DIAGNOSIS — M6281 Muscle weakness (generalized): Secondary | ICD-10-CM | POA: Diagnosis not present

## 2016-02-16 DIAGNOSIS — F0390 Unspecified dementia without behavioral disturbance: Secondary | ICD-10-CM | POA: Diagnosis not present

## 2016-02-16 DIAGNOSIS — E02 Subclinical iodine-deficiency hypothyroidism: Secondary | ICD-10-CM | POA: Diagnosis not present

## 2016-02-16 DIAGNOSIS — I10 Essential (primary) hypertension: Secondary | ICD-10-CM | POA: Diagnosis not present

## 2016-02-16 DIAGNOSIS — Z741 Need for assistance with personal care: Secondary | ICD-10-CM | POA: Diagnosis not present

## 2016-02-17 DIAGNOSIS — E02 Subclinical iodine-deficiency hypothyroidism: Secondary | ICD-10-CM | POA: Diagnosis not present

## 2016-02-17 DIAGNOSIS — Z741 Need for assistance with personal care: Secondary | ICD-10-CM | POA: Diagnosis not present

## 2016-02-17 DIAGNOSIS — M545 Low back pain: Secondary | ICD-10-CM | POA: Diagnosis not present

## 2016-02-17 DIAGNOSIS — I10 Essential (primary) hypertension: Secondary | ICD-10-CM | POA: Diagnosis not present

## 2016-02-17 DIAGNOSIS — M6281 Muscle weakness (generalized): Secondary | ICD-10-CM | POA: Diagnosis not present

## 2016-02-17 DIAGNOSIS — F0390 Unspecified dementia without behavioral disturbance: Secondary | ICD-10-CM | POA: Diagnosis not present

## 2016-02-18 DIAGNOSIS — I10 Essential (primary) hypertension: Secondary | ICD-10-CM | POA: Diagnosis not present

## 2016-02-18 DIAGNOSIS — Z741 Need for assistance with personal care: Secondary | ICD-10-CM | POA: Diagnosis not present

## 2016-02-18 DIAGNOSIS — F0390 Unspecified dementia without behavioral disturbance: Secondary | ICD-10-CM | POA: Diagnosis not present

## 2016-02-18 DIAGNOSIS — M6281 Muscle weakness (generalized): Secondary | ICD-10-CM | POA: Diagnosis not present

## 2016-02-18 DIAGNOSIS — M545 Low back pain: Secondary | ICD-10-CM | POA: Diagnosis not present

## 2016-02-18 DIAGNOSIS — E02 Subclinical iodine-deficiency hypothyroidism: Secondary | ICD-10-CM | POA: Diagnosis not present

## 2016-02-19 DIAGNOSIS — Z741 Need for assistance with personal care: Secondary | ICD-10-CM | POA: Diagnosis not present

## 2016-02-19 DIAGNOSIS — E02 Subclinical iodine-deficiency hypothyroidism: Secondary | ICD-10-CM | POA: Diagnosis not present

## 2016-02-19 DIAGNOSIS — M6281 Muscle weakness (generalized): Secondary | ICD-10-CM | POA: Diagnosis not present

## 2016-02-19 DIAGNOSIS — I10 Essential (primary) hypertension: Secondary | ICD-10-CM | POA: Diagnosis not present

## 2016-02-19 DIAGNOSIS — F0390 Unspecified dementia without behavioral disturbance: Secondary | ICD-10-CM | POA: Diagnosis not present

## 2016-02-19 DIAGNOSIS — M545 Low back pain: Secondary | ICD-10-CM | POA: Diagnosis not present

## 2016-02-21 DIAGNOSIS — F0390 Unspecified dementia without behavioral disturbance: Secondary | ICD-10-CM | POA: Diagnosis not present

## 2016-02-21 DIAGNOSIS — M6281 Muscle weakness (generalized): Secondary | ICD-10-CM | POA: Diagnosis not present

## 2016-02-21 DIAGNOSIS — Z741 Need for assistance with personal care: Secondary | ICD-10-CM | POA: Diagnosis not present

## 2016-02-21 DIAGNOSIS — I10 Essential (primary) hypertension: Secondary | ICD-10-CM | POA: Diagnosis not present

## 2016-02-21 DIAGNOSIS — M545 Low back pain: Secondary | ICD-10-CM | POA: Diagnosis not present

## 2016-02-21 DIAGNOSIS — E02 Subclinical iodine-deficiency hypothyroidism: Secondary | ICD-10-CM | POA: Diagnosis not present

## 2016-02-22 DIAGNOSIS — M6281 Muscle weakness (generalized): Secondary | ICD-10-CM | POA: Diagnosis not present

## 2016-02-22 DIAGNOSIS — I10 Essential (primary) hypertension: Secondary | ICD-10-CM | POA: Diagnosis not present

## 2016-02-22 DIAGNOSIS — E02 Subclinical iodine-deficiency hypothyroidism: Secondary | ICD-10-CM | POA: Diagnosis not present

## 2016-02-22 DIAGNOSIS — M545 Low back pain: Secondary | ICD-10-CM | POA: Diagnosis not present

## 2016-02-22 DIAGNOSIS — Z741 Need for assistance with personal care: Secondary | ICD-10-CM | POA: Diagnosis not present

## 2016-02-22 DIAGNOSIS — F0390 Unspecified dementia without behavioral disturbance: Secondary | ICD-10-CM | POA: Diagnosis not present

## 2016-02-23 DIAGNOSIS — I10 Essential (primary) hypertension: Secondary | ICD-10-CM | POA: Diagnosis not present

## 2016-02-23 DIAGNOSIS — F0390 Unspecified dementia without behavioral disturbance: Secondary | ICD-10-CM | POA: Diagnosis not present

## 2016-02-23 DIAGNOSIS — M6281 Muscle weakness (generalized): Secondary | ICD-10-CM | POA: Diagnosis not present

## 2016-02-23 DIAGNOSIS — E02 Subclinical iodine-deficiency hypothyroidism: Secondary | ICD-10-CM | POA: Diagnosis not present

## 2016-02-23 DIAGNOSIS — Z741 Need for assistance with personal care: Secondary | ICD-10-CM | POA: Diagnosis not present

## 2016-02-23 DIAGNOSIS — M545 Low back pain: Secondary | ICD-10-CM | POA: Diagnosis not present

## 2016-02-24 DIAGNOSIS — E02 Subclinical iodine-deficiency hypothyroidism: Secondary | ICD-10-CM | POA: Diagnosis not present

## 2016-02-24 DIAGNOSIS — Z741 Need for assistance with personal care: Secondary | ICD-10-CM | POA: Diagnosis not present

## 2016-02-24 DIAGNOSIS — M545 Low back pain: Secondary | ICD-10-CM | POA: Diagnosis not present

## 2016-02-24 DIAGNOSIS — I10 Essential (primary) hypertension: Secondary | ICD-10-CM | POA: Diagnosis not present

## 2016-02-24 DIAGNOSIS — M6281 Muscle weakness (generalized): Secondary | ICD-10-CM | POA: Diagnosis not present

## 2016-02-24 DIAGNOSIS — F0390 Unspecified dementia without behavioral disturbance: Secondary | ICD-10-CM | POA: Diagnosis not present

## 2016-02-25 DIAGNOSIS — F0390 Unspecified dementia without behavioral disturbance: Secondary | ICD-10-CM | POA: Diagnosis not present

## 2016-02-25 DIAGNOSIS — M545 Low back pain: Secondary | ICD-10-CM | POA: Diagnosis not present

## 2016-02-25 DIAGNOSIS — I10 Essential (primary) hypertension: Secondary | ICD-10-CM | POA: Diagnosis not present

## 2016-02-25 DIAGNOSIS — Z741 Need for assistance with personal care: Secondary | ICD-10-CM | POA: Diagnosis not present

## 2016-02-25 DIAGNOSIS — M6281 Muscle weakness (generalized): Secondary | ICD-10-CM | POA: Diagnosis not present

## 2016-02-25 DIAGNOSIS — E02 Subclinical iodine-deficiency hypothyroidism: Secondary | ICD-10-CM | POA: Diagnosis not present

## 2016-02-26 DIAGNOSIS — M6281 Muscle weakness (generalized): Secondary | ICD-10-CM | POA: Diagnosis not present

## 2016-02-26 DIAGNOSIS — I10 Essential (primary) hypertension: Secondary | ICD-10-CM | POA: Diagnosis not present

## 2016-02-26 DIAGNOSIS — F0390 Unspecified dementia without behavioral disturbance: Secondary | ICD-10-CM | POA: Diagnosis not present

## 2016-02-26 DIAGNOSIS — M545 Low back pain: Secondary | ICD-10-CM | POA: Diagnosis not present

## 2016-02-26 DIAGNOSIS — Z741 Need for assistance with personal care: Secondary | ICD-10-CM | POA: Diagnosis not present

## 2016-02-26 DIAGNOSIS — E02 Subclinical iodine-deficiency hypothyroidism: Secondary | ICD-10-CM | POA: Diagnosis not present

## 2016-02-29 DIAGNOSIS — F0390 Unspecified dementia without behavioral disturbance: Secondary | ICD-10-CM | POA: Diagnosis not present

## 2016-02-29 DIAGNOSIS — E02 Subclinical iodine-deficiency hypothyroidism: Secondary | ICD-10-CM | POA: Diagnosis not present

## 2016-02-29 DIAGNOSIS — M6281 Muscle weakness (generalized): Secondary | ICD-10-CM | POA: Diagnosis not present

## 2016-02-29 DIAGNOSIS — M545 Low back pain: Secondary | ICD-10-CM | POA: Diagnosis not present

## 2016-02-29 DIAGNOSIS — Z741 Need for assistance with personal care: Secondary | ICD-10-CM | POA: Diagnosis not present

## 2016-02-29 DIAGNOSIS — I10 Essential (primary) hypertension: Secondary | ICD-10-CM | POA: Diagnosis not present

## 2016-03-01 DIAGNOSIS — M6281 Muscle weakness (generalized): Secondary | ICD-10-CM | POA: Diagnosis not present

## 2016-03-01 DIAGNOSIS — M545 Low back pain: Secondary | ICD-10-CM | POA: Diagnosis not present

## 2016-03-01 DIAGNOSIS — I10 Essential (primary) hypertension: Secondary | ICD-10-CM | POA: Diagnosis not present

## 2016-03-01 DIAGNOSIS — F0390 Unspecified dementia without behavioral disturbance: Secondary | ICD-10-CM | POA: Diagnosis not present

## 2016-03-01 DIAGNOSIS — Z741 Need for assistance with personal care: Secondary | ICD-10-CM | POA: Diagnosis not present

## 2016-03-01 DIAGNOSIS — E02 Subclinical iodine-deficiency hypothyroidism: Secondary | ICD-10-CM | POA: Diagnosis not present

## 2016-03-02 DIAGNOSIS — F0390 Unspecified dementia without behavioral disturbance: Secondary | ICD-10-CM | POA: Diagnosis not present

## 2016-03-02 DIAGNOSIS — M6281 Muscle weakness (generalized): Secondary | ICD-10-CM | POA: Diagnosis not present

## 2016-03-02 DIAGNOSIS — M545 Low back pain: Secondary | ICD-10-CM | POA: Diagnosis not present

## 2016-03-02 DIAGNOSIS — Z741 Need for assistance with personal care: Secondary | ICD-10-CM | POA: Diagnosis not present

## 2016-03-02 DIAGNOSIS — E02 Subclinical iodine-deficiency hypothyroidism: Secondary | ICD-10-CM | POA: Diagnosis not present

## 2016-03-02 DIAGNOSIS — I10 Essential (primary) hypertension: Secondary | ICD-10-CM | POA: Diagnosis not present

## 2016-03-03 DIAGNOSIS — E02 Subclinical iodine-deficiency hypothyroidism: Secondary | ICD-10-CM | POA: Diagnosis not present

## 2016-03-03 DIAGNOSIS — I10 Essential (primary) hypertension: Secondary | ICD-10-CM | POA: Diagnosis not present

## 2016-03-03 DIAGNOSIS — M6281 Muscle weakness (generalized): Secondary | ICD-10-CM | POA: Diagnosis not present

## 2016-03-03 DIAGNOSIS — Z741 Need for assistance with personal care: Secondary | ICD-10-CM | POA: Diagnosis not present

## 2016-03-03 DIAGNOSIS — F0390 Unspecified dementia without behavioral disturbance: Secondary | ICD-10-CM | POA: Diagnosis not present

## 2016-03-03 DIAGNOSIS — M545 Low back pain: Secondary | ICD-10-CM | POA: Diagnosis not present

## 2016-03-04 DIAGNOSIS — Z741 Need for assistance with personal care: Secondary | ICD-10-CM | POA: Diagnosis not present

## 2016-03-04 DIAGNOSIS — E02 Subclinical iodine-deficiency hypothyroidism: Secondary | ICD-10-CM | POA: Diagnosis not present

## 2016-03-04 DIAGNOSIS — I10 Essential (primary) hypertension: Secondary | ICD-10-CM | POA: Diagnosis not present

## 2016-03-04 DIAGNOSIS — F0390 Unspecified dementia without behavioral disturbance: Secondary | ICD-10-CM | POA: Diagnosis not present

## 2016-03-04 DIAGNOSIS — M6281 Muscle weakness (generalized): Secondary | ICD-10-CM | POA: Diagnosis not present

## 2016-03-04 DIAGNOSIS — M545 Low back pain: Secondary | ICD-10-CM | POA: Diagnosis not present

## 2016-03-05 DIAGNOSIS — M6281 Muscle weakness (generalized): Secondary | ICD-10-CM | POA: Diagnosis not present

## 2016-03-05 DIAGNOSIS — E02 Subclinical iodine-deficiency hypothyroidism: Secondary | ICD-10-CM | POA: Diagnosis not present

## 2016-03-05 DIAGNOSIS — I10 Essential (primary) hypertension: Secondary | ICD-10-CM | POA: Diagnosis not present

## 2016-03-05 DIAGNOSIS — F0390 Unspecified dementia without behavioral disturbance: Secondary | ICD-10-CM | POA: Diagnosis not present

## 2016-03-05 DIAGNOSIS — Z741 Need for assistance with personal care: Secondary | ICD-10-CM | POA: Diagnosis not present

## 2016-03-05 DIAGNOSIS — M545 Low back pain: Secondary | ICD-10-CM | POA: Diagnosis not present

## 2016-03-07 DIAGNOSIS — M545 Low back pain: Secondary | ICD-10-CM | POA: Diagnosis not present

## 2016-03-07 DIAGNOSIS — F0390 Unspecified dementia without behavioral disturbance: Secondary | ICD-10-CM | POA: Diagnosis not present

## 2016-03-07 DIAGNOSIS — E02 Subclinical iodine-deficiency hypothyroidism: Secondary | ICD-10-CM | POA: Diagnosis not present

## 2016-03-07 DIAGNOSIS — M6281 Muscle weakness (generalized): Secondary | ICD-10-CM | POA: Diagnosis not present

## 2016-03-07 DIAGNOSIS — Z741 Need for assistance with personal care: Secondary | ICD-10-CM | POA: Diagnosis not present

## 2016-03-07 DIAGNOSIS — I10 Essential (primary) hypertension: Secondary | ICD-10-CM | POA: Diagnosis not present

## 2016-03-08 DIAGNOSIS — F0390 Unspecified dementia without behavioral disturbance: Secondary | ICD-10-CM | POA: Diagnosis not present

## 2016-03-08 DIAGNOSIS — M6281 Muscle weakness (generalized): Secondary | ICD-10-CM | POA: Diagnosis not present

## 2016-03-08 DIAGNOSIS — I10 Essential (primary) hypertension: Secondary | ICD-10-CM | POA: Diagnosis not present

## 2016-03-08 DIAGNOSIS — M545 Low back pain: Secondary | ICD-10-CM | POA: Diagnosis not present

## 2016-03-08 DIAGNOSIS — E02 Subclinical iodine-deficiency hypothyroidism: Secondary | ICD-10-CM | POA: Diagnosis not present

## 2016-03-08 DIAGNOSIS — Z741 Need for assistance with personal care: Secondary | ICD-10-CM | POA: Diagnosis not present

## 2016-03-09 DIAGNOSIS — E02 Subclinical iodine-deficiency hypothyroidism: Secondary | ICD-10-CM | POA: Diagnosis not present

## 2016-03-09 DIAGNOSIS — M6281 Muscle weakness (generalized): Secondary | ICD-10-CM | POA: Diagnosis not present

## 2016-03-09 DIAGNOSIS — Z741 Need for assistance with personal care: Secondary | ICD-10-CM | POA: Diagnosis not present

## 2016-03-09 DIAGNOSIS — M545 Low back pain: Secondary | ICD-10-CM | POA: Diagnosis not present

## 2016-03-09 DIAGNOSIS — I10 Essential (primary) hypertension: Secondary | ICD-10-CM | POA: Diagnosis not present

## 2016-03-09 DIAGNOSIS — F0390 Unspecified dementia without behavioral disturbance: Secondary | ICD-10-CM | POA: Diagnosis not present

## 2016-03-10 DIAGNOSIS — E02 Subclinical iodine-deficiency hypothyroidism: Secondary | ICD-10-CM | POA: Diagnosis not present

## 2016-03-10 DIAGNOSIS — M6281 Muscle weakness (generalized): Secondary | ICD-10-CM | POA: Diagnosis not present

## 2016-03-10 DIAGNOSIS — M545 Low back pain: Secondary | ICD-10-CM | POA: Diagnosis not present

## 2016-03-10 DIAGNOSIS — F0390 Unspecified dementia without behavioral disturbance: Secondary | ICD-10-CM | POA: Diagnosis not present

## 2016-03-10 DIAGNOSIS — Z741 Need for assistance with personal care: Secondary | ICD-10-CM | POA: Diagnosis not present

## 2016-03-10 DIAGNOSIS — I10 Essential (primary) hypertension: Secondary | ICD-10-CM | POA: Diagnosis not present

## 2016-03-11 DIAGNOSIS — E02 Subclinical iodine-deficiency hypothyroidism: Secondary | ICD-10-CM | POA: Diagnosis not present

## 2016-03-11 DIAGNOSIS — M545 Low back pain: Secondary | ICD-10-CM | POA: Diagnosis not present

## 2016-03-11 DIAGNOSIS — I10 Essential (primary) hypertension: Secondary | ICD-10-CM | POA: Diagnosis not present

## 2016-03-11 DIAGNOSIS — F0390 Unspecified dementia without behavioral disturbance: Secondary | ICD-10-CM | POA: Diagnosis not present

## 2016-03-11 DIAGNOSIS — Z741 Need for assistance with personal care: Secondary | ICD-10-CM | POA: Diagnosis not present

## 2016-03-11 DIAGNOSIS — M6281 Muscle weakness (generalized): Secondary | ICD-10-CM | POA: Diagnosis not present

## 2016-03-14 DIAGNOSIS — I10 Essential (primary) hypertension: Secondary | ICD-10-CM | POA: Diagnosis not present

## 2016-03-14 DIAGNOSIS — Z741 Need for assistance with personal care: Secondary | ICD-10-CM | POA: Diagnosis not present

## 2016-03-14 DIAGNOSIS — F0390 Unspecified dementia without behavioral disturbance: Secondary | ICD-10-CM | POA: Diagnosis not present

## 2016-03-14 DIAGNOSIS — E02 Subclinical iodine-deficiency hypothyroidism: Secondary | ICD-10-CM | POA: Diagnosis not present

## 2016-03-14 DIAGNOSIS — M545 Low back pain: Secondary | ICD-10-CM | POA: Diagnosis not present

## 2016-03-14 DIAGNOSIS — M6281 Muscle weakness (generalized): Secondary | ICD-10-CM | POA: Diagnosis not present

## 2016-03-15 DIAGNOSIS — M545 Low back pain: Secondary | ICD-10-CM | POA: Diagnosis not present

## 2016-03-15 DIAGNOSIS — M6281 Muscle weakness (generalized): Secondary | ICD-10-CM | POA: Diagnosis not present

## 2016-03-15 DIAGNOSIS — F0390 Unspecified dementia without behavioral disturbance: Secondary | ICD-10-CM | POA: Diagnosis not present

## 2016-03-15 DIAGNOSIS — I10 Essential (primary) hypertension: Secondary | ICD-10-CM | POA: Diagnosis not present

## 2016-03-15 DIAGNOSIS — E02 Subclinical iodine-deficiency hypothyroidism: Secondary | ICD-10-CM | POA: Diagnosis not present

## 2016-03-15 DIAGNOSIS — Z741 Need for assistance with personal care: Secondary | ICD-10-CM | POA: Diagnosis not present

## 2016-03-16 DIAGNOSIS — E02 Subclinical iodine-deficiency hypothyroidism: Secondary | ICD-10-CM | POA: Diagnosis not present

## 2016-03-16 DIAGNOSIS — Z741 Need for assistance with personal care: Secondary | ICD-10-CM | POA: Diagnosis not present

## 2016-03-16 DIAGNOSIS — M545 Low back pain: Secondary | ICD-10-CM | POA: Diagnosis not present

## 2016-03-16 DIAGNOSIS — F0390 Unspecified dementia without behavioral disturbance: Secondary | ICD-10-CM | POA: Diagnosis not present

## 2016-03-16 DIAGNOSIS — I10 Essential (primary) hypertension: Secondary | ICD-10-CM | POA: Diagnosis not present

## 2016-03-16 DIAGNOSIS — M6281 Muscle weakness (generalized): Secondary | ICD-10-CM | POA: Diagnosis not present

## 2016-03-17 DIAGNOSIS — E02 Subclinical iodine-deficiency hypothyroidism: Secondary | ICD-10-CM | POA: Diagnosis not present

## 2016-03-17 DIAGNOSIS — M6281 Muscle weakness (generalized): Secondary | ICD-10-CM | POA: Diagnosis not present

## 2016-03-17 DIAGNOSIS — Z741 Need for assistance with personal care: Secondary | ICD-10-CM | POA: Diagnosis not present

## 2016-03-17 DIAGNOSIS — M545 Low back pain: Secondary | ICD-10-CM | POA: Diagnosis not present

## 2016-03-17 DIAGNOSIS — I10 Essential (primary) hypertension: Secondary | ICD-10-CM | POA: Diagnosis not present

## 2016-03-17 DIAGNOSIS — F0390 Unspecified dementia without behavioral disturbance: Secondary | ICD-10-CM | POA: Diagnosis not present

## 2016-03-18 DIAGNOSIS — I10 Essential (primary) hypertension: Secondary | ICD-10-CM | POA: Diagnosis not present

## 2016-03-18 DIAGNOSIS — Z741 Need for assistance with personal care: Secondary | ICD-10-CM | POA: Diagnosis not present

## 2016-03-18 DIAGNOSIS — F0390 Unspecified dementia without behavioral disturbance: Secondary | ICD-10-CM | POA: Diagnosis not present

## 2016-03-18 DIAGNOSIS — M6281 Muscle weakness (generalized): Secondary | ICD-10-CM | POA: Diagnosis not present

## 2016-03-18 DIAGNOSIS — M545 Low back pain: Secondary | ICD-10-CM | POA: Diagnosis not present

## 2016-03-18 DIAGNOSIS — E02 Subclinical iodine-deficiency hypothyroidism: Secondary | ICD-10-CM | POA: Diagnosis not present

## 2016-03-21 DIAGNOSIS — F0390 Unspecified dementia without behavioral disturbance: Secondary | ICD-10-CM | POA: Diagnosis not present

## 2016-03-21 DIAGNOSIS — M6281 Muscle weakness (generalized): Secondary | ICD-10-CM | POA: Diagnosis not present

## 2016-03-21 DIAGNOSIS — Z741 Need for assistance with personal care: Secondary | ICD-10-CM | POA: Diagnosis not present

## 2016-03-21 DIAGNOSIS — E02 Subclinical iodine-deficiency hypothyroidism: Secondary | ICD-10-CM | POA: Diagnosis not present

## 2016-03-21 DIAGNOSIS — I10 Essential (primary) hypertension: Secondary | ICD-10-CM | POA: Diagnosis not present

## 2016-03-21 DIAGNOSIS — M545 Low back pain: Secondary | ICD-10-CM | POA: Diagnosis not present

## 2016-03-22 DIAGNOSIS — Z741 Need for assistance with personal care: Secondary | ICD-10-CM | POA: Diagnosis not present

## 2016-03-22 DIAGNOSIS — F0390 Unspecified dementia without behavioral disturbance: Secondary | ICD-10-CM | POA: Diagnosis not present

## 2016-03-22 DIAGNOSIS — M545 Low back pain: Secondary | ICD-10-CM | POA: Diagnosis not present

## 2016-03-22 DIAGNOSIS — E02 Subclinical iodine-deficiency hypothyroidism: Secondary | ICD-10-CM | POA: Diagnosis not present

## 2016-03-22 DIAGNOSIS — M6281 Muscle weakness (generalized): Secondary | ICD-10-CM | POA: Diagnosis not present

## 2016-03-22 DIAGNOSIS — I10 Essential (primary) hypertension: Secondary | ICD-10-CM | POA: Diagnosis not present

## 2016-03-23 DIAGNOSIS — M6281 Muscle weakness (generalized): Secondary | ICD-10-CM | POA: Diagnosis not present

## 2016-03-23 DIAGNOSIS — I10 Essential (primary) hypertension: Secondary | ICD-10-CM | POA: Diagnosis not present

## 2016-03-23 DIAGNOSIS — E02 Subclinical iodine-deficiency hypothyroidism: Secondary | ICD-10-CM | POA: Diagnosis not present

## 2016-03-23 DIAGNOSIS — Z741 Need for assistance with personal care: Secondary | ICD-10-CM | POA: Diagnosis not present

## 2016-03-23 DIAGNOSIS — F0390 Unspecified dementia without behavioral disturbance: Secondary | ICD-10-CM | POA: Diagnosis not present

## 2016-03-23 DIAGNOSIS — M545 Low back pain: Secondary | ICD-10-CM | POA: Diagnosis not present

## 2016-03-24 DIAGNOSIS — I10 Essential (primary) hypertension: Secondary | ICD-10-CM | POA: Diagnosis not present

## 2016-03-24 DIAGNOSIS — E02 Subclinical iodine-deficiency hypothyroidism: Secondary | ICD-10-CM | POA: Diagnosis not present

## 2016-03-24 DIAGNOSIS — M6281 Muscle weakness (generalized): Secondary | ICD-10-CM | POA: Diagnosis not present

## 2016-03-24 DIAGNOSIS — Z741 Need for assistance with personal care: Secondary | ICD-10-CM | POA: Diagnosis not present

## 2016-03-24 DIAGNOSIS — M545 Low back pain: Secondary | ICD-10-CM | POA: Diagnosis not present

## 2016-03-24 DIAGNOSIS — F0390 Unspecified dementia without behavioral disturbance: Secondary | ICD-10-CM | POA: Diagnosis not present

## 2016-03-25 DIAGNOSIS — E02 Subclinical iodine-deficiency hypothyroidism: Secondary | ICD-10-CM | POA: Diagnosis not present

## 2016-03-25 DIAGNOSIS — Z741 Need for assistance with personal care: Secondary | ICD-10-CM | POA: Diagnosis not present

## 2016-03-25 DIAGNOSIS — I10 Essential (primary) hypertension: Secondary | ICD-10-CM | POA: Diagnosis not present

## 2016-03-25 DIAGNOSIS — M6281 Muscle weakness (generalized): Secondary | ICD-10-CM | POA: Diagnosis not present

## 2016-03-25 DIAGNOSIS — F0390 Unspecified dementia without behavioral disturbance: Secondary | ICD-10-CM | POA: Diagnosis not present

## 2016-03-25 DIAGNOSIS — M545 Low back pain: Secondary | ICD-10-CM | POA: Diagnosis not present

## 2016-03-28 DIAGNOSIS — M6281 Muscle weakness (generalized): Secondary | ICD-10-CM | POA: Diagnosis not present

## 2016-03-28 DIAGNOSIS — E02 Subclinical iodine-deficiency hypothyroidism: Secondary | ICD-10-CM | POA: Diagnosis not present

## 2016-03-28 DIAGNOSIS — F0390 Unspecified dementia without behavioral disturbance: Secondary | ICD-10-CM | POA: Diagnosis not present

## 2016-03-28 DIAGNOSIS — I10 Essential (primary) hypertension: Secondary | ICD-10-CM | POA: Diagnosis not present

## 2016-03-28 DIAGNOSIS — Z741 Need for assistance with personal care: Secondary | ICD-10-CM | POA: Diagnosis not present

## 2016-03-28 DIAGNOSIS — M545 Low back pain: Secondary | ICD-10-CM | POA: Diagnosis not present

## 2016-03-29 DIAGNOSIS — F0390 Unspecified dementia without behavioral disturbance: Secondary | ICD-10-CM | POA: Diagnosis not present

## 2016-03-29 DIAGNOSIS — Z741 Need for assistance with personal care: Secondary | ICD-10-CM | POA: Diagnosis not present

## 2016-03-29 DIAGNOSIS — E02 Subclinical iodine-deficiency hypothyroidism: Secondary | ICD-10-CM | POA: Diagnosis not present

## 2016-03-29 DIAGNOSIS — M6281 Muscle weakness (generalized): Secondary | ICD-10-CM | POA: Diagnosis not present

## 2016-03-29 DIAGNOSIS — M545 Low back pain: Secondary | ICD-10-CM | POA: Diagnosis not present

## 2016-03-29 DIAGNOSIS — I10 Essential (primary) hypertension: Secondary | ICD-10-CM | POA: Diagnosis not present

## 2016-03-30 DIAGNOSIS — F0390 Unspecified dementia without behavioral disturbance: Secondary | ICD-10-CM | POA: Diagnosis not present

## 2016-03-30 DIAGNOSIS — I10 Essential (primary) hypertension: Secondary | ICD-10-CM | POA: Diagnosis not present

## 2016-03-30 DIAGNOSIS — M545 Low back pain: Secondary | ICD-10-CM | POA: Diagnosis not present

## 2016-03-30 DIAGNOSIS — M6281 Muscle weakness (generalized): Secondary | ICD-10-CM | POA: Diagnosis not present

## 2016-03-30 DIAGNOSIS — Z741 Need for assistance with personal care: Secondary | ICD-10-CM | POA: Diagnosis not present

## 2016-03-30 DIAGNOSIS — E02 Subclinical iodine-deficiency hypothyroidism: Secondary | ICD-10-CM | POA: Diagnosis not present

## 2016-03-31 DIAGNOSIS — F0390 Unspecified dementia without behavioral disturbance: Secondary | ICD-10-CM | POA: Diagnosis not present

## 2016-03-31 DIAGNOSIS — I10 Essential (primary) hypertension: Secondary | ICD-10-CM | POA: Diagnosis not present

## 2016-03-31 DIAGNOSIS — M545 Low back pain: Secondary | ICD-10-CM | POA: Diagnosis not present

## 2016-03-31 DIAGNOSIS — E02 Subclinical iodine-deficiency hypothyroidism: Secondary | ICD-10-CM | POA: Diagnosis not present

## 2016-03-31 DIAGNOSIS — Z741 Need for assistance with personal care: Secondary | ICD-10-CM | POA: Diagnosis not present

## 2016-03-31 DIAGNOSIS — M6281 Muscle weakness (generalized): Secondary | ICD-10-CM | POA: Diagnosis not present

## 2016-04-01 DIAGNOSIS — E02 Subclinical iodine-deficiency hypothyroidism: Secondary | ICD-10-CM | POA: Diagnosis not present

## 2016-04-01 DIAGNOSIS — I10 Essential (primary) hypertension: Secondary | ICD-10-CM | POA: Diagnosis not present

## 2016-04-01 DIAGNOSIS — F0390 Unspecified dementia without behavioral disturbance: Secondary | ICD-10-CM | POA: Diagnosis not present

## 2016-04-01 DIAGNOSIS — Z741 Need for assistance with personal care: Secondary | ICD-10-CM | POA: Diagnosis not present

## 2016-04-01 DIAGNOSIS — M545 Low back pain: Secondary | ICD-10-CM | POA: Diagnosis not present

## 2016-04-01 DIAGNOSIS — M6281 Muscle weakness (generalized): Secondary | ICD-10-CM | POA: Diagnosis not present

## 2016-04-04 DIAGNOSIS — M545 Low back pain: Secondary | ICD-10-CM | POA: Diagnosis not present

## 2016-04-04 DIAGNOSIS — M6281 Muscle weakness (generalized): Secondary | ICD-10-CM | POA: Diagnosis not present

## 2016-04-04 DIAGNOSIS — I10 Essential (primary) hypertension: Secondary | ICD-10-CM | POA: Diagnosis not present

## 2016-04-04 DIAGNOSIS — F0390 Unspecified dementia without behavioral disturbance: Secondary | ICD-10-CM | POA: Diagnosis not present

## 2016-04-04 DIAGNOSIS — Z741 Need for assistance with personal care: Secondary | ICD-10-CM | POA: Diagnosis not present

## 2016-04-04 DIAGNOSIS — E02 Subclinical iodine-deficiency hypothyroidism: Secondary | ICD-10-CM | POA: Diagnosis not present

## 2016-04-05 DIAGNOSIS — E02 Subclinical iodine-deficiency hypothyroidism: Secondary | ICD-10-CM | POA: Diagnosis not present

## 2016-04-05 DIAGNOSIS — M545 Low back pain: Secondary | ICD-10-CM | POA: Diagnosis not present

## 2016-04-05 DIAGNOSIS — I10 Essential (primary) hypertension: Secondary | ICD-10-CM | POA: Diagnosis not present

## 2016-04-05 DIAGNOSIS — Z741 Need for assistance with personal care: Secondary | ICD-10-CM | POA: Diagnosis not present

## 2016-04-05 DIAGNOSIS — M6281 Muscle weakness (generalized): Secondary | ICD-10-CM | POA: Diagnosis not present

## 2016-04-05 DIAGNOSIS — F0390 Unspecified dementia without behavioral disturbance: Secondary | ICD-10-CM | POA: Diagnosis not present

## 2016-04-06 DIAGNOSIS — Z741 Need for assistance with personal care: Secondary | ICD-10-CM | POA: Diagnosis not present

## 2016-04-06 DIAGNOSIS — M545 Low back pain: Secondary | ICD-10-CM | POA: Diagnosis not present

## 2016-04-06 DIAGNOSIS — E02 Subclinical iodine-deficiency hypothyroidism: Secondary | ICD-10-CM | POA: Diagnosis not present

## 2016-04-06 DIAGNOSIS — M6281 Muscle weakness (generalized): Secondary | ICD-10-CM | POA: Diagnosis not present

## 2016-04-06 DIAGNOSIS — I10 Essential (primary) hypertension: Secondary | ICD-10-CM | POA: Diagnosis not present

## 2016-04-06 DIAGNOSIS — F0390 Unspecified dementia without behavioral disturbance: Secondary | ICD-10-CM | POA: Diagnosis not present

## 2016-04-07 DIAGNOSIS — M545 Low back pain: Secondary | ICD-10-CM | POA: Diagnosis not present

## 2016-04-07 DIAGNOSIS — I10 Essential (primary) hypertension: Secondary | ICD-10-CM | POA: Diagnosis not present

## 2016-04-07 DIAGNOSIS — Z741 Need for assistance with personal care: Secondary | ICD-10-CM | POA: Diagnosis not present

## 2016-04-07 DIAGNOSIS — E02 Subclinical iodine-deficiency hypothyroidism: Secondary | ICD-10-CM | POA: Diagnosis not present

## 2016-04-07 DIAGNOSIS — M6281 Muscle weakness (generalized): Secondary | ICD-10-CM | POA: Diagnosis not present

## 2016-04-07 DIAGNOSIS — F0390 Unspecified dementia without behavioral disturbance: Secondary | ICD-10-CM | POA: Diagnosis not present

## 2016-04-08 DIAGNOSIS — M6281 Muscle weakness (generalized): Secondary | ICD-10-CM | POA: Diagnosis not present

## 2016-04-08 DIAGNOSIS — M545 Low back pain: Secondary | ICD-10-CM | POA: Diagnosis not present

## 2016-04-08 DIAGNOSIS — F0390 Unspecified dementia without behavioral disturbance: Secondary | ICD-10-CM | POA: Diagnosis not present

## 2016-04-08 DIAGNOSIS — Z741 Need for assistance with personal care: Secondary | ICD-10-CM | POA: Diagnosis not present

## 2016-04-08 DIAGNOSIS — E02 Subclinical iodine-deficiency hypothyroidism: Secondary | ICD-10-CM | POA: Diagnosis not present

## 2016-04-08 DIAGNOSIS — I10 Essential (primary) hypertension: Secondary | ICD-10-CM | POA: Diagnosis not present

## 2016-04-09 DIAGNOSIS — Z741 Need for assistance with personal care: Secondary | ICD-10-CM | POA: Diagnosis not present

## 2016-04-09 DIAGNOSIS — I10 Essential (primary) hypertension: Secondary | ICD-10-CM | POA: Diagnosis not present

## 2016-04-09 DIAGNOSIS — M6281 Muscle weakness (generalized): Secondary | ICD-10-CM | POA: Diagnosis not present

## 2016-04-09 DIAGNOSIS — F0390 Unspecified dementia without behavioral disturbance: Secondary | ICD-10-CM | POA: Diagnosis not present

## 2016-04-09 DIAGNOSIS — M545 Low back pain: Secondary | ICD-10-CM | POA: Diagnosis not present

## 2016-04-09 DIAGNOSIS — E02 Subclinical iodine-deficiency hypothyroidism: Secondary | ICD-10-CM | POA: Diagnosis not present

## 2016-04-11 DIAGNOSIS — F0390 Unspecified dementia without behavioral disturbance: Secondary | ICD-10-CM | POA: Diagnosis not present

## 2016-04-11 DIAGNOSIS — I10 Essential (primary) hypertension: Secondary | ICD-10-CM | POA: Diagnosis not present

## 2016-04-11 DIAGNOSIS — M545 Low back pain: Secondary | ICD-10-CM | POA: Diagnosis not present

## 2016-04-11 DIAGNOSIS — E02 Subclinical iodine-deficiency hypothyroidism: Secondary | ICD-10-CM | POA: Diagnosis not present

## 2016-04-11 DIAGNOSIS — Z741 Need for assistance with personal care: Secondary | ICD-10-CM | POA: Diagnosis not present

## 2016-04-11 DIAGNOSIS — M6281 Muscle weakness (generalized): Secondary | ICD-10-CM | POA: Diagnosis not present

## 2016-04-12 DIAGNOSIS — E02 Subclinical iodine-deficiency hypothyroidism: Secondary | ICD-10-CM | POA: Diagnosis not present

## 2016-04-12 DIAGNOSIS — I10 Essential (primary) hypertension: Secondary | ICD-10-CM | POA: Diagnosis not present

## 2016-04-12 DIAGNOSIS — M545 Low back pain: Secondary | ICD-10-CM | POA: Diagnosis not present

## 2016-04-12 DIAGNOSIS — F0390 Unspecified dementia without behavioral disturbance: Secondary | ICD-10-CM | POA: Diagnosis not present

## 2016-04-12 DIAGNOSIS — Z741 Need for assistance with personal care: Secondary | ICD-10-CM | POA: Diagnosis not present

## 2016-04-12 DIAGNOSIS — M6281 Muscle weakness (generalized): Secondary | ICD-10-CM | POA: Diagnosis not present

## 2016-04-13 DIAGNOSIS — I10 Essential (primary) hypertension: Secondary | ICD-10-CM | POA: Diagnosis not present

## 2016-04-13 DIAGNOSIS — M545 Low back pain: Secondary | ICD-10-CM | POA: Diagnosis not present

## 2016-04-13 DIAGNOSIS — M6281 Muscle weakness (generalized): Secondary | ICD-10-CM | POA: Diagnosis not present

## 2016-04-13 DIAGNOSIS — Z741 Need for assistance with personal care: Secondary | ICD-10-CM | POA: Diagnosis not present

## 2016-04-13 DIAGNOSIS — F0390 Unspecified dementia without behavioral disturbance: Secondary | ICD-10-CM | POA: Diagnosis not present

## 2016-04-13 DIAGNOSIS — E02 Subclinical iodine-deficiency hypothyroidism: Secondary | ICD-10-CM | POA: Diagnosis not present

## 2016-04-14 DIAGNOSIS — F0390 Unspecified dementia without behavioral disturbance: Secondary | ICD-10-CM | POA: Diagnosis not present

## 2016-04-14 DIAGNOSIS — Z741 Need for assistance with personal care: Secondary | ICD-10-CM | POA: Diagnosis not present

## 2016-04-14 DIAGNOSIS — E02 Subclinical iodine-deficiency hypothyroidism: Secondary | ICD-10-CM | POA: Diagnosis not present

## 2016-04-14 DIAGNOSIS — I10 Essential (primary) hypertension: Secondary | ICD-10-CM | POA: Diagnosis not present

## 2016-04-14 DIAGNOSIS — M6281 Muscle weakness (generalized): Secondary | ICD-10-CM | POA: Diagnosis not present

## 2016-04-14 DIAGNOSIS — M545 Low back pain: Secondary | ICD-10-CM | POA: Diagnosis not present

## 2016-04-15 DIAGNOSIS — M545 Low back pain: Secondary | ICD-10-CM | POA: Diagnosis not present

## 2016-04-15 DIAGNOSIS — I10 Essential (primary) hypertension: Secondary | ICD-10-CM | POA: Diagnosis not present

## 2016-04-15 DIAGNOSIS — Z741 Need for assistance with personal care: Secondary | ICD-10-CM | POA: Diagnosis not present

## 2016-04-15 DIAGNOSIS — F0390 Unspecified dementia without behavioral disturbance: Secondary | ICD-10-CM | POA: Diagnosis not present

## 2016-04-15 DIAGNOSIS — M6281 Muscle weakness (generalized): Secondary | ICD-10-CM | POA: Diagnosis not present

## 2016-04-15 DIAGNOSIS — E02 Subclinical iodine-deficiency hypothyroidism: Secondary | ICD-10-CM | POA: Diagnosis not present

## 2016-04-18 DIAGNOSIS — I10 Essential (primary) hypertension: Secondary | ICD-10-CM | POA: Diagnosis not present

## 2016-04-18 DIAGNOSIS — E02 Subclinical iodine-deficiency hypothyroidism: Secondary | ICD-10-CM | POA: Diagnosis not present

## 2016-04-18 DIAGNOSIS — M545 Low back pain: Secondary | ICD-10-CM | POA: Diagnosis not present

## 2016-04-18 DIAGNOSIS — M6281 Muscle weakness (generalized): Secondary | ICD-10-CM | POA: Diagnosis not present

## 2016-04-18 DIAGNOSIS — Z741 Need for assistance with personal care: Secondary | ICD-10-CM | POA: Diagnosis not present

## 2016-04-18 DIAGNOSIS — F0390 Unspecified dementia without behavioral disturbance: Secondary | ICD-10-CM | POA: Diagnosis not present

## 2016-04-19 DIAGNOSIS — E02 Subclinical iodine-deficiency hypothyroidism: Secondary | ICD-10-CM | POA: Diagnosis not present

## 2016-04-19 DIAGNOSIS — F0390 Unspecified dementia without behavioral disturbance: Secondary | ICD-10-CM | POA: Diagnosis not present

## 2016-04-19 DIAGNOSIS — M545 Low back pain: Secondary | ICD-10-CM | POA: Diagnosis not present

## 2016-04-19 DIAGNOSIS — Z741 Need for assistance with personal care: Secondary | ICD-10-CM | POA: Diagnosis not present

## 2016-04-19 DIAGNOSIS — I10 Essential (primary) hypertension: Secondary | ICD-10-CM | POA: Diagnosis not present

## 2016-04-19 DIAGNOSIS — M6281 Muscle weakness (generalized): Secondary | ICD-10-CM | POA: Diagnosis not present

## 2016-04-20 DIAGNOSIS — E02 Subclinical iodine-deficiency hypothyroidism: Secondary | ICD-10-CM | POA: Diagnosis not present

## 2016-04-20 DIAGNOSIS — Z741 Need for assistance with personal care: Secondary | ICD-10-CM | POA: Diagnosis not present

## 2016-04-20 DIAGNOSIS — I10 Essential (primary) hypertension: Secondary | ICD-10-CM | POA: Diagnosis not present

## 2016-04-20 DIAGNOSIS — M6281 Muscle weakness (generalized): Secondary | ICD-10-CM | POA: Diagnosis not present

## 2016-04-20 DIAGNOSIS — M545 Low back pain: Secondary | ICD-10-CM | POA: Diagnosis not present

## 2016-04-20 DIAGNOSIS — F0390 Unspecified dementia without behavioral disturbance: Secondary | ICD-10-CM | POA: Diagnosis not present

## 2016-04-21 DIAGNOSIS — R488 Other symbolic dysfunctions: Secondary | ICD-10-CM | POA: Diagnosis not present

## 2016-04-21 DIAGNOSIS — Z741 Need for assistance with personal care: Secondary | ICD-10-CM | POA: Diagnosis not present

## 2016-04-21 DIAGNOSIS — F0390 Unspecified dementia without behavioral disturbance: Secondary | ICD-10-CM | POA: Diagnosis not present

## 2016-04-21 DIAGNOSIS — M545 Low back pain: Secondary | ICD-10-CM | POA: Diagnosis not present

## 2016-04-21 DIAGNOSIS — E02 Subclinical iodine-deficiency hypothyroidism: Secondary | ICD-10-CM | POA: Diagnosis not present

## 2016-04-21 DIAGNOSIS — M6281 Muscle weakness (generalized): Secondary | ICD-10-CM | POA: Diagnosis not present

## 2016-04-21 DIAGNOSIS — I10 Essential (primary) hypertension: Secondary | ICD-10-CM | POA: Diagnosis not present

## 2016-04-22 DIAGNOSIS — R488 Other symbolic dysfunctions: Secondary | ICD-10-CM | POA: Diagnosis not present

## 2016-04-22 DIAGNOSIS — I10 Essential (primary) hypertension: Secondary | ICD-10-CM | POA: Diagnosis not present

## 2016-04-22 DIAGNOSIS — E02 Subclinical iodine-deficiency hypothyroidism: Secondary | ICD-10-CM | POA: Diagnosis not present

## 2016-04-22 DIAGNOSIS — Z741 Need for assistance with personal care: Secondary | ICD-10-CM | POA: Diagnosis not present

## 2016-04-22 DIAGNOSIS — F0390 Unspecified dementia without behavioral disturbance: Secondary | ICD-10-CM | POA: Diagnosis not present

## 2016-04-22 DIAGNOSIS — M6281 Muscle weakness (generalized): Secondary | ICD-10-CM | POA: Diagnosis not present

## 2016-04-24 DIAGNOSIS — Z741 Need for assistance with personal care: Secondary | ICD-10-CM | POA: Diagnosis not present

## 2016-04-24 DIAGNOSIS — I10 Essential (primary) hypertension: Secondary | ICD-10-CM | POA: Diagnosis not present

## 2016-04-24 DIAGNOSIS — M6281 Muscle weakness (generalized): Secondary | ICD-10-CM | POA: Diagnosis not present

## 2016-04-24 DIAGNOSIS — R488 Other symbolic dysfunctions: Secondary | ICD-10-CM | POA: Diagnosis not present

## 2016-04-24 DIAGNOSIS — F0390 Unspecified dementia without behavioral disturbance: Secondary | ICD-10-CM | POA: Diagnosis not present

## 2016-04-24 DIAGNOSIS — E02 Subclinical iodine-deficiency hypothyroidism: Secondary | ICD-10-CM | POA: Diagnosis not present

## 2016-04-25 DIAGNOSIS — E02 Subclinical iodine-deficiency hypothyroidism: Secondary | ICD-10-CM | POA: Diagnosis not present

## 2016-04-25 DIAGNOSIS — Z741 Need for assistance with personal care: Secondary | ICD-10-CM | POA: Diagnosis not present

## 2016-04-25 DIAGNOSIS — M6281 Muscle weakness (generalized): Secondary | ICD-10-CM | POA: Diagnosis not present

## 2016-04-25 DIAGNOSIS — R488 Other symbolic dysfunctions: Secondary | ICD-10-CM | POA: Diagnosis not present

## 2016-04-25 DIAGNOSIS — F0390 Unspecified dementia without behavioral disturbance: Secondary | ICD-10-CM | POA: Diagnosis not present

## 2016-04-25 DIAGNOSIS — I10 Essential (primary) hypertension: Secondary | ICD-10-CM | POA: Diagnosis not present

## 2016-04-26 DIAGNOSIS — Z741 Need for assistance with personal care: Secondary | ICD-10-CM | POA: Diagnosis not present

## 2016-04-26 DIAGNOSIS — I10 Essential (primary) hypertension: Secondary | ICD-10-CM | POA: Diagnosis not present

## 2016-04-26 DIAGNOSIS — E02 Subclinical iodine-deficiency hypothyroidism: Secondary | ICD-10-CM | POA: Diagnosis not present

## 2016-04-26 DIAGNOSIS — R488 Other symbolic dysfunctions: Secondary | ICD-10-CM | POA: Diagnosis not present

## 2016-04-26 DIAGNOSIS — M6281 Muscle weakness (generalized): Secondary | ICD-10-CM | POA: Diagnosis not present

## 2016-04-26 DIAGNOSIS — F0390 Unspecified dementia without behavioral disturbance: Secondary | ICD-10-CM | POA: Diagnosis not present

## 2016-04-27 DIAGNOSIS — R488 Other symbolic dysfunctions: Secondary | ICD-10-CM | POA: Diagnosis not present

## 2016-04-27 DIAGNOSIS — F0390 Unspecified dementia without behavioral disturbance: Secondary | ICD-10-CM | POA: Diagnosis not present

## 2016-04-27 DIAGNOSIS — M6281 Muscle weakness (generalized): Secondary | ICD-10-CM | POA: Diagnosis not present

## 2016-04-27 DIAGNOSIS — Z741 Need for assistance with personal care: Secondary | ICD-10-CM | POA: Diagnosis not present

## 2016-04-27 DIAGNOSIS — E02 Subclinical iodine-deficiency hypothyroidism: Secondary | ICD-10-CM | POA: Diagnosis not present

## 2016-04-27 DIAGNOSIS — I10 Essential (primary) hypertension: Secondary | ICD-10-CM | POA: Diagnosis not present

## 2016-04-28 DIAGNOSIS — E02 Subclinical iodine-deficiency hypothyroidism: Secondary | ICD-10-CM | POA: Diagnosis not present

## 2016-04-28 DIAGNOSIS — R488 Other symbolic dysfunctions: Secondary | ICD-10-CM | POA: Diagnosis not present

## 2016-04-28 DIAGNOSIS — F0390 Unspecified dementia without behavioral disturbance: Secondary | ICD-10-CM | POA: Diagnosis not present

## 2016-04-28 DIAGNOSIS — M6281 Muscle weakness (generalized): Secondary | ICD-10-CM | POA: Diagnosis not present

## 2016-04-28 DIAGNOSIS — I10 Essential (primary) hypertension: Secondary | ICD-10-CM | POA: Diagnosis not present

## 2016-04-28 DIAGNOSIS — Z741 Need for assistance with personal care: Secondary | ICD-10-CM | POA: Diagnosis not present

## 2016-04-29 DIAGNOSIS — Z741 Need for assistance with personal care: Secondary | ICD-10-CM | POA: Diagnosis not present

## 2016-04-29 DIAGNOSIS — F0390 Unspecified dementia without behavioral disturbance: Secondary | ICD-10-CM | POA: Diagnosis not present

## 2016-04-29 DIAGNOSIS — M6281 Muscle weakness (generalized): Secondary | ICD-10-CM | POA: Diagnosis not present

## 2016-04-29 DIAGNOSIS — E02 Subclinical iodine-deficiency hypothyroidism: Secondary | ICD-10-CM | POA: Diagnosis not present

## 2016-04-29 DIAGNOSIS — I10 Essential (primary) hypertension: Secondary | ICD-10-CM | POA: Diagnosis not present

## 2016-04-29 DIAGNOSIS — R488 Other symbolic dysfunctions: Secondary | ICD-10-CM | POA: Diagnosis not present

## 2016-05-01 DIAGNOSIS — Z741 Need for assistance with personal care: Secondary | ICD-10-CM | POA: Diagnosis not present

## 2016-05-01 DIAGNOSIS — F0390 Unspecified dementia without behavioral disturbance: Secondary | ICD-10-CM | POA: Diagnosis not present

## 2016-05-01 DIAGNOSIS — R488 Other symbolic dysfunctions: Secondary | ICD-10-CM | POA: Diagnosis not present

## 2016-05-01 DIAGNOSIS — E02 Subclinical iodine-deficiency hypothyroidism: Secondary | ICD-10-CM | POA: Diagnosis not present

## 2016-05-01 DIAGNOSIS — I10 Essential (primary) hypertension: Secondary | ICD-10-CM | POA: Diagnosis not present

## 2016-05-01 DIAGNOSIS — M6281 Muscle weakness (generalized): Secondary | ICD-10-CM | POA: Diagnosis not present

## 2016-05-03 DIAGNOSIS — I10 Essential (primary) hypertension: Secondary | ICD-10-CM | POA: Diagnosis not present

## 2016-05-03 DIAGNOSIS — Z741 Need for assistance with personal care: Secondary | ICD-10-CM | POA: Diagnosis not present

## 2016-05-03 DIAGNOSIS — R488 Other symbolic dysfunctions: Secondary | ICD-10-CM | POA: Diagnosis not present

## 2016-05-03 DIAGNOSIS — E02 Subclinical iodine-deficiency hypothyroidism: Secondary | ICD-10-CM | POA: Diagnosis not present

## 2016-05-03 DIAGNOSIS — M6281 Muscle weakness (generalized): Secondary | ICD-10-CM | POA: Diagnosis not present

## 2016-05-03 DIAGNOSIS — F0390 Unspecified dementia without behavioral disturbance: Secondary | ICD-10-CM | POA: Diagnosis not present

## 2016-05-04 DIAGNOSIS — R488 Other symbolic dysfunctions: Secondary | ICD-10-CM | POA: Diagnosis not present

## 2016-05-04 DIAGNOSIS — E02 Subclinical iodine-deficiency hypothyroidism: Secondary | ICD-10-CM | POA: Diagnosis not present

## 2016-05-04 DIAGNOSIS — F0390 Unspecified dementia without behavioral disturbance: Secondary | ICD-10-CM | POA: Diagnosis not present

## 2016-05-04 DIAGNOSIS — Z741 Need for assistance with personal care: Secondary | ICD-10-CM | POA: Diagnosis not present

## 2016-05-04 DIAGNOSIS — M6281 Muscle weakness (generalized): Secondary | ICD-10-CM | POA: Diagnosis not present

## 2016-05-04 DIAGNOSIS — I10 Essential (primary) hypertension: Secondary | ICD-10-CM | POA: Diagnosis not present

## 2016-05-05 DIAGNOSIS — R488 Other symbolic dysfunctions: Secondary | ICD-10-CM | POA: Diagnosis not present

## 2016-05-05 DIAGNOSIS — F0390 Unspecified dementia without behavioral disturbance: Secondary | ICD-10-CM | POA: Diagnosis not present

## 2016-05-05 DIAGNOSIS — Z741 Need for assistance with personal care: Secondary | ICD-10-CM | POA: Diagnosis not present

## 2016-05-05 DIAGNOSIS — M6281 Muscle weakness (generalized): Secondary | ICD-10-CM | POA: Diagnosis not present

## 2016-05-05 DIAGNOSIS — I10 Essential (primary) hypertension: Secondary | ICD-10-CM | POA: Diagnosis not present

## 2016-05-05 DIAGNOSIS — E02 Subclinical iodine-deficiency hypothyroidism: Secondary | ICD-10-CM | POA: Diagnosis not present

## 2016-05-06 DIAGNOSIS — R488 Other symbolic dysfunctions: Secondary | ICD-10-CM | POA: Diagnosis not present

## 2016-05-06 DIAGNOSIS — F0390 Unspecified dementia without behavioral disturbance: Secondary | ICD-10-CM | POA: Diagnosis not present

## 2016-05-06 DIAGNOSIS — I10 Essential (primary) hypertension: Secondary | ICD-10-CM | POA: Diagnosis not present

## 2016-05-06 DIAGNOSIS — E02 Subclinical iodine-deficiency hypothyroidism: Secondary | ICD-10-CM | POA: Diagnosis not present

## 2016-05-06 DIAGNOSIS — M6281 Muscle weakness (generalized): Secondary | ICD-10-CM | POA: Diagnosis not present

## 2016-05-06 DIAGNOSIS — Z741 Need for assistance with personal care: Secondary | ICD-10-CM | POA: Diagnosis not present

## 2016-05-09 DIAGNOSIS — M6281 Muscle weakness (generalized): Secondary | ICD-10-CM | POA: Diagnosis not present

## 2016-05-09 DIAGNOSIS — Z741 Need for assistance with personal care: Secondary | ICD-10-CM | POA: Diagnosis not present

## 2016-05-09 DIAGNOSIS — F0390 Unspecified dementia without behavioral disturbance: Secondary | ICD-10-CM | POA: Diagnosis not present

## 2016-05-09 DIAGNOSIS — E02 Subclinical iodine-deficiency hypothyroidism: Secondary | ICD-10-CM | POA: Diagnosis not present

## 2016-05-09 DIAGNOSIS — I10 Essential (primary) hypertension: Secondary | ICD-10-CM | POA: Diagnosis not present

## 2016-05-09 DIAGNOSIS — R488 Other symbolic dysfunctions: Secondary | ICD-10-CM | POA: Diagnosis not present

## 2016-05-10 DIAGNOSIS — Z741 Need for assistance with personal care: Secondary | ICD-10-CM | POA: Diagnosis not present

## 2016-05-10 DIAGNOSIS — I10 Essential (primary) hypertension: Secondary | ICD-10-CM | POA: Diagnosis not present

## 2016-05-10 DIAGNOSIS — E02 Subclinical iodine-deficiency hypothyroidism: Secondary | ICD-10-CM | POA: Diagnosis not present

## 2016-05-10 DIAGNOSIS — R488 Other symbolic dysfunctions: Secondary | ICD-10-CM | POA: Diagnosis not present

## 2016-05-10 DIAGNOSIS — M6281 Muscle weakness (generalized): Secondary | ICD-10-CM | POA: Diagnosis not present

## 2016-05-10 DIAGNOSIS — F0390 Unspecified dementia without behavioral disturbance: Secondary | ICD-10-CM | POA: Diagnosis not present

## 2016-05-11 DIAGNOSIS — Z741 Need for assistance with personal care: Secondary | ICD-10-CM | POA: Diagnosis not present

## 2016-05-11 DIAGNOSIS — F0390 Unspecified dementia without behavioral disturbance: Secondary | ICD-10-CM | POA: Diagnosis not present

## 2016-05-11 DIAGNOSIS — E02 Subclinical iodine-deficiency hypothyroidism: Secondary | ICD-10-CM | POA: Diagnosis not present

## 2016-05-11 DIAGNOSIS — R488 Other symbolic dysfunctions: Secondary | ICD-10-CM | POA: Diagnosis not present

## 2016-05-11 DIAGNOSIS — M6281 Muscle weakness (generalized): Secondary | ICD-10-CM | POA: Diagnosis not present

## 2016-05-11 DIAGNOSIS — I10 Essential (primary) hypertension: Secondary | ICD-10-CM | POA: Diagnosis not present

## 2016-05-12 DIAGNOSIS — M6281 Muscle weakness (generalized): Secondary | ICD-10-CM | POA: Diagnosis not present

## 2016-05-12 DIAGNOSIS — R488 Other symbolic dysfunctions: Secondary | ICD-10-CM | POA: Diagnosis not present

## 2016-05-12 DIAGNOSIS — E02 Subclinical iodine-deficiency hypothyroidism: Secondary | ICD-10-CM | POA: Diagnosis not present

## 2016-05-12 DIAGNOSIS — F0390 Unspecified dementia without behavioral disturbance: Secondary | ICD-10-CM | POA: Diagnosis not present

## 2016-05-12 DIAGNOSIS — Z741 Need for assistance with personal care: Secondary | ICD-10-CM | POA: Diagnosis not present

## 2016-05-12 DIAGNOSIS — I10 Essential (primary) hypertension: Secondary | ICD-10-CM | POA: Diagnosis not present

## 2016-05-13 DIAGNOSIS — M79672 Pain in left foot: Secondary | ICD-10-CM | POA: Diagnosis not present

## 2016-05-13 DIAGNOSIS — R488 Other symbolic dysfunctions: Secondary | ICD-10-CM | POA: Diagnosis not present

## 2016-05-13 DIAGNOSIS — F0390 Unspecified dementia without behavioral disturbance: Secondary | ICD-10-CM | POA: Diagnosis not present

## 2016-05-13 DIAGNOSIS — I10 Essential (primary) hypertension: Secondary | ICD-10-CM | POA: Diagnosis not present

## 2016-05-13 DIAGNOSIS — E02 Subclinical iodine-deficiency hypothyroidism: Secondary | ICD-10-CM | POA: Diagnosis not present

## 2016-05-13 DIAGNOSIS — B351 Tinea unguium: Secondary | ICD-10-CM | POA: Diagnosis not present

## 2016-05-13 DIAGNOSIS — R262 Difficulty in walking, not elsewhere classified: Secondary | ICD-10-CM | POA: Diagnosis not present

## 2016-05-13 DIAGNOSIS — Z741 Need for assistance with personal care: Secondary | ICD-10-CM | POA: Diagnosis not present

## 2016-05-13 DIAGNOSIS — M79671 Pain in right foot: Secondary | ICD-10-CM | POA: Diagnosis not present

## 2016-05-13 DIAGNOSIS — M6281 Muscle weakness (generalized): Secondary | ICD-10-CM | POA: Diagnosis not present

## 2016-05-15 DIAGNOSIS — E02 Subclinical iodine-deficiency hypothyroidism: Secondary | ICD-10-CM | POA: Diagnosis not present

## 2016-05-15 DIAGNOSIS — F0390 Unspecified dementia without behavioral disturbance: Secondary | ICD-10-CM | POA: Diagnosis not present

## 2016-05-15 DIAGNOSIS — Z741 Need for assistance with personal care: Secondary | ICD-10-CM | POA: Diagnosis not present

## 2016-05-15 DIAGNOSIS — I10 Essential (primary) hypertension: Secondary | ICD-10-CM | POA: Diagnosis not present

## 2016-05-15 DIAGNOSIS — M6281 Muscle weakness (generalized): Secondary | ICD-10-CM | POA: Diagnosis not present

## 2016-05-15 DIAGNOSIS — R488 Other symbolic dysfunctions: Secondary | ICD-10-CM | POA: Diagnosis not present

## 2016-05-16 DIAGNOSIS — R488 Other symbolic dysfunctions: Secondary | ICD-10-CM | POA: Diagnosis not present

## 2016-05-16 DIAGNOSIS — I10 Essential (primary) hypertension: Secondary | ICD-10-CM | POA: Diagnosis not present

## 2016-05-16 DIAGNOSIS — E02 Subclinical iodine-deficiency hypothyroidism: Secondary | ICD-10-CM | POA: Diagnosis not present

## 2016-05-16 DIAGNOSIS — Z741 Need for assistance with personal care: Secondary | ICD-10-CM | POA: Diagnosis not present

## 2016-05-16 DIAGNOSIS — M6281 Muscle weakness (generalized): Secondary | ICD-10-CM | POA: Diagnosis not present

## 2016-05-16 DIAGNOSIS — F0390 Unspecified dementia without behavioral disturbance: Secondary | ICD-10-CM | POA: Diagnosis not present

## 2016-05-17 DIAGNOSIS — Z741 Need for assistance with personal care: Secondary | ICD-10-CM | POA: Diagnosis not present

## 2016-05-17 DIAGNOSIS — F0390 Unspecified dementia without behavioral disturbance: Secondary | ICD-10-CM | POA: Diagnosis not present

## 2016-05-17 DIAGNOSIS — M6281 Muscle weakness (generalized): Secondary | ICD-10-CM | POA: Diagnosis not present

## 2016-05-17 DIAGNOSIS — I10 Essential (primary) hypertension: Secondary | ICD-10-CM | POA: Diagnosis not present

## 2016-05-17 DIAGNOSIS — E02 Subclinical iodine-deficiency hypothyroidism: Secondary | ICD-10-CM | POA: Diagnosis not present

## 2016-05-17 DIAGNOSIS — R488 Other symbolic dysfunctions: Secondary | ICD-10-CM | POA: Diagnosis not present

## 2016-05-18 DIAGNOSIS — R488 Other symbolic dysfunctions: Secondary | ICD-10-CM | POA: Diagnosis not present

## 2016-05-18 DIAGNOSIS — I10 Essential (primary) hypertension: Secondary | ICD-10-CM | POA: Diagnosis not present

## 2016-05-18 DIAGNOSIS — E02 Subclinical iodine-deficiency hypothyroidism: Secondary | ICD-10-CM | POA: Diagnosis not present

## 2016-05-18 DIAGNOSIS — M6281 Muscle weakness (generalized): Secondary | ICD-10-CM | POA: Diagnosis not present

## 2016-05-18 DIAGNOSIS — Z741 Need for assistance with personal care: Secondary | ICD-10-CM | POA: Diagnosis not present

## 2016-05-18 DIAGNOSIS — F0390 Unspecified dementia without behavioral disturbance: Secondary | ICD-10-CM | POA: Diagnosis not present

## 2016-05-19 DIAGNOSIS — E02 Subclinical iodine-deficiency hypothyroidism: Secondary | ICD-10-CM | POA: Diagnosis not present

## 2016-05-19 DIAGNOSIS — F0390 Unspecified dementia without behavioral disturbance: Secondary | ICD-10-CM | POA: Diagnosis not present

## 2016-05-19 DIAGNOSIS — Z741 Need for assistance with personal care: Secondary | ICD-10-CM | POA: Diagnosis not present

## 2016-05-19 DIAGNOSIS — M6281 Muscle weakness (generalized): Secondary | ICD-10-CM | POA: Diagnosis not present

## 2016-05-19 DIAGNOSIS — R488 Other symbolic dysfunctions: Secondary | ICD-10-CM | POA: Diagnosis not present

## 2016-05-19 DIAGNOSIS — I10 Essential (primary) hypertension: Secondary | ICD-10-CM | POA: Diagnosis not present

## 2016-05-20 DIAGNOSIS — R488 Other symbolic dysfunctions: Secondary | ICD-10-CM | POA: Diagnosis not present

## 2016-05-20 DIAGNOSIS — M6281 Muscle weakness (generalized): Secondary | ICD-10-CM | POA: Diagnosis not present

## 2016-05-20 DIAGNOSIS — Z741 Need for assistance with personal care: Secondary | ICD-10-CM | POA: Diagnosis not present

## 2016-05-20 DIAGNOSIS — E02 Subclinical iodine-deficiency hypothyroidism: Secondary | ICD-10-CM | POA: Diagnosis not present

## 2016-05-20 DIAGNOSIS — I10 Essential (primary) hypertension: Secondary | ICD-10-CM | POA: Diagnosis not present

## 2016-05-20 DIAGNOSIS — F0390 Unspecified dementia without behavioral disturbance: Secondary | ICD-10-CM | POA: Diagnosis not present

## 2016-05-23 DIAGNOSIS — R488 Other symbolic dysfunctions: Secondary | ICD-10-CM | POA: Diagnosis not present

## 2016-05-23 DIAGNOSIS — M6281 Muscle weakness (generalized): Secondary | ICD-10-CM | POA: Diagnosis not present

## 2016-05-23 DIAGNOSIS — F0390 Unspecified dementia without behavioral disturbance: Secondary | ICD-10-CM | POA: Diagnosis not present

## 2016-05-23 DIAGNOSIS — Z741 Need for assistance with personal care: Secondary | ICD-10-CM | POA: Diagnosis not present

## 2016-05-24 DIAGNOSIS — R488 Other symbolic dysfunctions: Secondary | ICD-10-CM | POA: Diagnosis not present

## 2016-05-24 DIAGNOSIS — M6281 Muscle weakness (generalized): Secondary | ICD-10-CM | POA: Diagnosis not present

## 2016-05-24 DIAGNOSIS — Z741 Need for assistance with personal care: Secondary | ICD-10-CM | POA: Diagnosis not present

## 2016-05-24 DIAGNOSIS — F0390 Unspecified dementia without behavioral disturbance: Secondary | ICD-10-CM | POA: Diagnosis not present

## 2016-05-25 DIAGNOSIS — R488 Other symbolic dysfunctions: Secondary | ICD-10-CM | POA: Diagnosis not present

## 2016-05-25 DIAGNOSIS — F0390 Unspecified dementia without behavioral disturbance: Secondary | ICD-10-CM | POA: Diagnosis not present

## 2016-05-25 DIAGNOSIS — Z741 Need for assistance with personal care: Secondary | ICD-10-CM | POA: Diagnosis not present

## 2016-05-25 DIAGNOSIS — M6281 Muscle weakness (generalized): Secondary | ICD-10-CM | POA: Diagnosis not present

## 2016-05-26 DIAGNOSIS — R488 Other symbolic dysfunctions: Secondary | ICD-10-CM | POA: Diagnosis not present

## 2016-05-26 DIAGNOSIS — M6281 Muscle weakness (generalized): Secondary | ICD-10-CM | POA: Diagnosis not present

## 2016-05-26 DIAGNOSIS — F0390 Unspecified dementia without behavioral disturbance: Secondary | ICD-10-CM | POA: Diagnosis not present

## 2016-05-26 DIAGNOSIS — Z741 Need for assistance with personal care: Secondary | ICD-10-CM | POA: Diagnosis not present

## 2016-05-27 DIAGNOSIS — R488 Other symbolic dysfunctions: Secondary | ICD-10-CM | POA: Diagnosis not present

## 2016-05-27 DIAGNOSIS — M6281 Muscle weakness (generalized): Secondary | ICD-10-CM | POA: Diagnosis not present

## 2016-05-27 DIAGNOSIS — Z741 Need for assistance with personal care: Secondary | ICD-10-CM | POA: Diagnosis not present

## 2016-05-27 DIAGNOSIS — F0390 Unspecified dementia without behavioral disturbance: Secondary | ICD-10-CM | POA: Diagnosis not present

## 2016-05-28 DIAGNOSIS — M6281 Muscle weakness (generalized): Secondary | ICD-10-CM | POA: Diagnosis not present

## 2016-05-28 DIAGNOSIS — Z741 Need for assistance with personal care: Secondary | ICD-10-CM | POA: Diagnosis not present

## 2016-05-28 DIAGNOSIS — R488 Other symbolic dysfunctions: Secondary | ICD-10-CM | POA: Diagnosis not present

## 2016-05-28 DIAGNOSIS — F0390 Unspecified dementia without behavioral disturbance: Secondary | ICD-10-CM | POA: Diagnosis not present

## 2016-05-29 DIAGNOSIS — Z741 Need for assistance with personal care: Secondary | ICD-10-CM | POA: Diagnosis not present

## 2016-05-29 DIAGNOSIS — R488 Other symbolic dysfunctions: Secondary | ICD-10-CM | POA: Diagnosis not present

## 2016-05-29 DIAGNOSIS — F0390 Unspecified dementia without behavioral disturbance: Secondary | ICD-10-CM | POA: Diagnosis not present

## 2016-05-29 DIAGNOSIS — M6281 Muscle weakness (generalized): Secondary | ICD-10-CM | POA: Diagnosis not present

## 2016-05-30 DIAGNOSIS — M6281 Muscle weakness (generalized): Secondary | ICD-10-CM | POA: Diagnosis not present

## 2016-05-30 DIAGNOSIS — Z741 Need for assistance with personal care: Secondary | ICD-10-CM | POA: Diagnosis not present

## 2016-05-30 DIAGNOSIS — R488 Other symbolic dysfunctions: Secondary | ICD-10-CM | POA: Diagnosis not present

## 2016-05-30 DIAGNOSIS — F0390 Unspecified dementia without behavioral disturbance: Secondary | ICD-10-CM | POA: Diagnosis not present

## 2016-05-31 DIAGNOSIS — R488 Other symbolic dysfunctions: Secondary | ICD-10-CM | POA: Diagnosis not present

## 2016-05-31 DIAGNOSIS — F0390 Unspecified dementia without behavioral disturbance: Secondary | ICD-10-CM | POA: Diagnosis not present

## 2016-05-31 DIAGNOSIS — Z741 Need for assistance with personal care: Secondary | ICD-10-CM | POA: Diagnosis not present

## 2016-05-31 DIAGNOSIS — M6281 Muscle weakness (generalized): Secondary | ICD-10-CM | POA: Diagnosis not present

## 2016-06-01 DIAGNOSIS — M6281 Muscle weakness (generalized): Secondary | ICD-10-CM | POA: Diagnosis not present

## 2016-06-01 DIAGNOSIS — Z741 Need for assistance with personal care: Secondary | ICD-10-CM | POA: Diagnosis not present

## 2016-06-01 DIAGNOSIS — R488 Other symbolic dysfunctions: Secondary | ICD-10-CM | POA: Diagnosis not present

## 2016-06-01 DIAGNOSIS — F0390 Unspecified dementia without behavioral disturbance: Secondary | ICD-10-CM | POA: Diagnosis not present

## 2016-06-02 DIAGNOSIS — F0390 Unspecified dementia without behavioral disturbance: Secondary | ICD-10-CM | POA: Diagnosis not present

## 2016-06-02 DIAGNOSIS — Z741 Need for assistance with personal care: Secondary | ICD-10-CM | POA: Diagnosis not present

## 2016-06-02 DIAGNOSIS — R488 Other symbolic dysfunctions: Secondary | ICD-10-CM | POA: Diagnosis not present

## 2016-06-02 DIAGNOSIS — M6281 Muscle weakness (generalized): Secondary | ICD-10-CM | POA: Diagnosis not present

## 2016-06-05 DIAGNOSIS — F0390 Unspecified dementia without behavioral disturbance: Secondary | ICD-10-CM | POA: Diagnosis not present

## 2016-06-05 DIAGNOSIS — Z741 Need for assistance with personal care: Secondary | ICD-10-CM | POA: Diagnosis not present

## 2016-06-05 DIAGNOSIS — R488 Other symbolic dysfunctions: Secondary | ICD-10-CM | POA: Diagnosis not present

## 2016-06-05 DIAGNOSIS — M6281 Muscle weakness (generalized): Secondary | ICD-10-CM | POA: Diagnosis not present

## 2016-06-07 DIAGNOSIS — Z741 Need for assistance with personal care: Secondary | ICD-10-CM | POA: Diagnosis not present

## 2016-06-07 DIAGNOSIS — M6281 Muscle weakness (generalized): Secondary | ICD-10-CM | POA: Diagnosis not present

## 2016-06-07 DIAGNOSIS — F0390 Unspecified dementia without behavioral disturbance: Secondary | ICD-10-CM | POA: Diagnosis not present

## 2016-06-07 DIAGNOSIS — R488 Other symbolic dysfunctions: Secondary | ICD-10-CM | POA: Diagnosis not present

## 2016-06-08 DIAGNOSIS — M6281 Muscle weakness (generalized): Secondary | ICD-10-CM | POA: Diagnosis not present

## 2016-06-08 DIAGNOSIS — F0390 Unspecified dementia without behavioral disturbance: Secondary | ICD-10-CM | POA: Diagnosis not present

## 2016-06-08 DIAGNOSIS — Z741 Need for assistance with personal care: Secondary | ICD-10-CM | POA: Diagnosis not present

## 2016-06-08 DIAGNOSIS — R488 Other symbolic dysfunctions: Secondary | ICD-10-CM | POA: Diagnosis not present

## 2016-06-09 DIAGNOSIS — R488 Other symbolic dysfunctions: Secondary | ICD-10-CM | POA: Diagnosis not present

## 2016-06-09 DIAGNOSIS — Z741 Need for assistance with personal care: Secondary | ICD-10-CM | POA: Diagnosis not present

## 2016-06-09 DIAGNOSIS — F0390 Unspecified dementia without behavioral disturbance: Secondary | ICD-10-CM | POA: Diagnosis not present

## 2016-06-09 DIAGNOSIS — M6281 Muscle weakness (generalized): Secondary | ICD-10-CM | POA: Diagnosis not present

## 2016-06-10 DIAGNOSIS — M6281 Muscle weakness (generalized): Secondary | ICD-10-CM | POA: Diagnosis not present

## 2016-06-10 DIAGNOSIS — F0390 Unspecified dementia without behavioral disturbance: Secondary | ICD-10-CM | POA: Diagnosis not present

## 2016-06-10 DIAGNOSIS — Z741 Need for assistance with personal care: Secondary | ICD-10-CM | POA: Diagnosis not present

## 2016-06-10 DIAGNOSIS — R488 Other symbolic dysfunctions: Secondary | ICD-10-CM | POA: Diagnosis not present

## 2016-06-13 DIAGNOSIS — M6281 Muscle weakness (generalized): Secondary | ICD-10-CM | POA: Diagnosis not present

## 2016-06-13 DIAGNOSIS — R488 Other symbolic dysfunctions: Secondary | ICD-10-CM | POA: Diagnosis not present

## 2016-06-13 DIAGNOSIS — Z741 Need for assistance with personal care: Secondary | ICD-10-CM | POA: Diagnosis not present

## 2016-06-13 DIAGNOSIS — F0390 Unspecified dementia without behavioral disturbance: Secondary | ICD-10-CM | POA: Diagnosis not present

## 2016-06-14 ENCOUNTER — Ambulatory Visit (INDEPENDENT_AMBULATORY_CARE_PROVIDER_SITE_OTHER): Payer: Medicare Other | Admitting: Primary Care

## 2016-06-14 ENCOUNTER — Telehealth: Payer: Self-pay

## 2016-06-14 VITALS — BP 116/70 | HR 68 | Temp 98.4°F | Ht 61.0 in | Wt 225.8 lb

## 2016-06-14 DIAGNOSIS — I10 Essential (primary) hypertension: Secondary | ICD-10-CM | POA: Diagnosis not present

## 2016-06-14 DIAGNOSIS — F0390 Unspecified dementia without behavioral disturbance: Secondary | ICD-10-CM

## 2016-06-14 DIAGNOSIS — R600 Localized edema: Secondary | ICD-10-CM | POA: Diagnosis not present

## 2016-06-14 DIAGNOSIS — Z741 Need for assistance with personal care: Secondary | ICD-10-CM | POA: Diagnosis not present

## 2016-06-14 DIAGNOSIS — M6281 Muscle weakness (generalized): Secondary | ICD-10-CM | POA: Diagnosis not present

## 2016-06-14 DIAGNOSIS — R488 Other symbolic dysfunctions: Secondary | ICD-10-CM | POA: Diagnosis not present

## 2016-06-14 MED ORDER — VALSARTAN 320 MG PO TABS: 320.0000 mg | ORAL_TABLET | Freq: Every day | ORAL | 0 refills | Status: DC

## 2016-06-14 NOTE — Assessment & Plan Note (Signed)
Stable in clinic today and also on prior visits. Suspect amlodipine may be contributing to lower extremity edema, will remove as she is on 5 mg. New prescription provided for valsartan 320 mg. Continue metoprolol.  Discussed new instructions with CNA, she verbalized understanding.

## 2016-06-14 NOTE — Assessment & Plan Note (Signed)
Unsure of duration of swelling and unsure if swelling is worse than normal. HPI difficulty to obtain as patient and CNA unsure of symptoms.  Moderate edema noted to bilateral lower extremities, trace pitting. Low suspicion for DVT. Renal function slightly decreased, patient sedentary and does not elevate extremities.  Amlodipine/valsartan could also be contributing, so this was removed from her regimen and replaced with Valsartan 320 mg. Discussed to elevate extremities. Rx for compression hose provided today with instructions for use.  Assisted living to continue monitoring BP and will report frequent readings at or above 150/90 to PCP. BP today stable at 116/70.

## 2016-06-14 NOTE — Patient Instructions (Addendum)
Stop taking amlodipine-valsartan 5/320 mg tablets for high blood pressure as the amlodipine could be contributing to the swelling in your ankles and legs.  Start taking valsartan 320 mg tablets for high blood pressure. Take 1 tablet by mouth once daily. I sent this prescription to your pharmacy.  Continue taking Metoprolol 100 mg twice daily for high blood pressure.  You must elevate your legs when resting as this will help to reduce fluid in your legs.  Wear the compression hose to help reduce swelling. This will help to push fluid back up to the heart.  Please monitor her blood pressure and notify Dr. Milinda Antis if her readings consistently read at or above 150/90.  It was a pleasure meeting you!

## 2016-06-14 NOTE — Progress Notes (Signed)
Subjective:    Patient ID: Margaret Hicks, female    DOB: [birth year], 82 y.o.   MRN: [id number]  HPI  Margaret Hicks is an 82 year old female with a history of essential hypertension, impaired renal function, senile dementia, and lower extremity edema who presents today with a chief complaint of lower extremity edema. She is currently managed on spironolactone and amlodipine-valsartan.   Her swelling is located to her bilateral lower extremities. She is here with a CNA from assisted living who dose not regularly care for her. Neither the patient or the CNA are unsure of how long her swelling has been present as this is a chronic issue for her. The patient is unable to answer whether or not the swelling is worse, but the CNA reports someone has noticed an increase in swelling. The patient is sedentary throughout the day and does not wear compression stockings.  Denies calf pain and erythema, shortness of breath, cough, she is a non smoker. Her blood pressure in the office today is 116/70.  Review of Systems  Constitutional: Negative for chills and fever.  Respiratory: Negative for cough and shortness of breath.   Cardiovascular: Positive for leg swelling. Negative for chest pain.  Neurological: Negative for dizziness and weakness.       Past Medical History:  Diagnosis Date  . Acute gastritis   . Arthritis   . Back pain   . Dementia   . Esophageal reflux   . GERD (gastroesophageal reflux disease)   . Hemorrhoids   . Hyperlipemia   . Hypertension   . Hypothyroidism   . Obesity   . OP (osteoporosis)   . Renal insufficiency   . Schatzki's ring   . TIA (transient ischemic attack)   . Upper GI bleed   . Urinary incontinence      Social History   Social History  . Marital status: Widowed    Spouse name: N/A  . Number of children: N/A  . Years of education: N/A   Occupational History  . Not on file.   Social History Main Topics  . Smoking status: Never Smoker  . Smokeless  tobacco: Never Used  . Alcohol use No  . Drug use: No  . Sexual activity: Not on file   Other Topics Concern  . Not on file   Social History Narrative  . No narrative on file    Past Surgical History:  Procedure Laterality Date  . ABDOMINAL HYSTERECTOMY    . CARDIAC CATHETERIZATION    . CHOLECYSTECTOMY    . TONSILLECTOMY      Family History  Problem Relation Age of Onset  . Dementia Mother   . Stroke Mother   . Arthritis Sister   . Dementia Brother   . Cancer      nephew  . Colon cancer Neg Hx   . Heart disease Father   . Dementia Brother     Allergies  Allergen Reactions  . Simvastatin     REACTION: muscle pain/ memory issues    Current Outpatient Prescriptions on File Prior to Visit  Medication Sig Dispense Refill  . acetaminophen (TYLENOL) 500 MG tablet Take 500 mg by mouth every 8 (eight) hours as needed for moderate pain or headache.    . cholecalciferol (VITAMIN D) 1000 UNITS tablet Take 1,000 Units by mouth every evening.     . lansoprazole (PREVACID) 15 MG capsule Take 1 capsule (15 mg total) by mouth 2 (two) times daily before a  meal. 60 capsule 11  . levothyroxine (SYNTHROID, LEVOTHROID) 100 MCG tablet Take 1 tablet (100 mcg total) by mouth daily before breakfast. 90 tablet 3  . metoprolol (LOPRESSOR) 100 MG tablet Take 1 tablet (100 mg total) by mouth 2 (two) times daily. 180 tablet 3  . Multiple Vitamin (MULTIVITAMIN) capsule Take 1 capsule by mouth daily.      . sertraline (ZOLOFT) 50 MG tablet TAKE 1 TABLET (50 MG TOTAL) BY MOUTH DAILY. 90 tablet 3  . spironolactone (ALDACTONE) 25 MG tablet Take 0.5 tablets (12.5 mg total) by mouth daily. 45 tablet 3   No current facility-administered medications on file prior to visit.     BP 116/70 (BP Location: Left Arm, Patient Position: Sitting, Cuff Size: Large)   Pulse 68   Temp 98.4 F (36.9 C) (Oral)   Ht  (1.549 m)   Wt 225 lb 12.8 oz (102.4 kg)   SpO2 95%   BMI 42.66 kg/m    Objective:    Physical Exam  Constitutional: She appears well-nourished. She does not appear ill.  Cardiovascular: Normal rate and regular rhythm.   Moderate lower extremity edema bilaterally. Trace pitting. Mild ankle edema. Ambulatory in clinic with walker.   Pulmonary/Chest: Effort normal and breath sounds normal.  Skin: Skin is warm and dry. No erythema.          Assessment & Plan:

## 2016-06-14 NOTE — Assessment & Plan Note (Signed)
Pleasant, unable to contribute much to HPI today.

## 2016-06-14 NOTE — Progress Notes (Signed)
Pre visit review using our clinic review tool, if applicable. No additional management support is needed unless otherwise documented below in the visit note. 

## 2016-06-14 NOTE — Telephone Encounter (Signed)
Margaret Hicks with [facility] left v/m; what is compression for compression hose or do you want pt to have TED hose; if compression hose do you want 20:30 or 15:20. Margaret Hicks request cb.

## 2016-06-15 DIAGNOSIS — M6281 Muscle weakness (generalized): Secondary | ICD-10-CM | POA: Diagnosis not present

## 2016-06-15 DIAGNOSIS — R488 Other symbolic dysfunctions: Secondary | ICD-10-CM | POA: Diagnosis not present

## 2016-06-15 DIAGNOSIS — Z741 Need for assistance with personal care: Secondary | ICD-10-CM | POA: Diagnosis not present

## 2016-06-15 DIAGNOSIS — F0390 Unspecified dementia without behavioral disturbance: Secondary | ICD-10-CM | POA: Diagnosis not present

## 2016-06-15 NOTE — Telephone Encounter (Signed)
TED hose are fine, please call pharmacy.

## 2016-06-15 NOTE — Telephone Encounter (Signed)
Spoken to Gentry and him of Kate's comments.

## 2016-06-16 DIAGNOSIS — Z741 Need for assistance with personal care: Secondary | ICD-10-CM | POA: Diagnosis not present

## 2016-06-16 DIAGNOSIS — F0390 Unspecified dementia without behavioral disturbance: Secondary | ICD-10-CM | POA: Diagnosis not present

## 2016-06-16 DIAGNOSIS — M6281 Muscle weakness (generalized): Secondary | ICD-10-CM | POA: Diagnosis not present

## 2016-06-16 DIAGNOSIS — R488 Other symbolic dysfunctions: Secondary | ICD-10-CM | POA: Diagnosis not present

## 2016-06-17 DIAGNOSIS — Z741 Need for assistance with personal care: Secondary | ICD-10-CM | POA: Diagnosis not present

## 2016-06-17 DIAGNOSIS — F0390 Unspecified dementia without behavioral disturbance: Secondary | ICD-10-CM | POA: Diagnosis not present

## 2016-06-17 DIAGNOSIS — M6281 Muscle weakness (generalized): Secondary | ICD-10-CM | POA: Diagnosis not present

## 2016-06-17 DIAGNOSIS — R488 Other symbolic dysfunctions: Secondary | ICD-10-CM | POA: Diagnosis not present

## 2016-06-18 DIAGNOSIS — Z741 Need for assistance with personal care: Secondary | ICD-10-CM | POA: Diagnosis not present

## 2016-06-18 DIAGNOSIS — R488 Other symbolic dysfunctions: Secondary | ICD-10-CM | POA: Diagnosis not present

## 2016-06-18 DIAGNOSIS — F0390 Unspecified dementia without behavioral disturbance: Secondary | ICD-10-CM | POA: Diagnosis not present

## 2016-06-18 DIAGNOSIS — M6281 Muscle weakness (generalized): Secondary | ICD-10-CM | POA: Diagnosis not present

## 2016-06-20 DIAGNOSIS — R488 Other symbolic dysfunctions: Secondary | ICD-10-CM | POA: Diagnosis not present

## 2016-06-20 DIAGNOSIS — Z741 Need for assistance with personal care: Secondary | ICD-10-CM | POA: Diagnosis not present

## 2016-06-20 DIAGNOSIS — F0390 Unspecified dementia without behavioral disturbance: Secondary | ICD-10-CM | POA: Diagnosis not present

## 2016-06-20 DIAGNOSIS — M6281 Muscle weakness (generalized): Secondary | ICD-10-CM | POA: Diagnosis not present

## 2016-06-21 DIAGNOSIS — Z741 Need for assistance with personal care: Secondary | ICD-10-CM | POA: Diagnosis not present

## 2016-06-21 DIAGNOSIS — M6281 Muscle weakness (generalized): Secondary | ICD-10-CM | POA: Diagnosis not present

## 2016-06-21 DIAGNOSIS — F0390 Unspecified dementia without behavioral disturbance: Secondary | ICD-10-CM | POA: Diagnosis not present

## 2016-06-21 DIAGNOSIS — R488 Other symbolic dysfunctions: Secondary | ICD-10-CM | POA: Diagnosis not present

## 2016-06-22 DIAGNOSIS — M6281 Muscle weakness (generalized): Secondary | ICD-10-CM | POA: Diagnosis not present

## 2016-06-22 DIAGNOSIS — Z741 Need for assistance with personal care: Secondary | ICD-10-CM | POA: Diagnosis not present

## 2016-06-22 DIAGNOSIS — R488 Other symbolic dysfunctions: Secondary | ICD-10-CM | POA: Diagnosis not present

## 2016-06-22 DIAGNOSIS — F0390 Unspecified dementia without behavioral disturbance: Secondary | ICD-10-CM | POA: Diagnosis not present

## 2016-06-23 DIAGNOSIS — M6281 Muscle weakness (generalized): Secondary | ICD-10-CM | POA: Diagnosis not present

## 2016-06-23 DIAGNOSIS — Z741 Need for assistance with personal care: Secondary | ICD-10-CM | POA: Diagnosis not present

## 2016-06-23 DIAGNOSIS — R488 Other symbolic dysfunctions: Secondary | ICD-10-CM | POA: Diagnosis not present

## 2016-06-23 DIAGNOSIS — F0390 Unspecified dementia without behavioral disturbance: Secondary | ICD-10-CM | POA: Diagnosis not present

## 2016-06-24 DIAGNOSIS — F0390 Unspecified dementia without behavioral disturbance: Secondary | ICD-10-CM | POA: Diagnosis not present

## 2016-06-24 DIAGNOSIS — Z741 Need for assistance with personal care: Secondary | ICD-10-CM | POA: Diagnosis not present

## 2016-06-24 DIAGNOSIS — R488 Other symbolic dysfunctions: Secondary | ICD-10-CM | POA: Diagnosis not present

## 2016-06-24 DIAGNOSIS — M6281 Muscle weakness (generalized): Secondary | ICD-10-CM | POA: Diagnosis not present

## 2016-06-27 DIAGNOSIS — F0390 Unspecified dementia without behavioral disturbance: Secondary | ICD-10-CM | POA: Diagnosis not present

## 2016-06-27 DIAGNOSIS — Z741 Need for assistance with personal care: Secondary | ICD-10-CM | POA: Diagnosis not present

## 2016-06-27 DIAGNOSIS — M6281 Muscle weakness (generalized): Secondary | ICD-10-CM | POA: Diagnosis not present

## 2016-06-27 DIAGNOSIS — R488 Other symbolic dysfunctions: Secondary | ICD-10-CM | POA: Diagnosis not present

## 2016-06-28 DIAGNOSIS — R488 Other symbolic dysfunctions: Secondary | ICD-10-CM | POA: Diagnosis not present

## 2016-06-28 DIAGNOSIS — Z741 Need for assistance with personal care: Secondary | ICD-10-CM | POA: Diagnosis not present

## 2016-06-28 DIAGNOSIS — M6281 Muscle weakness (generalized): Secondary | ICD-10-CM | POA: Diagnosis not present

## 2016-06-28 DIAGNOSIS — F0390 Unspecified dementia without behavioral disturbance: Secondary | ICD-10-CM | POA: Diagnosis not present

## 2016-06-29 DIAGNOSIS — Z741 Need for assistance with personal care: Secondary | ICD-10-CM | POA: Diagnosis not present

## 2016-06-29 DIAGNOSIS — F0390 Unspecified dementia without behavioral disturbance: Secondary | ICD-10-CM | POA: Diagnosis not present

## 2016-06-29 DIAGNOSIS — R488 Other symbolic dysfunctions: Secondary | ICD-10-CM | POA: Diagnosis not present

## 2016-06-29 DIAGNOSIS — M6281 Muscle weakness (generalized): Secondary | ICD-10-CM | POA: Diagnosis not present

## 2016-06-30 DIAGNOSIS — Z741 Need for assistance with personal care: Secondary | ICD-10-CM | POA: Diagnosis not present

## 2016-06-30 DIAGNOSIS — M6281 Muscle weakness (generalized): Secondary | ICD-10-CM | POA: Diagnosis not present

## 2016-06-30 DIAGNOSIS — R488 Other symbolic dysfunctions: Secondary | ICD-10-CM | POA: Diagnosis not present

## 2016-06-30 DIAGNOSIS — F0390 Unspecified dementia without behavioral disturbance: Secondary | ICD-10-CM | POA: Diagnosis not present

## 2016-07-05 DIAGNOSIS — Z741 Need for assistance with personal care: Secondary | ICD-10-CM | POA: Diagnosis not present

## 2016-07-05 DIAGNOSIS — F0390 Unspecified dementia without behavioral disturbance: Secondary | ICD-10-CM | POA: Diagnosis not present

## 2016-07-05 DIAGNOSIS — R488 Other symbolic dysfunctions: Secondary | ICD-10-CM | POA: Diagnosis not present

## 2016-07-05 DIAGNOSIS — M6281 Muscle weakness (generalized): Secondary | ICD-10-CM | POA: Diagnosis not present

## 2016-07-06 DIAGNOSIS — R488 Other symbolic dysfunctions: Secondary | ICD-10-CM | POA: Diagnosis not present

## 2016-07-06 DIAGNOSIS — F0390 Unspecified dementia without behavioral disturbance: Secondary | ICD-10-CM | POA: Diagnosis not present

## 2016-07-06 DIAGNOSIS — M6281 Muscle weakness (generalized): Secondary | ICD-10-CM | POA: Diagnosis not present

## 2016-07-06 DIAGNOSIS — Z741 Need for assistance with personal care: Secondary | ICD-10-CM | POA: Diagnosis not present

## 2016-07-07 DIAGNOSIS — R488 Other symbolic dysfunctions: Secondary | ICD-10-CM | POA: Diagnosis not present

## 2016-07-07 DIAGNOSIS — M6281 Muscle weakness (generalized): Secondary | ICD-10-CM | POA: Diagnosis not present

## 2016-07-07 DIAGNOSIS — F0390 Unspecified dementia without behavioral disturbance: Secondary | ICD-10-CM | POA: Diagnosis not present

## 2016-07-07 DIAGNOSIS — Z741 Need for assistance with personal care: Secondary | ICD-10-CM | POA: Diagnosis not present

## 2016-07-08 DIAGNOSIS — Z741 Need for assistance with personal care: Secondary | ICD-10-CM | POA: Diagnosis not present

## 2016-07-08 DIAGNOSIS — M6281 Muscle weakness (generalized): Secondary | ICD-10-CM | POA: Diagnosis not present

## 2016-07-08 DIAGNOSIS — F0390 Unspecified dementia without behavioral disturbance: Secondary | ICD-10-CM | POA: Diagnosis not present

## 2016-07-08 DIAGNOSIS — R488 Other symbolic dysfunctions: Secondary | ICD-10-CM | POA: Diagnosis not present

## 2016-07-09 DIAGNOSIS — Z741 Need for assistance with personal care: Secondary | ICD-10-CM | POA: Diagnosis not present

## 2016-07-09 DIAGNOSIS — R488 Other symbolic dysfunctions: Secondary | ICD-10-CM | POA: Diagnosis not present

## 2016-07-09 DIAGNOSIS — M6281 Muscle weakness (generalized): Secondary | ICD-10-CM | POA: Diagnosis not present

## 2016-07-09 DIAGNOSIS — F0390 Unspecified dementia without behavioral disturbance: Secondary | ICD-10-CM | POA: Diagnosis not present

## 2016-07-10 DIAGNOSIS — R488 Other symbolic dysfunctions: Secondary | ICD-10-CM | POA: Diagnosis not present

## 2016-07-10 DIAGNOSIS — Z741 Need for assistance with personal care: Secondary | ICD-10-CM | POA: Diagnosis not present

## 2016-07-10 DIAGNOSIS — M6281 Muscle weakness (generalized): Secondary | ICD-10-CM | POA: Diagnosis not present

## 2016-07-10 DIAGNOSIS — F0390 Unspecified dementia without behavioral disturbance: Secondary | ICD-10-CM | POA: Diagnosis not present

## 2016-07-11 DIAGNOSIS — F0390 Unspecified dementia without behavioral disturbance: Secondary | ICD-10-CM | POA: Diagnosis not present

## 2016-07-11 DIAGNOSIS — R488 Other symbolic dysfunctions: Secondary | ICD-10-CM | POA: Diagnosis not present

## 2016-07-11 DIAGNOSIS — Z741 Need for assistance with personal care: Secondary | ICD-10-CM | POA: Diagnosis not present

## 2016-07-11 DIAGNOSIS — M6281 Muscle weakness (generalized): Secondary | ICD-10-CM | POA: Diagnosis not present

## 2016-07-13 DIAGNOSIS — Z741 Need for assistance with personal care: Secondary | ICD-10-CM | POA: Diagnosis not present

## 2016-07-13 DIAGNOSIS — M6281 Muscle weakness (generalized): Secondary | ICD-10-CM | POA: Diagnosis not present

## 2016-07-13 DIAGNOSIS — F0390 Unspecified dementia without behavioral disturbance: Secondary | ICD-10-CM | POA: Diagnosis not present

## 2016-07-13 DIAGNOSIS — R488 Other symbolic dysfunctions: Secondary | ICD-10-CM | POA: Diagnosis not present

## 2016-07-14 DIAGNOSIS — Z741 Need for assistance with personal care: Secondary | ICD-10-CM | POA: Diagnosis not present

## 2016-07-14 DIAGNOSIS — M6281 Muscle weakness (generalized): Secondary | ICD-10-CM | POA: Diagnosis not present

## 2016-07-14 DIAGNOSIS — F0390 Unspecified dementia without behavioral disturbance: Secondary | ICD-10-CM | POA: Diagnosis not present

## 2016-07-14 DIAGNOSIS — R488 Other symbolic dysfunctions: Secondary | ICD-10-CM | POA: Diagnosis not present

## 2016-07-15 DIAGNOSIS — Z741 Need for assistance with personal care: Secondary | ICD-10-CM | POA: Diagnosis not present

## 2016-07-15 DIAGNOSIS — M6281 Muscle weakness (generalized): Secondary | ICD-10-CM | POA: Diagnosis not present

## 2016-07-15 DIAGNOSIS — R488 Other symbolic dysfunctions: Secondary | ICD-10-CM | POA: Diagnosis not present

## 2016-07-15 DIAGNOSIS — F0390 Unspecified dementia without behavioral disturbance: Secondary | ICD-10-CM | POA: Diagnosis not present

## 2016-07-16 DIAGNOSIS — R488 Other symbolic dysfunctions: Secondary | ICD-10-CM | POA: Diagnosis not present

## 2016-07-16 DIAGNOSIS — M6281 Muscle weakness (generalized): Secondary | ICD-10-CM | POA: Diagnosis not present

## 2016-07-16 DIAGNOSIS — Z741 Need for assistance with personal care: Secondary | ICD-10-CM | POA: Diagnosis not present

## 2016-07-16 DIAGNOSIS — F0390 Unspecified dementia without behavioral disturbance: Secondary | ICD-10-CM | POA: Diagnosis not present

## 2016-07-18 DIAGNOSIS — F0390 Unspecified dementia without behavioral disturbance: Secondary | ICD-10-CM | POA: Diagnosis not present

## 2016-07-18 DIAGNOSIS — Z741 Need for assistance with personal care: Secondary | ICD-10-CM | POA: Diagnosis not present

## 2016-07-18 DIAGNOSIS — M6281 Muscle weakness (generalized): Secondary | ICD-10-CM | POA: Diagnosis not present

## 2016-07-18 DIAGNOSIS — R488 Other symbolic dysfunctions: Secondary | ICD-10-CM | POA: Diagnosis not present

## 2016-07-19 DIAGNOSIS — M6281 Muscle weakness (generalized): Secondary | ICD-10-CM | POA: Diagnosis not present

## 2016-07-19 DIAGNOSIS — R488 Other symbolic dysfunctions: Secondary | ICD-10-CM | POA: Diagnosis not present

## 2016-07-19 DIAGNOSIS — F0390 Unspecified dementia without behavioral disturbance: Secondary | ICD-10-CM | POA: Diagnosis not present

## 2016-07-19 DIAGNOSIS — Z741 Need for assistance with personal care: Secondary | ICD-10-CM | POA: Diagnosis not present

## 2016-07-20 DIAGNOSIS — F0390 Unspecified dementia without behavioral disturbance: Secondary | ICD-10-CM | POA: Diagnosis not present

## 2016-07-20 DIAGNOSIS — M6281 Muscle weakness (generalized): Secondary | ICD-10-CM | POA: Diagnosis not present

## 2016-07-20 DIAGNOSIS — Z741 Need for assistance with personal care: Secondary | ICD-10-CM | POA: Diagnosis not present

## 2016-07-20 DIAGNOSIS — R488 Other symbolic dysfunctions: Secondary | ICD-10-CM | POA: Diagnosis not present

## 2016-07-21 DIAGNOSIS — M6281 Muscle weakness (generalized): Secondary | ICD-10-CM | POA: Diagnosis not present

## 2016-07-21 DIAGNOSIS — Z741 Need for assistance with personal care: Secondary | ICD-10-CM | POA: Diagnosis not present

## 2016-07-21 DIAGNOSIS — F0390 Unspecified dementia without behavioral disturbance: Secondary | ICD-10-CM | POA: Diagnosis not present

## 2016-07-21 DIAGNOSIS — R488 Other symbolic dysfunctions: Secondary | ICD-10-CM | POA: Diagnosis not present

## 2016-07-22 DIAGNOSIS — M6281 Muscle weakness (generalized): Secondary | ICD-10-CM | POA: Diagnosis not present

## 2016-07-22 DIAGNOSIS — F0390 Unspecified dementia without behavioral disturbance: Secondary | ICD-10-CM | POA: Diagnosis not present

## 2016-07-22 DIAGNOSIS — Z741 Need for assistance with personal care: Secondary | ICD-10-CM | POA: Diagnosis not present

## 2016-07-22 DIAGNOSIS — R488 Other symbolic dysfunctions: Secondary | ICD-10-CM | POA: Diagnosis not present

## 2016-07-25 DIAGNOSIS — R488 Other symbolic dysfunctions: Secondary | ICD-10-CM | POA: Diagnosis not present

## 2016-07-25 DIAGNOSIS — Z741 Need for assistance with personal care: Secondary | ICD-10-CM | POA: Diagnosis not present

## 2016-07-25 DIAGNOSIS — M6281 Muscle weakness (generalized): Secondary | ICD-10-CM | POA: Diagnosis not present

## 2016-07-25 DIAGNOSIS — F0390 Unspecified dementia without behavioral disturbance: Secondary | ICD-10-CM | POA: Diagnosis not present

## 2016-07-26 DIAGNOSIS — R488 Other symbolic dysfunctions: Secondary | ICD-10-CM | POA: Diagnosis not present

## 2016-07-26 DIAGNOSIS — Z741 Need for assistance with personal care: Secondary | ICD-10-CM | POA: Diagnosis not present

## 2016-07-26 DIAGNOSIS — M6281 Muscle weakness (generalized): Secondary | ICD-10-CM | POA: Diagnosis not present

## 2016-07-26 DIAGNOSIS — F0390 Unspecified dementia without behavioral disturbance: Secondary | ICD-10-CM | POA: Diagnosis not present

## 2016-07-27 DIAGNOSIS — F0390 Unspecified dementia without behavioral disturbance: Secondary | ICD-10-CM | POA: Diagnosis not present

## 2016-07-27 DIAGNOSIS — Z741 Need for assistance with personal care: Secondary | ICD-10-CM | POA: Diagnosis not present

## 2016-07-27 DIAGNOSIS — M6281 Muscle weakness (generalized): Secondary | ICD-10-CM | POA: Diagnosis not present

## 2016-07-27 DIAGNOSIS — R488 Other symbolic dysfunctions: Secondary | ICD-10-CM | POA: Diagnosis not present

## 2016-07-28 DIAGNOSIS — M6281 Muscle weakness (generalized): Secondary | ICD-10-CM | POA: Diagnosis not present

## 2016-07-28 DIAGNOSIS — R488 Other symbolic dysfunctions: Secondary | ICD-10-CM | POA: Diagnosis not present

## 2016-07-28 DIAGNOSIS — Z741 Need for assistance with personal care: Secondary | ICD-10-CM | POA: Diagnosis not present

## 2016-07-28 DIAGNOSIS — F0390 Unspecified dementia without behavioral disturbance: Secondary | ICD-10-CM | POA: Diagnosis not present

## 2016-08-01 DIAGNOSIS — F0390 Unspecified dementia without behavioral disturbance: Secondary | ICD-10-CM | POA: Diagnosis not present

## 2016-08-01 DIAGNOSIS — R488 Other symbolic dysfunctions: Secondary | ICD-10-CM | POA: Diagnosis not present

## 2016-08-01 DIAGNOSIS — Z741 Need for assistance with personal care: Secondary | ICD-10-CM | POA: Diagnosis not present

## 2016-08-01 DIAGNOSIS — M6281 Muscle weakness (generalized): Secondary | ICD-10-CM | POA: Diagnosis not present

## 2016-08-04 DIAGNOSIS — R488 Other symbolic dysfunctions: Secondary | ICD-10-CM | POA: Diagnosis not present

## 2016-08-04 DIAGNOSIS — Z741 Need for assistance with personal care: Secondary | ICD-10-CM | POA: Diagnosis not present

## 2016-08-04 DIAGNOSIS — M6281 Muscle weakness (generalized): Secondary | ICD-10-CM | POA: Diagnosis not present

## 2016-08-04 DIAGNOSIS — F0390 Unspecified dementia without behavioral disturbance: Secondary | ICD-10-CM | POA: Diagnosis not present

## 2016-08-05 DIAGNOSIS — Z741 Need for assistance with personal care: Secondary | ICD-10-CM | POA: Diagnosis not present

## 2016-08-05 DIAGNOSIS — R488 Other symbolic dysfunctions: Secondary | ICD-10-CM | POA: Diagnosis not present

## 2016-08-05 DIAGNOSIS — F0390 Unspecified dementia without behavioral disturbance: Secondary | ICD-10-CM | POA: Diagnosis not present

## 2016-08-05 DIAGNOSIS — M6281 Muscle weakness (generalized): Secondary | ICD-10-CM | POA: Diagnosis not present

## 2016-08-08 DIAGNOSIS — Z741 Need for assistance with personal care: Secondary | ICD-10-CM | POA: Diagnosis not present

## 2016-08-08 DIAGNOSIS — F0390 Unspecified dementia without behavioral disturbance: Secondary | ICD-10-CM | POA: Diagnosis not present

## 2016-08-08 DIAGNOSIS — R488 Other symbolic dysfunctions: Secondary | ICD-10-CM | POA: Diagnosis not present

## 2016-08-08 DIAGNOSIS — M6281 Muscle weakness (generalized): Secondary | ICD-10-CM | POA: Diagnosis not present

## 2016-08-09 DIAGNOSIS — R488 Other symbolic dysfunctions: Secondary | ICD-10-CM | POA: Diagnosis not present

## 2016-08-09 DIAGNOSIS — Z741 Need for assistance with personal care: Secondary | ICD-10-CM | POA: Diagnosis not present

## 2016-08-09 DIAGNOSIS — F0390 Unspecified dementia without behavioral disturbance: Secondary | ICD-10-CM | POA: Diagnosis not present

## 2016-08-09 DIAGNOSIS — M6281 Muscle weakness (generalized): Secondary | ICD-10-CM | POA: Diagnosis not present

## 2016-08-10 DIAGNOSIS — Z741 Need for assistance with personal care: Secondary | ICD-10-CM | POA: Diagnosis not present

## 2016-08-10 DIAGNOSIS — F0390 Unspecified dementia without behavioral disturbance: Secondary | ICD-10-CM | POA: Diagnosis not present

## 2016-08-10 DIAGNOSIS — M6281 Muscle weakness (generalized): Secondary | ICD-10-CM | POA: Diagnosis not present

## 2016-08-10 DIAGNOSIS — R488 Other symbolic dysfunctions: Secondary | ICD-10-CM | POA: Diagnosis not present

## 2016-08-11 DIAGNOSIS — M6281 Muscle weakness (generalized): Secondary | ICD-10-CM | POA: Diagnosis not present

## 2016-08-11 DIAGNOSIS — F0390 Unspecified dementia without behavioral disturbance: Secondary | ICD-10-CM | POA: Diagnosis not present

## 2016-08-11 DIAGNOSIS — Z741 Need for assistance with personal care: Secondary | ICD-10-CM | POA: Diagnosis not present

## 2016-08-11 DIAGNOSIS — R488 Other symbolic dysfunctions: Secondary | ICD-10-CM | POA: Diagnosis not present

## 2016-08-15 DIAGNOSIS — F0390 Unspecified dementia without behavioral disturbance: Secondary | ICD-10-CM | POA: Diagnosis not present

## 2016-08-15 DIAGNOSIS — Z741 Need for assistance with personal care: Secondary | ICD-10-CM | POA: Diagnosis not present

## 2016-08-15 DIAGNOSIS — M6281 Muscle weakness (generalized): Secondary | ICD-10-CM | POA: Diagnosis not present

## 2016-08-15 DIAGNOSIS — R488 Other symbolic dysfunctions: Secondary | ICD-10-CM | POA: Diagnosis not present

## 2016-08-16 DIAGNOSIS — F0390 Unspecified dementia without behavioral disturbance: Secondary | ICD-10-CM | POA: Diagnosis not present

## 2016-08-16 DIAGNOSIS — Z741 Need for assistance with personal care: Secondary | ICD-10-CM | POA: Diagnosis not present

## 2016-08-16 DIAGNOSIS — R488 Other symbolic dysfunctions: Secondary | ICD-10-CM | POA: Diagnosis not present

## 2016-08-16 DIAGNOSIS — M6281 Muscle weakness (generalized): Secondary | ICD-10-CM | POA: Diagnosis not present

## 2016-08-17 DIAGNOSIS — M6281 Muscle weakness (generalized): Secondary | ICD-10-CM | POA: Diagnosis not present

## 2016-08-17 DIAGNOSIS — Z741 Need for assistance with personal care: Secondary | ICD-10-CM | POA: Diagnosis not present

## 2016-08-17 DIAGNOSIS — R488 Other symbolic dysfunctions: Secondary | ICD-10-CM | POA: Diagnosis not present

## 2016-08-17 DIAGNOSIS — F0390 Unspecified dementia without behavioral disturbance: Secondary | ICD-10-CM | POA: Diagnosis not present

## 2016-08-18 DIAGNOSIS — F0390 Unspecified dementia without behavioral disturbance: Secondary | ICD-10-CM | POA: Diagnosis not present

## 2016-08-18 DIAGNOSIS — M6281 Muscle weakness (generalized): Secondary | ICD-10-CM | POA: Diagnosis not present

## 2016-08-18 DIAGNOSIS — R488 Other symbolic dysfunctions: Secondary | ICD-10-CM | POA: Diagnosis not present

## 2016-08-18 DIAGNOSIS — Z741 Need for assistance with personal care: Secondary | ICD-10-CM | POA: Diagnosis not present

## 2016-08-19 DIAGNOSIS — M6281 Muscle weakness (generalized): Secondary | ICD-10-CM | POA: Diagnosis not present

## 2016-08-19 DIAGNOSIS — R488 Other symbolic dysfunctions: Secondary | ICD-10-CM | POA: Diagnosis not present

## 2016-08-19 DIAGNOSIS — Z741 Need for assistance with personal care: Secondary | ICD-10-CM | POA: Diagnosis not present

## 2016-08-19 DIAGNOSIS — F0390 Unspecified dementia without behavioral disturbance: Secondary | ICD-10-CM | POA: Diagnosis not present

## 2016-08-20 DIAGNOSIS — R488 Other symbolic dysfunctions: Secondary | ICD-10-CM | POA: Diagnosis not present

## 2016-08-20 DIAGNOSIS — Z741 Need for assistance with personal care: Secondary | ICD-10-CM | POA: Diagnosis not present

## 2016-08-20 DIAGNOSIS — F0390 Unspecified dementia without behavioral disturbance: Secondary | ICD-10-CM | POA: Diagnosis not present

## 2016-08-20 DIAGNOSIS — M6281 Muscle weakness (generalized): Secondary | ICD-10-CM | POA: Diagnosis not present

## 2016-08-22 DIAGNOSIS — F0390 Unspecified dementia without behavioral disturbance: Secondary | ICD-10-CM | POA: Diagnosis not present

## 2016-08-22 DIAGNOSIS — R488 Other symbolic dysfunctions: Secondary | ICD-10-CM | POA: Diagnosis not present

## 2016-08-22 DIAGNOSIS — Z741 Need for assistance with personal care: Secondary | ICD-10-CM | POA: Diagnosis not present

## 2016-08-22 DIAGNOSIS — M6281 Muscle weakness (generalized): Secondary | ICD-10-CM | POA: Diagnosis not present

## 2016-08-23 DIAGNOSIS — F0390 Unspecified dementia without behavioral disturbance: Secondary | ICD-10-CM | POA: Diagnosis not present

## 2016-08-23 DIAGNOSIS — Z741 Need for assistance with personal care: Secondary | ICD-10-CM | POA: Diagnosis not present

## 2016-08-23 DIAGNOSIS — M6281 Muscle weakness (generalized): Secondary | ICD-10-CM | POA: Diagnosis not present

## 2016-08-23 DIAGNOSIS — R488 Other symbolic dysfunctions: Secondary | ICD-10-CM | POA: Diagnosis not present

## 2016-08-24 ENCOUNTER — Telehealth: Payer: Self-pay | Admitting: Family Medicine

## 2016-08-24 DIAGNOSIS — F0390 Unspecified dementia without behavioral disturbance: Secondary | ICD-10-CM | POA: Diagnosis not present

## 2016-08-24 DIAGNOSIS — Z741 Need for assistance with personal care: Secondary | ICD-10-CM | POA: Diagnosis not present

## 2016-08-24 DIAGNOSIS — M6281 Muscle weakness (generalized): Secondary | ICD-10-CM | POA: Diagnosis not present

## 2016-08-24 DIAGNOSIS — R488 Other symbolic dysfunctions: Secondary | ICD-10-CM | POA: Diagnosis not present

## 2016-08-24 MED ORDER — NYSTATIN 100000 UNIT/GM EX POWD: Freq: Four times a day (QID) | CUTANEOUS | 2 refills | Status: DC

## 2016-08-24 MED ORDER — FLUCONAZOLE 150 MG PO TABS: 150.0000 mg | ORAL_TABLET | Freq: Once | ORAL | 0 refills | Status: AC

## 2016-08-24 NOTE — Telephone Encounter (Signed)
[facility] pt has redness/rash with odor under breast  Suspect intertrigo  Please call in nystatin powder (directions are for 4 times daily- just do the best they can regarding frequency of application -tid would be ok as well) Thanks Update if this does not help  Keep area as clean and dry as possible

## 2016-08-24 NOTE — Telephone Encounter (Signed)
Rxs sent to pharmacy [facility] and advise of Dr. Royden Purlower's comments. Pt's son notified Rxs sent and he will pick them up and give them to pt

## 2016-08-25 DIAGNOSIS — R488 Other symbolic dysfunctions: Secondary | ICD-10-CM | POA: Diagnosis not present

## 2016-08-25 DIAGNOSIS — Z741 Need for assistance with personal care: Secondary | ICD-10-CM | POA: Diagnosis not present

## 2016-08-25 DIAGNOSIS — M6281 Muscle weakness (generalized): Secondary | ICD-10-CM | POA: Diagnosis not present

## 2016-08-25 DIAGNOSIS — F0390 Unspecified dementia without behavioral disturbance: Secondary | ICD-10-CM | POA: Diagnosis not present

## 2016-08-26 DIAGNOSIS — F0390 Unspecified dementia without behavioral disturbance: Secondary | ICD-10-CM | POA: Diagnosis not present

## 2016-08-26 DIAGNOSIS — M6281 Muscle weakness (generalized): Secondary | ICD-10-CM | POA: Diagnosis not present

## 2016-08-26 DIAGNOSIS — R488 Other symbolic dysfunctions: Secondary | ICD-10-CM | POA: Diagnosis not present

## 2016-08-26 DIAGNOSIS — Z741 Need for assistance with personal care: Secondary | ICD-10-CM | POA: Diagnosis not present

## 2016-08-29 ENCOUNTER — Other Ambulatory Visit: Payer: Self-pay | Admitting: Family Medicine

## 2016-08-29 DIAGNOSIS — R488 Other symbolic dysfunctions: Secondary | ICD-10-CM | POA: Diagnosis not present

## 2016-08-29 DIAGNOSIS — Z741 Need for assistance with personal care: Secondary | ICD-10-CM | POA: Diagnosis not present

## 2016-08-29 DIAGNOSIS — M6281 Muscle weakness (generalized): Secondary | ICD-10-CM | POA: Diagnosis not present

## 2016-08-29 DIAGNOSIS — F0390 Unspecified dementia without behavioral disturbance: Secondary | ICD-10-CM | POA: Diagnosis not present

## 2016-08-30 DIAGNOSIS — R488 Other symbolic dysfunctions: Secondary | ICD-10-CM | POA: Diagnosis not present

## 2016-08-30 DIAGNOSIS — F0390 Unspecified dementia without behavioral disturbance: Secondary | ICD-10-CM | POA: Diagnosis not present

## 2016-08-30 DIAGNOSIS — M6281 Muscle weakness (generalized): Secondary | ICD-10-CM | POA: Diagnosis not present

## 2016-08-30 DIAGNOSIS — Z741 Need for assistance with personal care: Secondary | ICD-10-CM | POA: Diagnosis not present

## 2016-08-31 DIAGNOSIS — Z741 Need for assistance with personal care: Secondary | ICD-10-CM | POA: Diagnosis not present

## 2016-08-31 DIAGNOSIS — M6281 Muscle weakness (generalized): Secondary | ICD-10-CM | POA: Diagnosis not present

## 2016-08-31 DIAGNOSIS — R488 Other symbolic dysfunctions: Secondary | ICD-10-CM | POA: Diagnosis not present

## 2016-08-31 DIAGNOSIS — F0390 Unspecified dementia without behavioral disturbance: Secondary | ICD-10-CM | POA: Diagnosis not present

## 2016-09-01 DIAGNOSIS — F0390 Unspecified dementia without behavioral disturbance: Secondary | ICD-10-CM | POA: Diagnosis not present

## 2016-09-01 DIAGNOSIS — R488 Other symbolic dysfunctions: Secondary | ICD-10-CM | POA: Diagnosis not present

## 2016-09-01 DIAGNOSIS — M6281 Muscle weakness (generalized): Secondary | ICD-10-CM | POA: Diagnosis not present

## 2016-09-01 DIAGNOSIS — Z741 Need for assistance with personal care: Secondary | ICD-10-CM | POA: Diagnosis not present

## 2016-09-05 DIAGNOSIS — Z741 Need for assistance with personal care: Secondary | ICD-10-CM | POA: Diagnosis not present

## 2016-09-05 DIAGNOSIS — F0390 Unspecified dementia without behavioral disturbance: Secondary | ICD-10-CM | POA: Diagnosis not present

## 2016-09-05 DIAGNOSIS — M6281 Muscle weakness (generalized): Secondary | ICD-10-CM | POA: Diagnosis not present

## 2016-09-05 DIAGNOSIS — R488 Other symbolic dysfunctions: Secondary | ICD-10-CM | POA: Diagnosis not present

## 2016-09-06 DIAGNOSIS — R488 Other symbolic dysfunctions: Secondary | ICD-10-CM | POA: Diagnosis not present

## 2016-09-06 DIAGNOSIS — M6281 Muscle weakness (generalized): Secondary | ICD-10-CM | POA: Diagnosis not present

## 2016-09-06 DIAGNOSIS — Z741 Need for assistance with personal care: Secondary | ICD-10-CM | POA: Diagnosis not present

## 2016-09-06 DIAGNOSIS — F0390 Unspecified dementia without behavioral disturbance: Secondary | ICD-10-CM | POA: Diagnosis not present

## 2016-09-07 DIAGNOSIS — R488 Other symbolic dysfunctions: Secondary | ICD-10-CM | POA: Diagnosis not present

## 2016-09-07 DIAGNOSIS — Z741 Need for assistance with personal care: Secondary | ICD-10-CM | POA: Diagnosis not present

## 2016-09-07 DIAGNOSIS — F0390 Unspecified dementia without behavioral disturbance: Secondary | ICD-10-CM | POA: Diagnosis not present

## 2016-09-07 DIAGNOSIS — M6281 Muscle weakness (generalized): Secondary | ICD-10-CM | POA: Diagnosis not present

## 2016-09-08 DIAGNOSIS — Z741 Need for assistance with personal care: Secondary | ICD-10-CM | POA: Diagnosis not present

## 2016-09-08 DIAGNOSIS — M6281 Muscle weakness (generalized): Secondary | ICD-10-CM | POA: Diagnosis not present

## 2016-09-08 DIAGNOSIS — R488 Other symbolic dysfunctions: Secondary | ICD-10-CM | POA: Diagnosis not present

## 2016-09-08 DIAGNOSIS — F0390 Unspecified dementia without behavioral disturbance: Secondary | ICD-10-CM | POA: Diagnosis not present

## 2016-09-09 DIAGNOSIS — M6281 Muscle weakness (generalized): Secondary | ICD-10-CM | POA: Diagnosis not present

## 2016-09-09 DIAGNOSIS — R488 Other symbolic dysfunctions: Secondary | ICD-10-CM | POA: Diagnosis not present

## 2016-09-09 DIAGNOSIS — Z741 Need for assistance with personal care: Secondary | ICD-10-CM | POA: Diagnosis not present

## 2016-09-09 DIAGNOSIS — F0390 Unspecified dementia without behavioral disturbance: Secondary | ICD-10-CM | POA: Diagnosis not present

## 2016-09-11 ENCOUNTER — Telehealth: Payer: Self-pay | Admitting: Family Medicine

## 2016-09-11 DIAGNOSIS — E039 Hypothyroidism, unspecified: Secondary | ICD-10-CM

## 2016-09-11 DIAGNOSIS — I10 Essential (primary) hypertension: Secondary | ICD-10-CM

## 2016-09-11 DIAGNOSIS — E785 Hyperlipidemia, unspecified: Secondary | ICD-10-CM

## 2016-09-11 NOTE — Telephone Encounter (Signed)
-----   Message from Alvina Chouerri J Walsh sent at 09/07/2016  5:57 PM EDT ----- Regarding: Lab orders for Friday, 10.27.17  AWV lab orders, please.

## 2016-09-12 DIAGNOSIS — F0390 Unspecified dementia without behavioral disturbance: Secondary | ICD-10-CM | POA: Diagnosis not present

## 2016-09-12 DIAGNOSIS — M6281 Muscle weakness (generalized): Secondary | ICD-10-CM | POA: Diagnosis not present

## 2016-09-12 DIAGNOSIS — R488 Other symbolic dysfunctions: Secondary | ICD-10-CM | POA: Diagnosis not present

## 2016-09-12 DIAGNOSIS — Z741 Need for assistance with personal care: Secondary | ICD-10-CM | POA: Diagnosis not present

## 2016-09-13 DIAGNOSIS — M6281 Muscle weakness (generalized): Secondary | ICD-10-CM | POA: Diagnosis not present

## 2016-09-13 DIAGNOSIS — R488 Other symbolic dysfunctions: Secondary | ICD-10-CM | POA: Diagnosis not present

## 2016-09-13 DIAGNOSIS — Z741 Need for assistance with personal care: Secondary | ICD-10-CM | POA: Diagnosis not present

## 2016-09-13 DIAGNOSIS — F0390 Unspecified dementia without behavioral disturbance: Secondary | ICD-10-CM | POA: Diagnosis not present

## 2016-09-14 DIAGNOSIS — M6281 Muscle weakness (generalized): Secondary | ICD-10-CM | POA: Diagnosis not present

## 2016-09-14 DIAGNOSIS — Z741 Need for assistance with personal care: Secondary | ICD-10-CM | POA: Diagnosis not present

## 2016-09-14 DIAGNOSIS — F0390 Unspecified dementia without behavioral disturbance: Secondary | ICD-10-CM | POA: Diagnosis not present

## 2016-09-14 DIAGNOSIS — R488 Other symbolic dysfunctions: Secondary | ICD-10-CM | POA: Diagnosis not present

## 2016-09-15 DIAGNOSIS — R488 Other symbolic dysfunctions: Secondary | ICD-10-CM | POA: Diagnosis not present

## 2016-09-15 DIAGNOSIS — F0390 Unspecified dementia without behavioral disturbance: Secondary | ICD-10-CM | POA: Diagnosis not present

## 2016-09-15 DIAGNOSIS — Z741 Need for assistance with personal care: Secondary | ICD-10-CM | POA: Diagnosis not present

## 2016-09-15 DIAGNOSIS — M6281 Muscle weakness (generalized): Secondary | ICD-10-CM | POA: Diagnosis not present

## 2016-09-16 ENCOUNTER — Ambulatory Visit (INDEPENDENT_AMBULATORY_CARE_PROVIDER_SITE_OTHER): Payer: Medicare Other

## 2016-09-16 ENCOUNTER — Other Ambulatory Visit (INDEPENDENT_AMBULATORY_CARE_PROVIDER_SITE_OTHER): Payer: Medicare Other

## 2016-09-16 VITALS — BP 114/68 | HR 57 | Temp 98.0°F | Ht 60.25 in | Wt 230.0 lb

## 2016-09-16 DIAGNOSIS — M6281 Muscle weakness (generalized): Secondary | ICD-10-CM | POA: Diagnosis not present

## 2016-09-16 DIAGNOSIS — E039 Hypothyroidism, unspecified: Secondary | ICD-10-CM | POA: Diagnosis not present

## 2016-09-16 DIAGNOSIS — Z741 Need for assistance with personal care: Secondary | ICD-10-CM | POA: Diagnosis not present

## 2016-09-16 DIAGNOSIS — Z0000 Encounter for general adult medical examination without abnormal findings: Secondary | ICD-10-CM | POA: Diagnosis not present

## 2016-09-16 DIAGNOSIS — F0390 Unspecified dementia without behavioral disturbance: Secondary | ICD-10-CM | POA: Diagnosis not present

## 2016-09-16 DIAGNOSIS — I10 Essential (primary) hypertension: Secondary | ICD-10-CM

## 2016-09-16 DIAGNOSIS — E785 Hyperlipidemia, unspecified: Secondary | ICD-10-CM

## 2016-09-16 DIAGNOSIS — R488 Other symbolic dysfunctions: Secondary | ICD-10-CM | POA: Diagnosis not present

## 2016-09-16 LAB — CBC WITH DIFFERENTIAL/PLATELET
BASOS PCT: 0.5 % (ref 0.0–3.0)
Basophils Absolute: 0 10*3/uL (ref 0.0–0.1)
EOS PCT: 0.9 % (ref 0.0–5.0)
Eosinophils Absolute: 0.1 10*3/uL (ref 0.0–0.7)
HEMATOCRIT: 38.7 % (ref 36.0–46.0)
HEMOGLOBIN: 13.1 g/dL (ref 12.0–15.0)
LYMPHS PCT: 29.8 % (ref 12.0–46.0)
Lymphs Abs: 1.7 10*3/uL (ref 0.7–4.0)
MCHC: 33.7 g/dL (ref 30.0–36.0)
MCV: 83.5 fl (ref 78.0–100.0)
MONO ABS: 0.5 10*3/uL (ref 0.1–1.0)
MONOS PCT: 8.1 % (ref 3.0–12.0)
Neutro Abs: 3.4 10*3/uL (ref 1.4–7.7)
Neutrophils Relative %: 60.7 % (ref 43.0–77.0)
Platelets: 234 10*3/uL (ref 150.0–400.0)
RBC: 4.64 Mil/uL (ref 3.87–5.11)
RDW: 14.4 % (ref 11.5–15.5)
WBC: 5.7 10*3/uL (ref 4.0–10.5)

## 2016-09-16 LAB — COMPREHENSIVE METABOLIC PANEL
ALBUMIN: 4 g/dL (ref 3.5–5.2)
ALT: 12 U/L (ref 0–35)
AST: 15 U/L (ref 0–37)
Alkaline Phosphatase: 95 U/L (ref 39–117)
BUN: 26 mg/dL — AB (ref 6–23)
CHLORIDE: 103 meq/L (ref 96–112)
CO2: 29 mEq/L (ref 19–32)
CREATININE: 1.19 mg/dL (ref 0.40–1.20)
Calcium: 9.6 mg/dL (ref 8.4–10.5)
GFR: 46.09 mL/min — ABNORMAL LOW (ref >60.00)
Glucose, Bld: 118 mg/dL — ABNORMAL HIGH (ref 70–99)
Potassium: 4.3 mEq/L (ref 3.5–5.1)
SODIUM: 138 meq/L (ref 135–145)
Total Bilirubin: 0.5 mg/dL (ref 0.2–1.2)
Total Protein: 6.6 g/dL (ref 6.0–8.3)

## 2016-09-16 LAB — LIPID PANEL
CHOL/HDL RATIO: 4
CHOLESTEROL: 199 mg/dL (ref 0–200)
HDL: 47.8 mg/dL (ref >39.00)
NONHDL: 151.54
Triglycerides: 267 mg/dL — ABNORMAL HIGH (ref 0.0–149.0)
VLDL: 53.4 mg/dL — AB (ref 0.0–40.0)

## 2016-09-16 LAB — LDL CHOLESTEROL, DIRECT: LDL DIRECT: 126 mg/dL

## 2016-09-16 NOTE — Patient Instructions (Signed)
[name] , Thank you for taking time to come for your Medicare Wellness Visit. I appreciate your ongoing commitment to your health goals. Please review the following plan we discussed and let me know if I can assist you in the future.    This is a list of the screening recommended for you and due dates:  Health Maintenance  Topic Date Due  . Flu Shot  11/20/2016*  . Mammogram  11/20/2016*  . DEXA scan (bone density measurement)  11/20/2016*  . Tetanus Vaccine  12/23/2020*  . Shingles Vaccine  Addressed  . Pneumonia vaccines  Completed  *Topic was postponed. The date shown is not the original due date.   Preventive Care for Adults  A healthy lifestyle and preventive care can promote health and wellness. Preventive health guidelines for adults include the following key practices.  . A routine yearly physical is a good way to check with your health care provider about your health and preventive screening. It is a chance to share any concerns and updates on your health and to receive a thorough exam.  . Visit your dentist for a routine exam and preventive care every 6 months. Brush your teeth twice a day and floss once a day. Good oral hygiene prevents tooth decay and gum disease.  . The frequency of eye exams is based on your age, health, family medical history, use  of contact lenses, and other factors. Follow your health care provider's ecommendations for frequency of eye exams.  . Eat a healthy diet. Foods like vegetables, fruits, whole grains, low-fat dairy products, and lean protein foods contain the nutrients you need without too many calories. Decrease your intake of foods high in solid fats, added sugars, and salt. Eat the right amount of calories for you. Get information about a proper diet from your health care provider, if necessary.  . Regular physical exercise is one of the most important things you can do for your health. Most adults should get at least 150 minutes of  moderate-intensity exercise (any activity that increases your heart rate and causes you to sweat) each week. In addition, most adults need muscle-strengthening exercises on 2 or more days a week.  Silver Sneakers may be a benefit available to you. To determine eligibility, you may visit the website: www.silversneakers.com or contact program at [phone] Mon-Fri between 8AM-8PM.   . Maintain a healthy weight. The body mass index (BMI) is a screening tool to identify possible weight problems. It provides an estimate of body fat based on height and weight. Your health care provider can find your BMI and can help you achieve or maintain a healthy weight.   For adults 20 years and older: ? A BMI below 18.5 is considered underweight. ? A BMI of 18.5 to 24.9 is normal. ? A BMI of 25 to 29.9 is considered overweight. ? A BMI of 30 and above is considered obese.   . Maintain normal blood lipids and cholesterol levels by exercising and minimizing your intake of saturated fat. Eat a balanced diet with plenty of fruit and vegetables. Blood tests for lipids and cholesterol should begin at age 80 and be repeated every 5 years. If your lipid or cholesterol levels are high, you are over 50, or you are at high risk for heart disease, you may need your cholesterol levels checked more frequently. Ongoing high lipid and cholesterol levels should be treated with medicines if diet and exercise are not working.  . If you smoke, find  out from your health care provider how to quit. If you do not use tobacco, please do not start.  . If you choose to drink alcohol, please do not consume more than 2 drinks per day. One drink is considered to be 12 ounces (355 mL) of beer, 5 ounces (148 mL) of wine, or 1.5 ounces (44 mL) of liquor.  . If you are 65-64 years old, ask your health care provider if you should take aspirin to prevent strokes.  . Use sunscreen. Apply sunscreen liberally and repeatedly throughout the day. You  should seek shade when your shadow is shorter than you. Protect yourself by wearing long sleeves, pants, a wide-brimmed hat, and sunglasses year round, whenever you are outdoors.  . Once a month, do a whole body skin exam, using a mirror to look at the skin on your back. Tell your health care provider of new moles, moles that have irregular borders, moles that are larger than a pencil eraser, or moles that have changed in shape or color.

## 2016-09-16 NOTE — Progress Notes (Signed)
Pre visit review using our clinic review tool, if applicable. No additional management support is needed unless otherwise documented below in the visit note. 

## 2016-09-18 NOTE — Progress Notes (Signed)
PCP notes:   Health maintenance:  Flu vaccine - postponed until CPE Mammogram - pt will discuss with PCP Bone density - pt will discuss with PCP  Abnormal screenings:   None  Patient concerns:   None  Nurse concerns:  None  Next PCP appt:   09/21/16 @ 1115

## 2016-09-18 NOTE — Progress Notes (Signed)
Subjective:   Margaret Hicks is a 82 y.o. female who presents for Medicare Annual (Subsequent) preventive examination.  Review of Systems:  N/A Cardiac Risk Factors include: advanced age (>555men, 44>65 women);obesity (BMI >30kg/m2);dyslipidemia;hypertension     Objective:     Vitals: BP 114/68 (BP Location: Left Arm, Patient Position: Sitting, Cuff Size: Normal)   Pulse (!) 57   Temp 98 F (36.7 C) (Oral)   Ht 5' 0.25" (1.53 m)   Wt 230 lb (104.3 kg)   SpO2 93%   BMI 44.55 kg/m   Body mass index is 44.55 kg/m.   Tobacco History  Smoking Status  . Never Smoker  Smokeless Tobacco  . Never Used     Counseling given: No   Past Medical History:  Diagnosis Date  . Acute gastritis   . Arthritis   . Back pain   . Dementia   . Esophageal reflux   . GERD (gastroesophageal reflux disease)   . Hemorrhoids   . Hyperlipemia   . Hypertension   . Hypothyroidism   . Obesity   . OP (osteoporosis)   . Renal insufficiency   . Schatzki's ring   . TIA (transient ischemic attack)   . Upper GI bleed   . Urinary incontinence    Past Surgical History:  Procedure Laterality Date  . ABDOMINAL HYSTERECTOMY    . CARDIAC CATHETERIZATION    . CHOLECYSTECTOMY    . TONSILLECTOMY     Family History  Problem Relation Age of Onset  . Dementia Mother   . Stroke Mother   . Dementia Brother   . Dementia Brother   . Arthritis Sister   . Cancer      nephew  . Heart disease Father   . Colon cancer Neg Hx    History  Sexual Activity  . Sexual activity: No    Outpatient Encounter Prescriptions as of 09/16/2016  Medication Sig  . acetaminophen (TYLENOL) 500 MG tablet Take 500 mg by mouth every 8 (eight) hours as needed for moderate pain or headache.  . Alum & Mag Hydroxide-Simeth (GERI-LANTA PO) Take 30 mLs by mouth 4 (four) times daily as needed.  . bismuth subsalicylate (PEPTO BISMOL) 262 MG/15ML suspension Take 10 mLs by mouth every 6 (six) hours as needed.  . cholecalciferol  (VITAMIN D) 1000 UNITS tablet Take 1,000 Units by mouth every evening.   Marland Kitchen. Dextromethorphan HBr (ROBAFEN COUGH PO) Take by mouth.  . lansoprazole (PREVACID) 15 MG capsule Take 1 capsule (15 mg total) by mouth 2 (two) times daily before a meal.  . levothyroxine (SYNTHROID, LEVOTHROID) 100 MCG tablet Take 1 tablet (100 mcg total) by mouth daily before breakfast.  . magnesium hydroxide (MILK OF MAGNESIA) 400 MG/5ML suspension Take 30 mLs by mouth daily as needed for mild constipation.  . metoprolol (LOPRESSOR) 100 MG tablet Take 1 tablet (100 mg total) by mouth 2 (two) times daily.  . Multiple Vitamin (MULTIVITAMIN) capsule Take 1 capsule by mouth daily.    Marland Kitchen. nystatin (MYCOSTATIN/NYSTOP) powder Apply topically 4 (four) times daily. To affected areas under breasts  . sertraline (ZOLOFT) 50 MG tablet TAKE 1 TABLET BY MOUTH DAILY  . spironolactone (ALDACTONE) 25 MG tablet Take 0.5 tablets (12.5 mg total) by mouth daily.  . valsartan (DIOVAN) 320 MG tablet Take 1 tablet (320 mg total) by mouth daily.   No facility-administered encounter medications on file as of 09/16/2016.     Activities of Daily Living In your present state of health,  do you have any difficulty performing the following activities: 09/16/2016  Hearing? N  Vision? N  Difficulty concentrating or making decisions? Y  Walking or climbing stairs? Y  Dressing or bathing? Y  Doing errands, shopping? Y  Preparing Food and eating ? Y  Using the Toilet? Y  In the past six months, have you accidently leaked urine? Y  Do you have problems with loss of bowel control? Y  Managing your Medications? Y  Managing your Finances? Y  Housekeeping or managing your Housekeeping? Y  Some recent data might be hidden    Patient Care Team: Judy PimpleMarne A Tower, MD as PCP - General    Assessment:     Visual Acuity Screening   Right eye Left eye Both eyes  Without correction:     With correction: 20/70 20/70 20/70   Hearing Screening Comments: Wears  bilateral hearing aids   Exercise Activities and Dietary recommendations Current Exercise Habits: Home exercise routine, Type of exercise: stretching;calisthenics, Time (Minutes): 30, Frequency (Times/Week): 3, Weekly Exercise (Minutes/Week): 90, Intensity: Mild, Exercise limited by: None identified  Fall Risk Fall Risk  09/16/2016 09/04/2015 09/01/2014  Falls in the past year? No Yes No  Number falls in past yr: - 1 -  Injury with Fall? - No -   Depression Screen PHQ 2/9 Scores 09/16/2016 09/04/2015 09/01/2014  PHQ - 2 Score 0 0 0     Cognitive Function MMSE - Mini Mental State Exam 09/16/2016  Not completed: (No Data)  NOTE: pt has dx of dementia      Immunization History  Administered Date(s) Administered  . Influenza Split 09/22/2011  . Influenza Whole 08/18/2010  . Influenza,inj,Quad PF,36+ Mos 09/01/2014, 09/04/2015  . Influenza-Unspecified 10/05/2013  . PPD Test 05/29/2013  . Pneumococcal Conjugate-13 09/04/2015  . Pneumococcal Polysaccharide-23 10/21/2013   Screening Tests Health Maintenance  Topic Date Due  . INFLUENZA VACCINE  11/20/2016 (Originally 06/21/2016)  . MAMMOGRAM  11/20/2016 (Originally 01/14/2014)  . DEXA SCAN  11/20/2016 (Originally 02/03/1999)  . TETANUS/TDAP  12/23/2020 (Originally 02/02/1951)  . ZOSTAVAX  Addressed  . PNA vac Low Risk Adult  Completed      Plan:     I have personally reviewed and addressed the Medicare Annual Wellness questionnaire and have noted the following in the patient's chart:  A. Medical and social history B. Use of alcohol, tobacco or illicit drugs  C. Current medications and supplements D. Functional ability and status E.  Nutritional status F.  Physical activity G. Advance directives H. List of other physicians I.  Hospitalizations, surgeries, and ER visits in previous 12 months J.  Vitals K. Screenings to include hearing, vision, cognitive, depression L. Referrals and appointments - none  In addition, I  have reviewed and discussed with patient certain preventive protocols, quality metrics, and best practice recommendations. A written personalized care plan for preventive services as well as general preventive health recommendations were provided to patient.  See attached scanned questionnaire for additional information.   Signed,   Randa EvensLesia Cotina Freedman, MHA, BS, LPN Health Coach

## 2016-09-19 DIAGNOSIS — R488 Other symbolic dysfunctions: Secondary | ICD-10-CM | POA: Diagnosis not present

## 2016-09-19 DIAGNOSIS — F0390 Unspecified dementia without behavioral disturbance: Secondary | ICD-10-CM | POA: Diagnosis not present

## 2016-09-19 DIAGNOSIS — M6281 Muscle weakness (generalized): Secondary | ICD-10-CM | POA: Diagnosis not present

## 2016-09-19 DIAGNOSIS — Z741 Need for assistance with personal care: Secondary | ICD-10-CM | POA: Diagnosis not present

## 2016-09-19 LAB — TSH: TSH: 3.88 u[IU]/mL (ref 0.35–4.50)

## 2016-09-19 NOTE — Progress Notes (Signed)
I reviewed health advisor's note, was available for consultation, and agree with documentation and plan.  

## 2016-09-20 DIAGNOSIS — M6281 Muscle weakness (generalized): Secondary | ICD-10-CM | POA: Diagnosis not present

## 2016-09-20 DIAGNOSIS — F0390 Unspecified dementia without behavioral disturbance: Secondary | ICD-10-CM | POA: Diagnosis not present

## 2016-09-20 DIAGNOSIS — R488 Other symbolic dysfunctions: Secondary | ICD-10-CM | POA: Diagnosis not present

## 2016-09-20 DIAGNOSIS — Z741 Need for assistance with personal care: Secondary | ICD-10-CM | POA: Diagnosis not present

## 2016-09-21 ENCOUNTER — Encounter: Payer: Medicare Other | Admitting: Family Medicine

## 2016-09-21 DIAGNOSIS — Z741 Need for assistance with personal care: Secondary | ICD-10-CM | POA: Diagnosis not present

## 2016-09-21 DIAGNOSIS — R488 Other symbolic dysfunctions: Secondary | ICD-10-CM | POA: Diagnosis not present

## 2016-09-21 DIAGNOSIS — M6281 Muscle weakness (generalized): Secondary | ICD-10-CM | POA: Diagnosis not present

## 2016-09-21 DIAGNOSIS — F0390 Unspecified dementia without behavioral disturbance: Secondary | ICD-10-CM | POA: Diagnosis not present

## 2016-09-22 DIAGNOSIS — R488 Other symbolic dysfunctions: Secondary | ICD-10-CM | POA: Diagnosis not present

## 2016-09-22 DIAGNOSIS — Z741 Need for assistance with personal care: Secondary | ICD-10-CM | POA: Diagnosis not present

## 2016-09-22 DIAGNOSIS — F0390 Unspecified dementia without behavioral disturbance: Secondary | ICD-10-CM | POA: Diagnosis not present

## 2016-09-22 DIAGNOSIS — M6281 Muscle weakness (generalized): Secondary | ICD-10-CM | POA: Diagnosis not present

## 2016-09-23 DIAGNOSIS — Z741 Need for assistance with personal care: Secondary | ICD-10-CM | POA: Diagnosis not present

## 2016-09-23 DIAGNOSIS — M6281 Muscle weakness (generalized): Secondary | ICD-10-CM | POA: Diagnosis not present

## 2016-09-23 DIAGNOSIS — F0390 Unspecified dementia without behavioral disturbance: Secondary | ICD-10-CM | POA: Diagnosis not present

## 2016-09-23 DIAGNOSIS — R488 Other symbolic dysfunctions: Secondary | ICD-10-CM | POA: Diagnosis not present

## 2016-09-25 DIAGNOSIS — Z741 Need for assistance with personal care: Secondary | ICD-10-CM | POA: Diagnosis not present

## 2016-09-25 DIAGNOSIS — M6281 Muscle weakness (generalized): Secondary | ICD-10-CM | POA: Diagnosis not present

## 2016-09-25 DIAGNOSIS — R488 Other symbolic dysfunctions: Secondary | ICD-10-CM | POA: Diagnosis not present

## 2016-09-25 DIAGNOSIS — F0390 Unspecified dementia without behavioral disturbance: Secondary | ICD-10-CM | POA: Diagnosis not present

## 2016-09-26 ENCOUNTER — Encounter: Payer: Self-pay | Admitting: Family Medicine

## 2016-09-26 ENCOUNTER — Ambulatory Visit (INDEPENDENT_AMBULATORY_CARE_PROVIDER_SITE_OTHER): Payer: Medicare Other | Admitting: Family Medicine

## 2016-09-26 VITALS — BP 124/82 | HR 58 | Temp 98.3°F | Ht 60.5 in | Wt 227.2 lb

## 2016-09-26 DIAGNOSIS — Z1231 Encounter for screening mammogram for malignant neoplasm of breast: Secondary | ICD-10-CM

## 2016-09-26 DIAGNOSIS — E039 Hypothyroidism, unspecified: Secondary | ICD-10-CM | POA: Diagnosis not present

## 2016-09-26 DIAGNOSIS — Z741 Need for assistance with personal care: Secondary | ICD-10-CM | POA: Diagnosis not present

## 2016-09-26 DIAGNOSIS — R488 Other symbolic dysfunctions: Secondary | ICD-10-CM | POA: Diagnosis not present

## 2016-09-26 DIAGNOSIS — F0390 Unspecified dementia without behavioral disturbance: Secondary | ICD-10-CM | POA: Diagnosis not present

## 2016-09-26 DIAGNOSIS — E785 Hyperlipidemia, unspecified: Secondary | ICD-10-CM | POA: Diagnosis not present

## 2016-09-26 DIAGNOSIS — M6281 Muscle weakness (generalized): Secondary | ICD-10-CM | POA: Diagnosis not present

## 2016-09-26 DIAGNOSIS — I10 Essential (primary) hypertension: Secondary | ICD-10-CM | POA: Diagnosis not present

## 2016-09-26 DIAGNOSIS — Z23 Encounter for immunization: Secondary | ICD-10-CM

## 2016-09-26 DIAGNOSIS — F32A Depression, unspecified: Secondary | ICD-10-CM

## 2016-09-26 DIAGNOSIS — R600 Localized edema: Secondary | ICD-10-CM

## 2016-09-26 DIAGNOSIS — F329 Major depressive disorder, single episode, unspecified: Secondary | ICD-10-CM

## 2016-09-26 DIAGNOSIS — N289 Disorder of kidney and ureter, unspecified: Secondary | ICD-10-CM

## 2016-09-26 MED ORDER — VALSARTAN 320 MG PO TABS: 320.0000 mg | ORAL_TABLET | Freq: Every day | ORAL | 3 refills | Status: DC

## 2016-09-26 MED ORDER — LANSOPRAZOLE 15 MG PO CPDR: 15.0000 mg | DELAYED_RELEASE_CAPSULE | Freq: Every day | ORAL | 3 refills | Status: DC

## 2016-09-26 MED ORDER — SERTRALINE HCL 50 MG PO TABS: 50.0000 mg | ORAL_TABLET | Freq: Every day | ORAL | 3 refills | Status: DC

## 2016-09-26 MED ORDER — METOPROLOL TARTRATE 100 MG PO TABS: 50.0000 mg | ORAL_TABLET | Freq: Two times a day (BID) | ORAL | 3 refills | Status: DC

## 2016-09-26 MED ORDER — LEVOTHYROXINE SODIUM 100 MCG PO TABS: 100.0000 ug | ORAL_TABLET | Freq: Every day | ORAL | 3 refills | Status: DC

## 2016-09-26 MED ORDER — SPIRONOLACTONE 25 MG PO TABS: 12.5000 mg | ORAL_TABLET | Freq: Every day | ORAL | 3 refills | Status: DC

## 2016-09-26 NOTE — Assessment & Plan Note (Signed)
Disc goals for lipids and reasons to control them Rev labs with pt Rev low sat fat diet in detail Intolerant of statins Difficult to control diet in light of dementia

## 2016-09-26 NOTE — Assessment & Plan Note (Signed)
bp in fair control at this time  BP Readings from Last 1 Encounters:  09/26/16 124/82   No changes needed Disc lifstyle change with low sodium diet and exercise   continue valsartan and metoprolol and spironolactone Labs reviewed

## 2016-09-26 NOTE — Assessment & Plan Note (Signed)
Has seen Dr Cherylann RatelLateef for CKD 3 Multifactorial  bp is well controlled  On diovan and spironolactone with nl K level Again enc water intake

## 2016-09-26 NOTE — Patient Instructions (Addendum)
Stop at check out regarding mammogram-if you change your mind you can cancel it  Flu shot today  You are due for a tetanus shot -take a look at the handout regarding getting it at a pharmacy (or the health dept.) I clarified metoprolol order and reviewed medicines  You can try prevacid once daily instead of twice (if any GI symptoms develop let me know) Encourage fluid intake  Keep toe nails trimmed

## 2016-09-26 NOTE — Assessment & Plan Note (Signed)
Hypothyroidism  Pt has no clinical changes No change in energy level/ hair or skin/ edema and no tremor Lab Results  Component Value Date   TSH 3.88 09/16/2016

## 2016-09-26 NOTE — Assessment & Plan Note (Signed)
With dementia Pt continues to do well with sertraline-is generally agreeable and content  Good appetite and sleep

## 2016-09-26 NOTE — Progress Notes (Signed)
Subjective:    Patient ID: Margaret Hicks, female    DOB: [birth year], 82 y.o.   MRN: [id number]  HPI Here for annual f/u for chronic medical problems   She has visit for AMW last month  No concerns   Flu vaccine due -will get that today   Mammogram -had one 2/14 - would like to get back on track with mammograms - will talk with family  Self breast exam  Has had a hysterectomy in the past   dexa - declines  No falls recently  Does use a walker  No fractures   Tetanus shot - is due for -given a handout regarding getting it at a pharmacy   Zoster vaccine 09  Wt Readings from Last 3 Encounters:  09/26/16 227 lb 4 oz (103.1 kg)  09/16/16 230 lb (104.3 kg)  06/14/16 225 lb 12.8 oz (102.4 kg)  fights the weight - appetite is good / will encourage her to keep walking with a walker  bmi is 43.6 Morbid obese category   She lives in [facility]  Someone works with her regarding walking   bp is stable today  No cp or palpitations or headaches or edema  No side effects to medicines  BP Readings from Last 3 Encounters:  09/26/16 124/82  09/16/16 114/68  06/14/16 116/70    There was a question regarding her metoprololorder  She takes valsartan 320 Spironolactone 25mg   Metoprolol 100 mg bid - this was recuced to 1/2 in 2014 when she had some orthostatic issues  Pulse Readings from Last 3 Encounters:  09/26/16 (!) 58  09/16/16 (!) 57  06/14/16 68   She wears support stockings that are uncomfortable Would like to change to socks    Hypothyroidism  Pt has no clinical changes No change in energy level/ hair or skin/ edema and no tremor Lab Results  Component Value Date   TSH 3.88 09/16/2016      Hx of dementia  Family wanted to avoid medication Slowly progressive Takes ssri for mood   Hx of hyperlipidemia Lab Results  Component Value Date   CHOL 199 09/16/2016   CHOL 222 (H) 09/04/2015   CHOL 220 (H) 09/01/2014   Lab Results    Component Value Date   HDL 47.80 09/16/2016   HDL 51.30 09/04/2015   HDL 46.00 09/01/2014   Lab Results  Component Value Date   LDLCALC 137 (H) 09/01/2014   LDLCALC 129 (H) 04/10/2013   Lab Results  Component Value Date   TRIG 267.0 (H) 09/16/2016   TRIG 235.0 (H) 09/04/2015   TRIG 186.0 (H) 09/01/2014   Lab Results  Component Value Date   CHOLHDL 4 09/16/2016   CHOLHDL 4 09/04/2015   CHOLHDL 5 09/01/2014   Lab Results  Component Value Date   LDLDIRECT 126.0 09/16/2016   LDLDIRECT 133.0 09/04/2015   LDLDIRECT 161.0 11/23/2010    Intolerance of statins   Does have hx of mini stokes   Results for orders placed or performed in visit on 09/16/16  Comprehensive metabolic panel  Result Value Ref Range   Sodium 138 135 - 145 mEq/L   Potassium 4.3 3.5 - 5.1 mEq/L   Chloride 103 96 - 112 mEq/L   CO2 29 19 - 32 mEq/L   Glucose, Bld 118 (H) 70 - 99 mg/dL   BUN 26 (H) 6 - 23 mg/dL   Creatinine, Ser 4.09 0.40 - 1.20 mg/dL   Total Bilirubin 0.5  0.2 - 1.2 mg/dL   Alkaline Phosphatase 95 39 - 117 U/L   AST 15 0 - 37 U/L   ALT 12 0 - 35 U/L   Total Protein 6.6 6.0 - 8.3 g/dL   Albumin 4.0 3.5 - 5.2 g/dL   Calcium 9.6 8.4 - 13.010.5 mg/dL   GFR 86.5746.09 (L) >84.69>60.00 mL/min  CBC with Differential/Platelet  Result Value Ref Range   WBC 5.7 4.0 - 10.5 K/uL   RBC 4.64 3.87 - 5.11 Mil/uL   Hemoglobin 13.1 12.0 - 15.0 g/dL   HCT 62.938.7 52.836.0 - 41.346.0 %   MCV 83.5 78.0 - 100.0 fl   MCHC 33.7 30.0 - 36.0 g/dL   RDW 24.414.4 01.011.5 - 27.215.5 %   Platelets 234.0 150.0 - 400.0 K/uL   Neutrophils Relative % 60.7 43.0 - 77.0 %   Lymphocytes Relative 29.8 12.0 - 46.0 %   Monocytes Relative 8.1 3.0 - 12.0 %   Eosinophils Relative 0.9 0.0 - 5.0 %   Basophils Relative 0.5 0.0 - 3.0 %   Neutro Abs 3.4 1.4 - 7.7 K/uL   Lymphs Abs 1.7 0.7 - 4.0 K/uL   Monocytes Absolute 0.5 0.1 - 1.0 K/uL   Eosinophils Absolute 0.1 0.0 - 0.7 K/uL   Basophils Absolute 0.0 0.0 - 0.1 K/uL  Lipid panel  Result Value Ref  Range   Cholesterol 199 0 - 200 mg/dL   Triglycerides 536.6267.0 (H) 0.0 - 149.0 mg/dL   HDL 44.0347.80 >47.42>39.00 mg/dL   VLDL 59.553.4 (H) 0.0 - 63.840.0 mg/dL   Total CHOL/HDL Ratio 4    NonHDL 151.54   TSH  Result Value Ref Range   TSH 3.88 0.35 - 4.50 uIU/mL  LDL cholesterol, direct  Result Value Ref Range   Direct LDL 126.0 mg/dL      Patient Active Problem List   Diagnosis Date Noted  . Screening mammogram, encounter for 09/26/2016  . Dehydration 12/17/2014  . Mental status change 12/17/2014  . Mobility impaired 09/17/2014  . Encounter for Medicare annual wellness exam 09/01/2014  . Internal hemorrhoids 08/27/2014  . Pedal edema 08/12/2013  . Dysphagia, unspecified(787.20) 05/29/2013  . Aneurysm, splenic artery (HCC) 05/29/2013  . Senile dementia, uncomplicated 01/09/2013  . Back pain, thoracic 11/28/2012  . Depression 11/28/2012  . Other screening mammogram 11/28/2012  . EXTERNAL HEMORRHOIDS 01/26/2011  . GERD 09/01/2010  . Hypothyroidism 05/27/2010  . Hyperlipidemia LDL goal <100 05/27/2010  . Essential hypertension 05/27/2010  . Mild renal insufficiency 05/27/2010  . Unspecified urinary incontinence 05/27/2010  . Personal history of urinary disorder 05/27/2010   Past Medical History:  Diagnosis Date  . Acute gastritis   . Arthritis   . Back pain   . Dementia   . Esophageal reflux   . GERD (gastroesophageal reflux disease)   . Hemorrhoids   . Hyperlipemia   . Hypertension   . Hypothyroidism   . Obesity   . OP (osteoporosis)   . Renal insufficiency   . Schatzki's ring   . TIA (transient ischemic attack)   . Upper GI bleed   . Urinary incontinence    Past Surgical History:  Procedure Laterality Date  . ABDOMINAL HYSTERECTOMY    . CARDIAC CATHETERIZATION    . CHOLECYSTECTOMY    . TONSILLECTOMY     Social History  Substance Use Topics  . Smoking status: Never Smoker  . Smokeless tobacco: Never Used  . Alcohol use No   Family History  Problem Relation Age of  Onset  .  Dementia Mother   . Stroke Mother   . Dementia Brother   . Dementia Brother   . Arthritis Sister   . Cancer      nephew  . Heart disease Father   . Colon cancer Neg Hx    Allergies  Allergen Reactions  . Simvastatin     REACTION: muscle pain/ memory issues   Current Outpatient Prescriptions on File Prior to Visit  Medication Sig Dispense Refill  . acetaminophen (TYLENOL) 500 MG tablet Take 500 mg by mouth every 8 (eight) hours as needed for moderate pain or headache.    . Alum & Mag Hydroxide-Simeth (GERI-LANTA PO) Take 30 mLs by mouth 4 (four) times daily as needed.    . bismuth subsalicylate (PEPTO BISMOL) 262 MG/15ML suspension Take 10 mLs by mouth every 6 (six) hours as needed.    . cholecalciferol (VITAMIN D) 1000 UNITS tablet Take 1,000 Units by mouth every evening.     Marland Kitchen Dextromethorphan HBr (ROBAFEN COUGH PO) Take by mouth.    . magnesium hydroxide (MILK OF MAGNESIA) 400 MG/5ML suspension Take 30 mLs by mouth daily as needed for mild constipation.    . Multiple Vitamin (MULTIVITAMIN) capsule Take 1 capsule by mouth daily.      Marland Kitchen nystatin (MYCOSTATIN/NYSTOP) powder Apply topically 4 (four) times daily. To affected areas under breasts 30 g 2   No current facility-administered medications on file prior to visit.     Review of Systems Review of Systems  Constitutional: Negative for fever, appetite change, fatigue and unexpected weight change.  Eyes: Negative for pain and visual disturbance.  Respiratory: Negative for cough and shortness of breath.   Cardiovascular: Negative for cp or palpitations   pos for baseline pedal edema Gastrointestinal: Negative for nausea, diarrhea and constipation.  Genitourinary: Negative for urgency and frequency.  Skin: Negative for pallor or rash   MSK pos for chronic back and arthritis pain  Neurological: Negative for weakness, light-headedness, numbness and headaches. pos for dementia with loss of short term memory Hematological:  Negative for adenopathy. Does not bruise/bleed easily.  Psychiatric/Behavioral: Negative for dysphoric mood. The patient is not nervous/anxious.  (mood is stable on sertraline)       Objective:   Physical Exam  Constitutional: She appears well-developed and well-nourished. No distress.  Morbidly obese frail appearing elderly female  HENT:  Head: Normocephalic and atraumatic.  Right Ear: External ear normal.  Left Ear: External ear normal.  Nose: Nose normal.  Mouth/Throat: Oropharynx is clear and moist.  Eyes: Conjunctivae and EOM are normal. Pupils are equal, round, and reactive to light. Right eye exhibits no discharge. Left eye exhibits no discharge. No scleral icterus.  Neck: Normal range of motion. Neck supple. No JVD present. Carotid bruit is not present. No thyromegaly present.  Cardiovascular: Normal rate, regular rhythm, normal heart sounds and intact distal pulses.  Exam reveals no gallop.   Varicosities in ankles diffusely-no ulceration  Pulmonary/Chest: Effort normal and breath sounds normal. No respiratory distress. She has no wheezes. She has no rales.  Abdominal: Soft. Bowel sounds are normal. She exhibits no distension and no mass. There is no tenderness.  Musculoskeletal: She exhibits no edema or tenderness.  One plus pedal edema /also with lymphedema  No skin breakdown   Moderate foot deformity noted with overlapping 2nd toe (over first) -worse on R foot  Wearing well fitting shoes with deep toe box  Lymphadenopathy:    She has no cervical adenopathy.  Neurological: She is alert.  She has normal reflexes. No cranial nerve deficit. She exhibits normal muscle tone. Coordination normal.  Gait is slow with walker  Skin: Skin is warm and dry. No rash noted. No erythema. No pallor.  Some dry skin SKs on trunk   Overgrown great toe nails bilat   Psychiatric: Her mood appears not anxious. Cognition and memory are impaired. She does not exhibit a depressed mood. She  exhibits abnormal recent memory.  Moderate dementia  Pt repeats phrases and looks at a magazine Very pleasant and content  Smiles throughout interview and laughs occasionally          Assessment & Plan:   Problem List Items Addressed This Visit      Cardiovascular and Mediastinum   Essential hypertension - Primary    bp in fair control at this time  BP Readings from Last 1 Encounters:  09/26/16 124/82   No changes needed Disc lifstyle change with low sodium diet and exercise   continue valsartan and metoprolol and spironolactone Labs reviewed      Relevant Medications   valsartan (DIOVAN) 320 MG tablet   metoprolol (LOPRESSOR) 100 MG tablet   spironolactone (ALDACTONE) 25 MG tablet     Endocrine   Hypothyroidism    Hypothyroidism  Pt has no clinical changes No change in energy level/ hair or skin/ edema and no tremor Lab Results  Component Value Date   TSH 3.88 09/16/2016          Relevant Medications   levothyroxine (SYNTHROID, LEVOTHROID) 100 MCG tablet   metoprolol (LOPRESSOR) 100 MG tablet     Nervous and Auditory   Senile dementia, uncomplicated    Stable to slowly progressive  Overall she seems content and comfortable  Good appetite Thriving in her assisted living environment  No dementia medications Is on sertraline for mood  Continue to follow      Relevant Medications   sertraline (ZOLOFT) 50 MG tablet     Genitourinary   Mild renal insufficiency    Has seen Dr Cherylann Ratel for CKD 3 Multifactorial  bp is well controlled  On diovan and spironolactone with nl K level Again enc water intake         Other   Depression    With dementia Pt continues to do well with sertraline-is generally agreeable and content  Good appetite and sleep      Relevant Medications   sertraline (ZOLOFT) 50 MG tablet   Hyperlipidemia LDL goal <100    Disc goals for lipids and reasons to control them Rev labs with pt Rev low sat fat diet in detail Intolerant  of statins Difficult to control diet in light of dementia        Relevant Medications   valsartan (DIOVAN) 320 MG tablet   metoprolol (LOPRESSOR) 100 MG tablet   spironolactone (ALDACTONE) 25 MG tablet   Morbid obesity (HCC)    In elderly mobility impaired female with dementia  Can inst her asst living to limit portions and sweets Exercise is difficult- but she does walk regularly with a walker        Pedal edema    Ongoing- suspect some lymphedema and some fluid retention She no longer tolerates supp hose On very small dose of spironolactone  Enc elevating legs when not ambulating  bp is stable        Screening mammogram, encounter for    Referred for screening mammogram  Her son will d/w family- if they decide against it they  will cancel it  Unsure if they would treat if positive in elderly woman with dementia       Relevant Orders   MM DIGITAL SCREENING BILATERAL    Other Visit Diagnoses    Need for influenza vaccination       Relevant Orders   Flu Vaccine QUAD 36+ mos IM (Completed)

## 2016-09-26 NOTE — Assessment & Plan Note (Signed)
Referred for screening mammogram  Her son will d/w family- if they decide against it they will cancel it  Unsure if they would treat if positive in elderly woman with dementia

## 2016-09-26 NOTE — Assessment & Plan Note (Signed)
Ongoing- suspect some lymphedema and some fluid retention She no longer tolerates supp hose On very small dose of spironolactone  Enc elevating legs when not ambulating  bp is stable

## 2016-09-26 NOTE — Progress Notes (Signed)
Pre visit review using our clinic review tool, if applicable. No additional management support is needed unless otherwise documented below in the visit note. 

## 2016-09-26 NOTE — Assessment & Plan Note (Signed)
Stable to slowly progressive  Overall she seems content and comfortable  Good appetite Thriving in her assisted living environment  No dementia medications Is on sertraline for mood  Continue to follow

## 2016-09-26 NOTE — Assessment & Plan Note (Signed)
In elderly mobility impaired female with dementia  Can inst her asst living to limit portions and sweets Exercise is difficult- but she does walk regularly with a walker

## 2016-09-27 DIAGNOSIS — M6281 Muscle weakness (generalized): Secondary | ICD-10-CM | POA: Diagnosis not present

## 2016-09-27 DIAGNOSIS — Z741 Need for assistance with personal care: Secondary | ICD-10-CM | POA: Diagnosis not present

## 2016-09-27 DIAGNOSIS — R488 Other symbolic dysfunctions: Secondary | ICD-10-CM | POA: Diagnosis not present

## 2016-09-27 DIAGNOSIS — F0390 Unspecified dementia without behavioral disturbance: Secondary | ICD-10-CM | POA: Diagnosis not present

## 2016-09-28 ENCOUNTER — Encounter: Payer: Medicare Other | Admitting: Family Medicine

## 2016-09-28 DIAGNOSIS — R488 Other symbolic dysfunctions: Secondary | ICD-10-CM | POA: Diagnosis not present

## 2016-09-28 DIAGNOSIS — F0390 Unspecified dementia without behavioral disturbance: Secondary | ICD-10-CM | POA: Diagnosis not present

## 2016-09-28 DIAGNOSIS — M6281 Muscle weakness (generalized): Secondary | ICD-10-CM | POA: Diagnosis not present

## 2016-09-28 DIAGNOSIS — Z741 Need for assistance with personal care: Secondary | ICD-10-CM | POA: Diagnosis not present

## 2016-09-29 DIAGNOSIS — R488 Other symbolic dysfunctions: Secondary | ICD-10-CM | POA: Diagnosis not present

## 2016-09-29 DIAGNOSIS — F0390 Unspecified dementia without behavioral disturbance: Secondary | ICD-10-CM | POA: Diagnosis not present

## 2016-09-29 DIAGNOSIS — M6281 Muscle weakness (generalized): Secondary | ICD-10-CM | POA: Diagnosis not present

## 2016-09-29 DIAGNOSIS — Z741 Need for assistance with personal care: Secondary | ICD-10-CM | POA: Diagnosis not present

## 2016-10-03 DIAGNOSIS — Z741 Need for assistance with personal care: Secondary | ICD-10-CM | POA: Diagnosis not present

## 2016-10-03 DIAGNOSIS — R488 Other symbolic dysfunctions: Secondary | ICD-10-CM | POA: Diagnosis not present

## 2016-10-03 DIAGNOSIS — F0390 Unspecified dementia without behavioral disturbance: Secondary | ICD-10-CM | POA: Diagnosis not present

## 2016-10-03 DIAGNOSIS — M6281 Muscle weakness (generalized): Secondary | ICD-10-CM | POA: Diagnosis not present

## 2016-10-04 DIAGNOSIS — R488 Other symbolic dysfunctions: Secondary | ICD-10-CM | POA: Diagnosis not present

## 2016-10-04 DIAGNOSIS — F0390 Unspecified dementia without behavioral disturbance: Secondary | ICD-10-CM | POA: Diagnosis not present

## 2016-10-04 DIAGNOSIS — Z741 Need for assistance with personal care: Secondary | ICD-10-CM | POA: Diagnosis not present

## 2016-10-04 DIAGNOSIS — M6281 Muscle weakness (generalized): Secondary | ICD-10-CM | POA: Diagnosis not present

## 2016-10-05 DIAGNOSIS — F0390 Unspecified dementia without behavioral disturbance: Secondary | ICD-10-CM | POA: Diagnosis not present

## 2016-10-05 DIAGNOSIS — Z741 Need for assistance with personal care: Secondary | ICD-10-CM | POA: Diagnosis not present

## 2016-10-05 DIAGNOSIS — M6281 Muscle weakness (generalized): Secondary | ICD-10-CM | POA: Diagnosis not present

## 2016-10-05 DIAGNOSIS — R488 Other symbolic dysfunctions: Secondary | ICD-10-CM | POA: Diagnosis not present

## 2016-10-06 DIAGNOSIS — Z741 Need for assistance with personal care: Secondary | ICD-10-CM | POA: Diagnosis not present

## 2016-10-06 DIAGNOSIS — F0390 Unspecified dementia without behavioral disturbance: Secondary | ICD-10-CM | POA: Diagnosis not present

## 2016-10-06 DIAGNOSIS — M6281 Muscle weakness (generalized): Secondary | ICD-10-CM | POA: Diagnosis not present

## 2016-10-06 DIAGNOSIS — R488 Other symbolic dysfunctions: Secondary | ICD-10-CM | POA: Diagnosis not present

## 2016-10-07 DIAGNOSIS — R488 Other symbolic dysfunctions: Secondary | ICD-10-CM | POA: Diagnosis not present

## 2016-10-07 DIAGNOSIS — Z741 Need for assistance with personal care: Secondary | ICD-10-CM | POA: Diagnosis not present

## 2016-10-07 DIAGNOSIS — F0390 Unspecified dementia without behavioral disturbance: Secondary | ICD-10-CM | POA: Diagnosis not present

## 2016-10-07 DIAGNOSIS — M6281 Muscle weakness (generalized): Secondary | ICD-10-CM | POA: Diagnosis not present

## 2016-10-08 DIAGNOSIS — F0390 Unspecified dementia without behavioral disturbance: Secondary | ICD-10-CM | POA: Diagnosis not present

## 2016-10-08 DIAGNOSIS — M6281 Muscle weakness (generalized): Secondary | ICD-10-CM | POA: Diagnosis not present

## 2016-10-08 DIAGNOSIS — R488 Other symbolic dysfunctions: Secondary | ICD-10-CM | POA: Diagnosis not present

## 2016-10-08 DIAGNOSIS — Z741 Need for assistance with personal care: Secondary | ICD-10-CM | POA: Diagnosis not present

## 2016-10-09 DIAGNOSIS — R488 Other symbolic dysfunctions: Secondary | ICD-10-CM | POA: Diagnosis not present

## 2016-10-09 DIAGNOSIS — M6281 Muscle weakness (generalized): Secondary | ICD-10-CM | POA: Diagnosis not present

## 2016-10-09 DIAGNOSIS — Z741 Need for assistance with personal care: Secondary | ICD-10-CM | POA: Diagnosis not present

## 2016-10-09 DIAGNOSIS — F0390 Unspecified dementia without behavioral disturbance: Secondary | ICD-10-CM | POA: Diagnosis not present

## 2016-10-10 DIAGNOSIS — M6281 Muscle weakness (generalized): Secondary | ICD-10-CM | POA: Diagnosis not present

## 2016-10-10 DIAGNOSIS — Z741 Need for assistance with personal care: Secondary | ICD-10-CM | POA: Diagnosis not present

## 2016-10-10 DIAGNOSIS — F0390 Unspecified dementia without behavioral disturbance: Secondary | ICD-10-CM | POA: Diagnosis not present

## 2016-10-10 DIAGNOSIS — R488 Other symbolic dysfunctions: Secondary | ICD-10-CM | POA: Diagnosis not present

## 2016-10-11 DIAGNOSIS — F0390 Unspecified dementia without behavioral disturbance: Secondary | ICD-10-CM | POA: Diagnosis not present

## 2016-10-11 DIAGNOSIS — R488 Other symbolic dysfunctions: Secondary | ICD-10-CM | POA: Diagnosis not present

## 2016-10-11 DIAGNOSIS — M6281 Muscle weakness (generalized): Secondary | ICD-10-CM | POA: Diagnosis not present

## 2016-10-11 DIAGNOSIS — Z741 Need for assistance with personal care: Secondary | ICD-10-CM | POA: Diagnosis not present

## 2016-10-12 DIAGNOSIS — Z741 Need for assistance with personal care: Secondary | ICD-10-CM | POA: Diagnosis not present

## 2016-10-12 DIAGNOSIS — F0390 Unspecified dementia without behavioral disturbance: Secondary | ICD-10-CM | POA: Diagnosis not present

## 2016-10-12 DIAGNOSIS — M6281 Muscle weakness (generalized): Secondary | ICD-10-CM | POA: Diagnosis not present

## 2016-10-12 DIAGNOSIS — R488 Other symbolic dysfunctions: Secondary | ICD-10-CM | POA: Diagnosis not present

## 2016-10-14 DIAGNOSIS — Z741 Need for assistance with personal care: Secondary | ICD-10-CM | POA: Diagnosis not present

## 2016-10-14 DIAGNOSIS — R488 Other symbolic dysfunctions: Secondary | ICD-10-CM | POA: Diagnosis not present

## 2016-10-14 DIAGNOSIS — F0390 Unspecified dementia without behavioral disturbance: Secondary | ICD-10-CM | POA: Diagnosis not present

## 2016-10-14 DIAGNOSIS — M6281 Muscle weakness (generalized): Secondary | ICD-10-CM | POA: Diagnosis not present

## 2016-10-15 DIAGNOSIS — M6281 Muscle weakness (generalized): Secondary | ICD-10-CM | POA: Diagnosis not present

## 2016-10-15 DIAGNOSIS — F0390 Unspecified dementia without behavioral disturbance: Secondary | ICD-10-CM | POA: Diagnosis not present

## 2016-10-15 DIAGNOSIS — Z741 Need for assistance with personal care: Secondary | ICD-10-CM | POA: Diagnosis not present

## 2016-10-15 DIAGNOSIS — R488 Other symbolic dysfunctions: Secondary | ICD-10-CM | POA: Diagnosis not present

## 2016-10-17 DIAGNOSIS — R488 Other symbolic dysfunctions: Secondary | ICD-10-CM | POA: Diagnosis not present

## 2016-10-17 DIAGNOSIS — M6281 Muscle weakness (generalized): Secondary | ICD-10-CM | POA: Diagnosis not present

## 2016-10-17 DIAGNOSIS — F0390 Unspecified dementia without behavioral disturbance: Secondary | ICD-10-CM | POA: Diagnosis not present

## 2016-10-17 DIAGNOSIS — Z741 Need for assistance with personal care: Secondary | ICD-10-CM | POA: Diagnosis not present

## 2016-10-18 DIAGNOSIS — F0390 Unspecified dementia without behavioral disturbance: Secondary | ICD-10-CM | POA: Diagnosis not present

## 2016-10-18 DIAGNOSIS — M6281 Muscle weakness (generalized): Secondary | ICD-10-CM | POA: Diagnosis not present

## 2016-10-18 DIAGNOSIS — R488 Other symbolic dysfunctions: Secondary | ICD-10-CM | POA: Diagnosis not present

## 2016-10-18 DIAGNOSIS — Z741 Need for assistance with personal care: Secondary | ICD-10-CM | POA: Diagnosis not present

## 2016-10-19 DIAGNOSIS — R488 Other symbolic dysfunctions: Secondary | ICD-10-CM | POA: Diagnosis not present

## 2016-10-19 DIAGNOSIS — M6281 Muscle weakness (generalized): Secondary | ICD-10-CM | POA: Diagnosis not present

## 2016-10-19 DIAGNOSIS — F0390 Unspecified dementia without behavioral disturbance: Secondary | ICD-10-CM | POA: Diagnosis not present

## 2016-10-19 DIAGNOSIS — Z741 Need for assistance with personal care: Secondary | ICD-10-CM | POA: Diagnosis not present

## 2016-10-20 DIAGNOSIS — R488 Other symbolic dysfunctions: Secondary | ICD-10-CM | POA: Diagnosis not present

## 2016-10-20 DIAGNOSIS — M6281 Muscle weakness (generalized): Secondary | ICD-10-CM | POA: Diagnosis not present

## 2016-10-20 DIAGNOSIS — Z741 Need for assistance with personal care: Secondary | ICD-10-CM | POA: Diagnosis not present

## 2016-10-20 DIAGNOSIS — F0390 Unspecified dementia without behavioral disturbance: Secondary | ICD-10-CM | POA: Diagnosis not present

## 2016-10-21 DIAGNOSIS — R488 Other symbolic dysfunctions: Secondary | ICD-10-CM | POA: Diagnosis not present

## 2016-10-21 DIAGNOSIS — M6281 Muscle weakness (generalized): Secondary | ICD-10-CM | POA: Diagnosis not present

## 2016-10-21 DIAGNOSIS — Z741 Need for assistance with personal care: Secondary | ICD-10-CM | POA: Diagnosis not present

## 2016-10-21 DIAGNOSIS — F0390 Unspecified dementia without behavioral disturbance: Secondary | ICD-10-CM | POA: Diagnosis not present

## 2016-10-22 DIAGNOSIS — Z741 Need for assistance with personal care: Secondary | ICD-10-CM | POA: Diagnosis not present

## 2016-10-22 DIAGNOSIS — R488 Other symbolic dysfunctions: Secondary | ICD-10-CM | POA: Diagnosis not present

## 2016-10-22 DIAGNOSIS — F0390 Unspecified dementia without behavioral disturbance: Secondary | ICD-10-CM | POA: Diagnosis not present

## 2016-10-22 DIAGNOSIS — M6281 Muscle weakness (generalized): Secondary | ICD-10-CM | POA: Diagnosis not present

## 2016-10-24 DIAGNOSIS — M6281 Muscle weakness (generalized): Secondary | ICD-10-CM | POA: Diagnosis not present

## 2016-10-24 DIAGNOSIS — Z741 Need for assistance with personal care: Secondary | ICD-10-CM | POA: Diagnosis not present

## 2016-10-24 DIAGNOSIS — R488 Other symbolic dysfunctions: Secondary | ICD-10-CM | POA: Diagnosis not present

## 2016-10-24 DIAGNOSIS — F0390 Unspecified dementia without behavioral disturbance: Secondary | ICD-10-CM | POA: Diagnosis not present

## 2016-10-25 DIAGNOSIS — F0390 Unspecified dementia without behavioral disturbance: Secondary | ICD-10-CM | POA: Diagnosis not present

## 2016-10-25 DIAGNOSIS — R488 Other symbolic dysfunctions: Secondary | ICD-10-CM | POA: Diagnosis not present

## 2016-10-25 DIAGNOSIS — M6281 Muscle weakness (generalized): Secondary | ICD-10-CM | POA: Diagnosis not present

## 2016-10-25 DIAGNOSIS — Z741 Need for assistance with personal care: Secondary | ICD-10-CM | POA: Diagnosis not present

## 2016-10-26 DIAGNOSIS — M6281 Muscle weakness (generalized): Secondary | ICD-10-CM | POA: Diagnosis not present

## 2016-10-26 DIAGNOSIS — F0390 Unspecified dementia without behavioral disturbance: Secondary | ICD-10-CM | POA: Diagnosis not present

## 2016-10-26 DIAGNOSIS — Z741 Need for assistance with personal care: Secondary | ICD-10-CM | POA: Diagnosis not present

## 2016-10-26 DIAGNOSIS — R488 Other symbolic dysfunctions: Secondary | ICD-10-CM | POA: Diagnosis not present

## 2016-10-27 DIAGNOSIS — F0390 Unspecified dementia without behavioral disturbance: Secondary | ICD-10-CM | POA: Diagnosis not present

## 2016-10-27 DIAGNOSIS — M6281 Muscle weakness (generalized): Secondary | ICD-10-CM | POA: Diagnosis not present

## 2016-10-27 DIAGNOSIS — Z741 Need for assistance with personal care: Secondary | ICD-10-CM | POA: Diagnosis not present

## 2016-10-27 DIAGNOSIS — R488 Other symbolic dysfunctions: Secondary | ICD-10-CM | POA: Diagnosis not present

## 2016-10-28 DIAGNOSIS — Z741 Need for assistance with personal care: Secondary | ICD-10-CM | POA: Diagnosis not present

## 2016-10-28 DIAGNOSIS — R488 Other symbolic dysfunctions: Secondary | ICD-10-CM | POA: Diagnosis not present

## 2016-10-28 DIAGNOSIS — M6281 Muscle weakness (generalized): Secondary | ICD-10-CM | POA: Diagnosis not present

## 2016-10-28 DIAGNOSIS — F0390 Unspecified dementia without behavioral disturbance: Secondary | ICD-10-CM | POA: Diagnosis not present

## 2016-10-31 DIAGNOSIS — M6281 Muscle weakness (generalized): Secondary | ICD-10-CM | POA: Diagnosis not present

## 2016-10-31 DIAGNOSIS — Z741 Need for assistance with personal care: Secondary | ICD-10-CM | POA: Diagnosis not present

## 2016-10-31 DIAGNOSIS — F0390 Unspecified dementia without behavioral disturbance: Secondary | ICD-10-CM | POA: Diagnosis not present

## 2016-10-31 DIAGNOSIS — R488 Other symbolic dysfunctions: Secondary | ICD-10-CM | POA: Diagnosis not present

## 2016-11-01 DIAGNOSIS — R488 Other symbolic dysfunctions: Secondary | ICD-10-CM | POA: Diagnosis not present

## 2016-11-01 DIAGNOSIS — M6281 Muscle weakness (generalized): Secondary | ICD-10-CM | POA: Diagnosis not present

## 2016-11-01 DIAGNOSIS — F0390 Unspecified dementia without behavioral disturbance: Secondary | ICD-10-CM | POA: Diagnosis not present

## 2016-11-01 DIAGNOSIS — Z741 Need for assistance with personal care: Secondary | ICD-10-CM | POA: Diagnosis not present

## 2016-11-02 DIAGNOSIS — F0390 Unspecified dementia without behavioral disturbance: Secondary | ICD-10-CM | POA: Diagnosis not present

## 2016-11-02 DIAGNOSIS — Z741 Need for assistance with personal care: Secondary | ICD-10-CM | POA: Diagnosis not present

## 2016-11-02 DIAGNOSIS — R488 Other symbolic dysfunctions: Secondary | ICD-10-CM | POA: Diagnosis not present

## 2016-11-02 DIAGNOSIS — M6281 Muscle weakness (generalized): Secondary | ICD-10-CM | POA: Diagnosis not present

## 2016-11-03 DIAGNOSIS — R488 Other symbolic dysfunctions: Secondary | ICD-10-CM | POA: Diagnosis not present

## 2016-11-03 DIAGNOSIS — F0390 Unspecified dementia without behavioral disturbance: Secondary | ICD-10-CM | POA: Diagnosis not present

## 2016-11-03 DIAGNOSIS — Z741 Need for assistance with personal care: Secondary | ICD-10-CM | POA: Diagnosis not present

## 2016-11-03 DIAGNOSIS — M6281 Muscle weakness (generalized): Secondary | ICD-10-CM | POA: Diagnosis not present

## 2016-11-04 DIAGNOSIS — F0390 Unspecified dementia without behavioral disturbance: Secondary | ICD-10-CM | POA: Diagnosis not present

## 2016-11-04 DIAGNOSIS — M6281 Muscle weakness (generalized): Secondary | ICD-10-CM | POA: Diagnosis not present

## 2016-11-04 DIAGNOSIS — Z741 Need for assistance with personal care: Secondary | ICD-10-CM | POA: Diagnosis not present

## 2016-11-04 DIAGNOSIS — R488 Other symbolic dysfunctions: Secondary | ICD-10-CM | POA: Diagnosis not present

## 2016-11-05 DIAGNOSIS — F0390 Unspecified dementia without behavioral disturbance: Secondary | ICD-10-CM | POA: Diagnosis not present

## 2016-11-05 DIAGNOSIS — Z741 Need for assistance with personal care: Secondary | ICD-10-CM | POA: Diagnosis not present

## 2016-11-05 DIAGNOSIS — M6281 Muscle weakness (generalized): Secondary | ICD-10-CM | POA: Diagnosis not present

## 2016-11-05 DIAGNOSIS — R488 Other symbolic dysfunctions: Secondary | ICD-10-CM | POA: Diagnosis not present

## 2016-11-07 DIAGNOSIS — Z741 Need for assistance with personal care: Secondary | ICD-10-CM | POA: Diagnosis not present

## 2016-11-07 DIAGNOSIS — F0390 Unspecified dementia without behavioral disturbance: Secondary | ICD-10-CM | POA: Diagnosis not present

## 2016-11-07 DIAGNOSIS — R488 Other symbolic dysfunctions: Secondary | ICD-10-CM | POA: Diagnosis not present

## 2016-11-07 DIAGNOSIS — M6281 Muscle weakness (generalized): Secondary | ICD-10-CM | POA: Diagnosis not present

## 2016-11-08 DIAGNOSIS — R488 Other symbolic dysfunctions: Secondary | ICD-10-CM | POA: Diagnosis not present

## 2016-11-08 DIAGNOSIS — M6281 Muscle weakness (generalized): Secondary | ICD-10-CM | POA: Diagnosis not present

## 2016-11-08 DIAGNOSIS — F0390 Unspecified dementia without behavioral disturbance: Secondary | ICD-10-CM | POA: Diagnosis not present

## 2016-11-08 DIAGNOSIS — Z741 Need for assistance with personal care: Secondary | ICD-10-CM | POA: Diagnosis not present

## 2016-11-09 DIAGNOSIS — F0390 Unspecified dementia without behavioral disturbance: Secondary | ICD-10-CM | POA: Diagnosis not present

## 2016-11-09 DIAGNOSIS — Z741 Need for assistance with personal care: Secondary | ICD-10-CM | POA: Diagnosis not present

## 2016-11-09 DIAGNOSIS — M6281 Muscle weakness (generalized): Secondary | ICD-10-CM | POA: Diagnosis not present

## 2016-11-09 DIAGNOSIS — R488 Other symbolic dysfunctions: Secondary | ICD-10-CM | POA: Diagnosis not present

## 2016-11-10 DIAGNOSIS — F0390 Unspecified dementia without behavioral disturbance: Secondary | ICD-10-CM | POA: Diagnosis not present

## 2016-11-10 DIAGNOSIS — Z741 Need for assistance with personal care: Secondary | ICD-10-CM | POA: Diagnosis not present

## 2016-11-10 DIAGNOSIS — M6281 Muscle weakness (generalized): Secondary | ICD-10-CM | POA: Diagnosis not present

## 2016-11-10 DIAGNOSIS — R488 Other symbolic dysfunctions: Secondary | ICD-10-CM | POA: Diagnosis not present

## 2016-11-11 DIAGNOSIS — M79674 Pain in right toe(s): Secondary | ICD-10-CM | POA: Diagnosis not present

## 2016-11-11 DIAGNOSIS — I739 Peripheral vascular disease, unspecified: Secondary | ICD-10-CM | POA: Diagnosis not present

## 2016-11-11 DIAGNOSIS — Z741 Need for assistance with personal care: Secondary | ICD-10-CM | POA: Diagnosis not present

## 2016-11-11 DIAGNOSIS — R488 Other symbolic dysfunctions: Secondary | ICD-10-CM | POA: Diagnosis not present

## 2016-11-11 DIAGNOSIS — F0390 Unspecified dementia without behavioral disturbance: Secondary | ICD-10-CM | POA: Diagnosis not present

## 2016-11-11 DIAGNOSIS — M6281 Muscle weakness (generalized): Secondary | ICD-10-CM | POA: Diagnosis not present

## 2016-11-11 DIAGNOSIS — B351 Tinea unguium: Secondary | ICD-10-CM | POA: Diagnosis not present

## 2016-11-11 DIAGNOSIS — I70203 Unspecified atherosclerosis of native arteries of extremities, bilateral legs: Secondary | ICD-10-CM | POA: Diagnosis not present

## 2016-11-13 DIAGNOSIS — Z741 Need for assistance with personal care: Secondary | ICD-10-CM | POA: Diagnosis not present

## 2016-11-13 DIAGNOSIS — M6281 Muscle weakness (generalized): Secondary | ICD-10-CM | POA: Diagnosis not present

## 2016-11-13 DIAGNOSIS — F0390 Unspecified dementia without behavioral disturbance: Secondary | ICD-10-CM | POA: Diagnosis not present

## 2016-11-13 DIAGNOSIS — R488 Other symbolic dysfunctions: Secondary | ICD-10-CM | POA: Diagnosis not present

## 2016-11-15 DIAGNOSIS — R488 Other symbolic dysfunctions: Secondary | ICD-10-CM | POA: Diagnosis not present

## 2016-11-15 DIAGNOSIS — Z741 Need for assistance with personal care: Secondary | ICD-10-CM | POA: Diagnosis not present

## 2016-11-15 DIAGNOSIS — M6281 Muscle weakness (generalized): Secondary | ICD-10-CM | POA: Diagnosis not present

## 2016-11-15 DIAGNOSIS — F0390 Unspecified dementia without behavioral disturbance: Secondary | ICD-10-CM | POA: Diagnosis not present

## 2016-11-16 DIAGNOSIS — M6281 Muscle weakness (generalized): Secondary | ICD-10-CM | POA: Diagnosis not present

## 2016-11-16 DIAGNOSIS — R488 Other symbolic dysfunctions: Secondary | ICD-10-CM | POA: Diagnosis not present

## 2016-11-16 DIAGNOSIS — Z741 Need for assistance with personal care: Secondary | ICD-10-CM | POA: Diagnosis not present

## 2016-11-16 DIAGNOSIS — F0390 Unspecified dementia without behavioral disturbance: Secondary | ICD-10-CM | POA: Diagnosis not present

## 2016-11-17 DIAGNOSIS — F0390 Unspecified dementia without behavioral disturbance: Secondary | ICD-10-CM | POA: Diagnosis not present

## 2016-11-17 DIAGNOSIS — Z741 Need for assistance with personal care: Secondary | ICD-10-CM | POA: Diagnosis not present

## 2016-11-17 DIAGNOSIS — R488 Other symbolic dysfunctions: Secondary | ICD-10-CM | POA: Diagnosis not present

## 2016-11-17 DIAGNOSIS — M6281 Muscle weakness (generalized): Secondary | ICD-10-CM | POA: Diagnosis not present

## 2016-11-19 DIAGNOSIS — Z741 Need for assistance with personal care: Secondary | ICD-10-CM | POA: Diagnosis not present

## 2016-11-19 DIAGNOSIS — R488 Other symbolic dysfunctions: Secondary | ICD-10-CM | POA: Diagnosis not present

## 2016-11-19 DIAGNOSIS — M6281 Muscle weakness (generalized): Secondary | ICD-10-CM | POA: Diagnosis not present

## 2016-11-19 DIAGNOSIS — F0390 Unspecified dementia without behavioral disturbance: Secondary | ICD-10-CM | POA: Diagnosis not present

## 2016-11-21 DIAGNOSIS — M6281 Muscle weakness (generalized): Secondary | ICD-10-CM | POA: Diagnosis not present

## 2016-11-21 DIAGNOSIS — R488 Other symbolic dysfunctions: Secondary | ICD-10-CM | POA: Diagnosis not present

## 2016-11-21 DIAGNOSIS — Z741 Need for assistance with personal care: Secondary | ICD-10-CM | POA: Diagnosis not present

## 2016-11-21 DIAGNOSIS — F0390 Unspecified dementia without behavioral disturbance: Secondary | ICD-10-CM | POA: Diagnosis not present

## 2016-11-22 DIAGNOSIS — M6281 Muscle weakness (generalized): Secondary | ICD-10-CM | POA: Diagnosis not present

## 2016-11-22 DIAGNOSIS — R488 Other symbolic dysfunctions: Secondary | ICD-10-CM | POA: Diagnosis not present

## 2016-11-22 DIAGNOSIS — F0390 Unspecified dementia without behavioral disturbance: Secondary | ICD-10-CM | POA: Diagnosis not present

## 2016-11-22 DIAGNOSIS — Z741 Need for assistance with personal care: Secondary | ICD-10-CM | POA: Diagnosis not present

## 2016-11-23 DIAGNOSIS — Z741 Need for assistance with personal care: Secondary | ICD-10-CM | POA: Diagnosis not present

## 2016-11-23 DIAGNOSIS — R488 Other symbolic dysfunctions: Secondary | ICD-10-CM | POA: Diagnosis not present

## 2016-11-23 DIAGNOSIS — F0390 Unspecified dementia without behavioral disturbance: Secondary | ICD-10-CM | POA: Diagnosis not present

## 2016-11-23 DIAGNOSIS — M6281 Muscle weakness (generalized): Secondary | ICD-10-CM | POA: Diagnosis not present

## 2016-11-25 ENCOUNTER — Telehealth: Payer: Self-pay | Admitting: Family Medicine

## 2016-11-25 ENCOUNTER — Encounter: Payer: Self-pay | Admitting: Family Medicine

## 2016-11-25 DIAGNOSIS — F0390 Unspecified dementia without behavioral disturbance: Secondary | ICD-10-CM | POA: Diagnosis not present

## 2016-11-25 DIAGNOSIS — R488 Other symbolic dysfunctions: Secondary | ICD-10-CM | POA: Diagnosis not present

## 2016-11-25 DIAGNOSIS — M21969 Unspecified acquired deformity of unspecified lower leg: Secondary | ICD-10-CM | POA: Insufficient documentation

## 2016-11-25 DIAGNOSIS — Z741 Need for assistance with personal care: Secondary | ICD-10-CM | POA: Diagnosis not present

## 2016-11-25 DIAGNOSIS — M6281 Muscle weakness (generalized): Secondary | ICD-10-CM | POA: Diagnosis not present

## 2016-11-25 NOTE — Telephone Encounter (Signed)
[facility].  Patient's toes are crossing.  Please send a written order for diabetic shoes to fax [phone] ATTN: Debbie.  They need the order today because Eunice BlaseDebbie [facility] and the family are very eager for patient to get a new pair of diabetic shoes.

## 2016-11-25 NOTE — Telephone Encounter (Signed)
Order faxed.

## 2016-11-25 NOTE — Telephone Encounter (Signed)
Done and in IN box 

## 2016-11-28 DIAGNOSIS — R488 Other symbolic dysfunctions: Secondary | ICD-10-CM | POA: Diagnosis not present

## 2016-11-28 DIAGNOSIS — Z741 Need for assistance with personal care: Secondary | ICD-10-CM | POA: Diagnosis not present

## 2016-11-28 DIAGNOSIS — F0390 Unspecified dementia without behavioral disturbance: Secondary | ICD-10-CM | POA: Diagnosis not present

## 2016-11-28 DIAGNOSIS — M6281 Muscle weakness (generalized): Secondary | ICD-10-CM | POA: Diagnosis not present

## 2016-11-29 DIAGNOSIS — R488 Other symbolic dysfunctions: Secondary | ICD-10-CM | POA: Diagnosis not present

## 2016-11-29 DIAGNOSIS — Z741 Need for assistance with personal care: Secondary | ICD-10-CM | POA: Diagnosis not present

## 2016-11-29 DIAGNOSIS — M6281 Muscle weakness (generalized): Secondary | ICD-10-CM | POA: Diagnosis not present

## 2016-11-29 DIAGNOSIS — F0390 Unspecified dementia without behavioral disturbance: Secondary | ICD-10-CM | POA: Diagnosis not present

## 2016-11-30 DIAGNOSIS — M6281 Muscle weakness (generalized): Secondary | ICD-10-CM | POA: Diagnosis not present

## 2016-11-30 DIAGNOSIS — F0390 Unspecified dementia without behavioral disturbance: Secondary | ICD-10-CM | POA: Diagnosis not present

## 2016-11-30 DIAGNOSIS — R488 Other symbolic dysfunctions: Secondary | ICD-10-CM | POA: Diagnosis not present

## 2016-11-30 DIAGNOSIS — Z741 Need for assistance with personal care: Secondary | ICD-10-CM | POA: Diagnosis not present

## 2016-12-01 DIAGNOSIS — Z741 Need for assistance with personal care: Secondary | ICD-10-CM | POA: Diagnosis not present

## 2016-12-01 DIAGNOSIS — R488 Other symbolic dysfunctions: Secondary | ICD-10-CM | POA: Diagnosis not present

## 2016-12-01 DIAGNOSIS — F0390 Unspecified dementia without behavioral disturbance: Secondary | ICD-10-CM | POA: Diagnosis not present

## 2016-12-01 DIAGNOSIS — M6281 Muscle weakness (generalized): Secondary | ICD-10-CM | POA: Diagnosis not present

## 2016-12-02 DIAGNOSIS — M6281 Muscle weakness (generalized): Secondary | ICD-10-CM | POA: Diagnosis not present

## 2016-12-02 DIAGNOSIS — F0390 Unspecified dementia without behavioral disturbance: Secondary | ICD-10-CM | POA: Diagnosis not present

## 2016-12-02 DIAGNOSIS — R488 Other symbolic dysfunctions: Secondary | ICD-10-CM | POA: Diagnosis not present

## 2016-12-02 DIAGNOSIS — Z741 Need for assistance with personal care: Secondary | ICD-10-CM | POA: Diagnosis not present

## 2016-12-05 DIAGNOSIS — F0390 Unspecified dementia without behavioral disturbance: Secondary | ICD-10-CM | POA: Diagnosis not present

## 2016-12-05 DIAGNOSIS — Z741 Need for assistance with personal care: Secondary | ICD-10-CM | POA: Diagnosis not present

## 2016-12-05 DIAGNOSIS — R488 Other symbolic dysfunctions: Secondary | ICD-10-CM | POA: Diagnosis not present

## 2016-12-05 DIAGNOSIS — M6281 Muscle weakness (generalized): Secondary | ICD-10-CM | POA: Diagnosis not present

## 2016-12-06 DIAGNOSIS — F0390 Unspecified dementia without behavioral disturbance: Secondary | ICD-10-CM | POA: Diagnosis not present

## 2016-12-06 DIAGNOSIS — R488 Other symbolic dysfunctions: Secondary | ICD-10-CM | POA: Diagnosis not present

## 2016-12-06 DIAGNOSIS — M6281 Muscle weakness (generalized): Secondary | ICD-10-CM | POA: Diagnosis not present

## 2016-12-06 DIAGNOSIS — Z741 Need for assistance with personal care: Secondary | ICD-10-CM | POA: Diagnosis not present

## 2016-12-09 DIAGNOSIS — R488 Other symbolic dysfunctions: Secondary | ICD-10-CM | POA: Diagnosis not present

## 2016-12-09 DIAGNOSIS — F0390 Unspecified dementia without behavioral disturbance: Secondary | ICD-10-CM | POA: Diagnosis not present

## 2016-12-09 DIAGNOSIS — M6281 Muscle weakness (generalized): Secondary | ICD-10-CM | POA: Diagnosis not present

## 2016-12-09 DIAGNOSIS — Z741 Need for assistance with personal care: Secondary | ICD-10-CM | POA: Diagnosis not present

## 2016-12-10 DIAGNOSIS — F0390 Unspecified dementia without behavioral disturbance: Secondary | ICD-10-CM | POA: Diagnosis not present

## 2016-12-10 DIAGNOSIS — Z741 Need for assistance with personal care: Secondary | ICD-10-CM | POA: Diagnosis not present

## 2016-12-10 DIAGNOSIS — R488 Other symbolic dysfunctions: Secondary | ICD-10-CM | POA: Diagnosis not present

## 2016-12-10 DIAGNOSIS — M6281 Muscle weakness (generalized): Secondary | ICD-10-CM | POA: Diagnosis not present

## 2016-12-12 DIAGNOSIS — F0390 Unspecified dementia without behavioral disturbance: Secondary | ICD-10-CM | POA: Diagnosis not present

## 2016-12-12 DIAGNOSIS — R488 Other symbolic dysfunctions: Secondary | ICD-10-CM | POA: Diagnosis not present

## 2016-12-12 DIAGNOSIS — M6281 Muscle weakness (generalized): Secondary | ICD-10-CM | POA: Diagnosis not present

## 2016-12-12 DIAGNOSIS — Z741 Need for assistance with personal care: Secondary | ICD-10-CM | POA: Diagnosis not present

## 2016-12-13 DIAGNOSIS — Z741 Need for assistance with personal care: Secondary | ICD-10-CM | POA: Diagnosis not present

## 2016-12-13 DIAGNOSIS — R488 Other symbolic dysfunctions: Secondary | ICD-10-CM | POA: Diagnosis not present

## 2016-12-13 DIAGNOSIS — F0390 Unspecified dementia without behavioral disturbance: Secondary | ICD-10-CM | POA: Diagnosis not present

## 2016-12-13 DIAGNOSIS — M6281 Muscle weakness (generalized): Secondary | ICD-10-CM | POA: Diagnosis not present

## 2016-12-14 ENCOUNTER — Telehealth: Payer: Self-pay

## 2016-12-14 DIAGNOSIS — M6281 Muscle weakness (generalized): Secondary | ICD-10-CM | POA: Diagnosis not present

## 2016-12-14 DIAGNOSIS — Z741 Need for assistance with personal care: Secondary | ICD-10-CM | POA: Diagnosis not present

## 2016-12-14 DIAGNOSIS — F0390 Unspecified dementia without behavioral disturbance: Secondary | ICD-10-CM | POA: Diagnosis not present

## 2016-12-14 DIAGNOSIS — R488 Other symbolic dysfunctions: Secondary | ICD-10-CM | POA: Diagnosis not present

## 2016-12-14 MED ORDER — FLUCONAZOLE 150 MG PO TABS: 150.0000 mg | ORAL_TABLET | Freq: Once | ORAL | 0 refills | Status: AC

## 2016-12-14 MED ORDER — NYSTATIN 100000 UNIT/GM EX POWD: Freq: Four times a day (QID) | CUTANEOUS | 3 refills | Status: DC

## 2016-12-14 NOTE — Telephone Encounter (Signed)
Printed diflucan to fax Please let me know if no imp

## 2016-12-14 NOTE — Telephone Encounter (Signed)
Armandina Stammerebbie Heaton [facility]; Nystatin powder is not helping yeast infection; pt is very raw in perineal and groin areas; request order for diflucan [facility]ed to [facility] fax # [phone] atten: Eunice Blaseebbie. Pt last seen 09/26/16 for annual exam.

## 2016-12-14 NOTE — Telephone Encounter (Signed)
Diflucan Rx faxed as well to [phone].

## 2016-12-14 NOTE — Telephone Encounter (Signed)
Printed mycostatin powder to fax

## 2016-12-14 NOTE — Telephone Encounter (Signed)
[facility] at [phone].

## 2016-12-15 DIAGNOSIS — F0390 Unspecified dementia without behavioral disturbance: Secondary | ICD-10-CM | POA: Diagnosis not present

## 2016-12-15 DIAGNOSIS — M6281 Muscle weakness (generalized): Secondary | ICD-10-CM | POA: Diagnosis not present

## 2016-12-15 DIAGNOSIS — R488 Other symbolic dysfunctions: Secondary | ICD-10-CM | POA: Diagnosis not present

## 2016-12-15 DIAGNOSIS — Z741 Need for assistance with personal care: Secondary | ICD-10-CM | POA: Diagnosis not present

## 2016-12-19 DIAGNOSIS — Z741 Need for assistance with personal care: Secondary | ICD-10-CM | POA: Diagnosis not present

## 2016-12-19 DIAGNOSIS — F0390 Unspecified dementia without behavioral disturbance: Secondary | ICD-10-CM | POA: Diagnosis not present

## 2016-12-19 DIAGNOSIS — M6281 Muscle weakness (generalized): Secondary | ICD-10-CM | POA: Diagnosis not present

## 2016-12-19 DIAGNOSIS — R488 Other symbolic dysfunctions: Secondary | ICD-10-CM | POA: Diagnosis not present

## 2016-12-21 DIAGNOSIS — R488 Other symbolic dysfunctions: Secondary | ICD-10-CM | POA: Diagnosis not present

## 2016-12-21 DIAGNOSIS — F0390 Unspecified dementia without behavioral disturbance: Secondary | ICD-10-CM | POA: Diagnosis not present

## 2016-12-21 DIAGNOSIS — Z741 Need for assistance with personal care: Secondary | ICD-10-CM | POA: Diagnosis not present

## 2016-12-21 DIAGNOSIS — M6281 Muscle weakness (generalized): Secondary | ICD-10-CM | POA: Diagnosis not present

## 2016-12-23 DIAGNOSIS — F0390 Unspecified dementia without behavioral disturbance: Secondary | ICD-10-CM | POA: Diagnosis not present

## 2016-12-23 DIAGNOSIS — R488 Other symbolic dysfunctions: Secondary | ICD-10-CM | POA: Diagnosis not present

## 2016-12-23 DIAGNOSIS — Z741 Need for assistance with personal care: Secondary | ICD-10-CM | POA: Diagnosis not present

## 2016-12-23 DIAGNOSIS — M6281 Muscle weakness (generalized): Secondary | ICD-10-CM | POA: Diagnosis not present

## 2016-12-24 DIAGNOSIS — F0390 Unspecified dementia without behavioral disturbance: Secondary | ICD-10-CM | POA: Diagnosis not present

## 2016-12-24 DIAGNOSIS — R488 Other symbolic dysfunctions: Secondary | ICD-10-CM | POA: Diagnosis not present

## 2016-12-24 DIAGNOSIS — M6281 Muscle weakness (generalized): Secondary | ICD-10-CM | POA: Diagnosis not present

## 2016-12-24 DIAGNOSIS — Z741 Need for assistance with personal care: Secondary | ICD-10-CM | POA: Diagnosis not present

## 2016-12-26 DIAGNOSIS — Z741 Need for assistance with personal care: Secondary | ICD-10-CM | POA: Diagnosis not present

## 2016-12-26 DIAGNOSIS — R488 Other symbolic dysfunctions: Secondary | ICD-10-CM | POA: Diagnosis not present

## 2016-12-26 DIAGNOSIS — M6281 Muscle weakness (generalized): Secondary | ICD-10-CM | POA: Diagnosis not present

## 2016-12-26 DIAGNOSIS — F0390 Unspecified dementia without behavioral disturbance: Secondary | ICD-10-CM | POA: Diagnosis not present

## 2016-12-27 DIAGNOSIS — R488 Other symbolic dysfunctions: Secondary | ICD-10-CM | POA: Diagnosis not present

## 2016-12-27 DIAGNOSIS — Z741 Need for assistance with personal care: Secondary | ICD-10-CM | POA: Diagnosis not present

## 2016-12-27 DIAGNOSIS — F0390 Unspecified dementia without behavioral disturbance: Secondary | ICD-10-CM | POA: Diagnosis not present

## 2016-12-27 DIAGNOSIS — M6281 Muscle weakness (generalized): Secondary | ICD-10-CM | POA: Diagnosis not present

## 2016-12-28 DIAGNOSIS — R488 Other symbolic dysfunctions: Secondary | ICD-10-CM | POA: Diagnosis not present

## 2016-12-28 DIAGNOSIS — M6281 Muscle weakness (generalized): Secondary | ICD-10-CM | POA: Diagnosis not present

## 2016-12-28 DIAGNOSIS — F0390 Unspecified dementia without behavioral disturbance: Secondary | ICD-10-CM | POA: Diagnosis not present

## 2016-12-28 DIAGNOSIS — Z741 Need for assistance with personal care: Secondary | ICD-10-CM | POA: Diagnosis not present

## 2016-12-29 DIAGNOSIS — Z741 Need for assistance with personal care: Secondary | ICD-10-CM | POA: Diagnosis not present

## 2016-12-29 DIAGNOSIS — R488 Other symbolic dysfunctions: Secondary | ICD-10-CM | POA: Diagnosis not present

## 2016-12-29 DIAGNOSIS — F0390 Unspecified dementia without behavioral disturbance: Secondary | ICD-10-CM | POA: Diagnosis not present

## 2016-12-29 DIAGNOSIS — M6281 Muscle weakness (generalized): Secondary | ICD-10-CM | POA: Diagnosis not present

## 2016-12-30 DIAGNOSIS — M6281 Muscle weakness (generalized): Secondary | ICD-10-CM | POA: Diagnosis not present

## 2016-12-30 DIAGNOSIS — R488 Other symbolic dysfunctions: Secondary | ICD-10-CM | POA: Diagnosis not present

## 2016-12-30 DIAGNOSIS — F0390 Unspecified dementia without behavioral disturbance: Secondary | ICD-10-CM | POA: Diagnosis not present

## 2016-12-30 DIAGNOSIS — Z741 Need for assistance with personal care: Secondary | ICD-10-CM | POA: Diagnosis not present

## 2017-01-02 DIAGNOSIS — M6281 Muscle weakness (generalized): Secondary | ICD-10-CM | POA: Diagnosis not present

## 2017-01-02 DIAGNOSIS — R488 Other symbolic dysfunctions: Secondary | ICD-10-CM | POA: Diagnosis not present

## 2017-01-02 DIAGNOSIS — F0390 Unspecified dementia without behavioral disturbance: Secondary | ICD-10-CM | POA: Diagnosis not present

## 2017-01-02 DIAGNOSIS — Z741 Need for assistance with personal care: Secondary | ICD-10-CM | POA: Diagnosis not present

## 2017-01-03 ENCOUNTER — Telehealth: Payer: Self-pay | Admitting: Family Medicine

## 2017-01-03 DIAGNOSIS — F0390 Unspecified dementia without behavioral disturbance: Secondary | ICD-10-CM | POA: Diagnosis not present

## 2017-01-03 DIAGNOSIS — J984 Other disorders of lung: Secondary | ICD-10-CM | POA: Diagnosis not present

## 2017-01-03 DIAGNOSIS — Z741 Need for assistance with personal care: Secondary | ICD-10-CM | POA: Diagnosis not present

## 2017-01-03 DIAGNOSIS — R488 Other symbolic dysfunctions: Secondary | ICD-10-CM | POA: Diagnosis not present

## 2017-01-03 DIAGNOSIS — M6281 Muscle weakness (generalized): Secondary | ICD-10-CM | POA: Diagnosis not present

## 2017-01-03 MED ORDER — FLUCONAZOLE 150 MG PO TABS: 150.0000 mg | ORAL_TABLET | Freq: Once | ORAL | 0 refills | Status: AC

## 2017-01-03 NOTE — Telephone Encounter (Signed)
[facility] and spoke to on call Nurse, order for diflucan faxed to them and they will give it to the pt.   They said they do have a mobile xray there they just need an order faxed to them on Rx paper and they can get the xray done. Please write order and I will fax it, thanks

## 2017-01-03 NOTE — Telephone Encounter (Signed)
I put order in IN box Thanks

## 2017-01-03 NOTE — Telephone Encounter (Signed)
Order faxed.

## 2017-01-03 NOTE — Telephone Encounter (Signed)
[facility] pt's am meds out and they requested I call her back in about 30 min

## 2017-01-03 NOTE — Telephone Encounter (Signed)
[facility] requesting another dose of diflucan for intertrigo  Also mentioned that she is wheezine/coughing and they are interested in a chest xray   Please call and ask- does that need to be done here or do they have portable xray?   Fax in IN box  I printed the diflucan to fax

## 2017-01-04 ENCOUNTER — Telehealth: Payer: Self-pay | Admitting: Family Medicine

## 2017-01-04 DIAGNOSIS — F0390 Unspecified dementia without behavioral disturbance: Secondary | ICD-10-CM | POA: Diagnosis not present

## 2017-01-04 DIAGNOSIS — R488 Other symbolic dysfunctions: Secondary | ICD-10-CM | POA: Diagnosis not present

## 2017-01-04 DIAGNOSIS — M6281 Muscle weakness (generalized): Secondary | ICD-10-CM | POA: Diagnosis not present

## 2017-01-04 DIAGNOSIS — Z741 Need for assistance with personal care: Secondary | ICD-10-CM | POA: Diagnosis not present

## 2017-01-04 MED ORDER — LEVOFLOXACIN 500 MG PO TABS: 500.0000 mg | ORAL_TABLET | Freq: Every day | ORAL | 0 refills | Status: DC

## 2017-01-04 NOTE — Telephone Encounter (Signed)
Spoke with nurse on call and advise her of Dr. Royden Purlower's comments/instructions. Nurse said she just got there so she isn't sure how pt is doing but she didn't get any bad reports on her when she got there. She will check on her and make sure she doesn't get hypoxic or febrile. Rx faxed and they will start her on abx. I was xfered to transportation and scheduled a f/u with them for Friday 01/13/17

## 2017-01-04 NOTE — Telephone Encounter (Signed)
I rec the cxr report showing R basilar airspace disease - could be infiltrate or atelectasis  I want to cover with abx  levaquin printed  Please get an update re: how she is feeling Please f/u with me next week  Alert if hypoxic or or febrile or worse please

## 2017-01-05 DIAGNOSIS — R488 Other symbolic dysfunctions: Secondary | ICD-10-CM | POA: Diagnosis not present

## 2017-01-05 DIAGNOSIS — M6281 Muscle weakness (generalized): Secondary | ICD-10-CM | POA: Diagnosis not present

## 2017-01-05 DIAGNOSIS — F0390 Unspecified dementia without behavioral disturbance: Secondary | ICD-10-CM | POA: Diagnosis not present

## 2017-01-05 DIAGNOSIS — Z741 Need for assistance with personal care: Secondary | ICD-10-CM | POA: Diagnosis not present

## 2017-01-13 ENCOUNTER — Ambulatory Visit (INDEPENDENT_AMBULATORY_CARE_PROVIDER_SITE_OTHER): Payer: Medicare Other | Admitting: Family Medicine

## 2017-01-13 ENCOUNTER — Encounter: Payer: Self-pay | Admitting: Family Medicine

## 2017-01-13 VITALS — BP 124/70 | HR 62 | Temp 97.8°F | Ht 60.5 in | Wt 230.8 lb

## 2017-01-13 DIAGNOSIS — I10 Essential (primary) hypertension: Secondary | ICD-10-CM | POA: Diagnosis not present

## 2017-01-13 DIAGNOSIS — B372 Candidiasis of skin and nail: Secondary | ICD-10-CM | POA: Diagnosis not present

## 2017-01-13 DIAGNOSIS — J181 Lobar pneumonia, unspecified organism: Secondary | ICD-10-CM | POA: Diagnosis not present

## 2017-01-13 DIAGNOSIS — I728 Aneurysm of other specified arteries: Secondary | ICD-10-CM | POA: Diagnosis not present

## 2017-01-13 DIAGNOSIS — J189 Pneumonia, unspecified organism: Secondary | ICD-10-CM | POA: Insufficient documentation

## 2017-01-13 MED ORDER — LEVOFLOXACIN 500 MG PO TABS: 500.0000 mg | ORAL_TABLET | Freq: Every day | ORAL | 0 refills | Status: DC

## 2017-01-13 NOTE — Assessment & Plan Note (Signed)
R basilar airspace finding on xray -? Infiltrate vs atelectasis  Treated with levquin (day 7) - will extend 5 days  Some improvement per staff Vitals are stable and pulse ox after ambulation is re assuring at 92% on RA Exam is also re assuring-no wheeze or sob  Will alert if changes Plan on cxr in 2 weeks for re check

## 2017-01-13 NOTE — Assessment & Plan Note (Signed)
bp in fair control at this time  BP Readings from Last 1 Encounters:  01/13/17 124/70   No changes needed Disc lifstyle change with low sodium diet and exercise  Labs reviewed from fall

## 2017-01-13 NOTE — Assessment & Plan Note (Signed)
In groin area moreso on R than L  Appears to be healing with a scab just lateral to mons on the R that is not fluctuant /no pus or drainage  Adv continue to tx with nystatin powder and keep clean and dry levaquin may worsen yeast but tx any bacterial inf

## 2017-01-13 NOTE — Progress Notes (Signed)
Pre visit review using our clinic review tool, if applicable. No additional management support is needed unless otherwise documented below in the visit note. 

## 2017-01-13 NOTE — Assessment & Plan Note (Signed)
No symptoms 

## 2017-01-13 NOTE — Patient Instructions (Signed)
For suspected pneumonia of R lung-continue levaquin for 5 more days  This is also helping the boil The fungal skin infection looks to be improving-keep using the mycostatin powder indefinitely and try to keep the area dry  Alert me if worse or not further improvement in a week please

## 2017-01-13 NOTE — Progress Notes (Signed)
Subjective:    Patient ID: Margaret Hicks, female    DOB: 1932/02/08, 83 y.o.   MRN: [id number]  HPI Here for cough and upper respiratory symptoms   She has dementia and baseline morbid obesity and renal insufficiency  Her nursing home had called [facility] asst living) and cxr ordered 2/14 Result showed R basilar airspace dz - pos infiltrate vs atelectasis We started her on levaquin  Wt Readings from Last 3 Encounters:  01/13/17 230 lb 12 oz (104.7 kg)  09/26/16 227 lb 4 oz (103.1 kg)  09/16/16 230 lb (104.3 kg)   BP Readings from Last 3 Encounters:  01/13/17 124/70  09/26/16 124/82  09/16/16 114/68   Pulse Readings from Last 3 Encounters:  01/13/17 62  09/26/16 (!) 58  09/16/16 (!) 57   Coughing/ junky sounding - suspect it is productive  No fever known  No side effects from the antibiotic   Also has been fighting a skin infection in pubic area and now has a boil   Dementia is stable -not more confused   Patient Active Problem List   Diagnosis Date Noted  . CAP (community acquired pneumonia) 01/13/2017  . Candidal intertrigo 01/13/2017  . Foot deformity, acquired 11/25/2016  . Screening mammogram, encounter for 09/26/2016  . Morbid obesity (HCC) 09/26/2016  . Dehydration 12/17/2014  . Mental status change 12/17/2014  . Mobility impaired 09/17/2014  . Encounter for Medicare annual wellness exam 09/01/2014  . Internal hemorrhoids 08/27/2014  . Pedal edema 08/12/2013  . Dysphagia, unspecified(787.20) 05/29/2013  . Aneurysm, splenic artery (HCC) 05/29/2013  . Senile dementia, uncomplicated 01/09/2013  . Back pain, thoracic 11/28/2012  . Depression 11/28/2012  . Other screening mammogram 11/28/2012  . EXTERNAL HEMORRHOIDS 01/26/2011  . GERD 09/01/2010  . Hypothyroidism 05/27/2010  . Hyperlipidemia LDL goal <100 05/27/2010  . Essential hypertension 05/27/2010  . Mild renal insufficiency 05/27/2010  . Unspecified urinary incontinence 05/27/2010  .  Personal history of urinary disorder 05/27/2010   Past Medical History:  Diagnosis Date  . Acute gastritis   . Arthritis   . Back pain   . Dementia   . Esophageal reflux   . GERD (gastroesophageal reflux disease)   . Hemorrhoids   . Hyperlipemia   . Hypertension   . Hypothyroidism   . Obesity   . OP (osteoporosis)   . Renal insufficiency   . Schatzki's ring   . TIA (transient ischemic attack)   . Upper GI bleed   . Urinary incontinence    Past Surgical History:  Procedure Laterality Date  . ABDOMINAL HYSTERECTOMY    . CARDIAC CATHETERIZATION    . CHOLECYSTECTOMY    . TONSILLECTOMY     Social History  Substance Use Topics  . Smoking status: Never Smoker  . Smokeless tobacco: Never Used  . Alcohol use No   Family History  Problem Relation Age of Onset  . Dementia Mother   . Stroke Mother   . Dementia Brother   . Dementia Brother   . Arthritis Sister   . Cancer      nephew  . Heart disease Father   . Colon cancer Neg Hx    Allergies  Allergen Reactions  . Simvastatin     REACTION: muscle pain/ memory issues   Current Outpatient Prescriptions on File Prior to Visit  Medication Sig Dispense Refill  . acetaminophen (TYLENOL) 500 MG tablet Take 500 mg by mouth every 8 (eight) hours as needed for moderate pain or headache.    Marland Kitchen  Alum & Mag Hydroxide-Simeth (GERI-LANTA PO) Take 30 mLs by mouth 4 (four) times daily as needed.    . bismuth subsalicylate (PEPTO BISMOL) 262 MG/15ML suspension Take 10 mLs by mouth every 6 (six) hours as needed.    . cholecalciferol (VITAMIN D) 1000 UNITS tablet Take 1,000 Units by mouth every evening.     Marland Kitchen. Dextromethorphan HBr (ROBAFEN COUGH PO) Take by mouth.    . lansoprazole (PREVACID) 15 MG capsule Take 1 capsule (15 mg total) by mouth daily at 12 noon. 90 capsule 3  . levothyroxine (SYNTHROID, LEVOTHROID) 100 MCG tablet Take 1 tablet (100 mcg total) by mouth daily before breakfast. 90 tablet 3  . magnesium hydroxide (MILK OF  MAGNESIA) 400 MG/5ML suspension Take 30 mLs by mouth daily as needed for mild constipation.    . metoprolol (LOPRESSOR) 100 MG tablet Take 0.5 tablets (50 mg total) by mouth 2 (two) times daily. 90 tablet 3  . Multiple Vitamin (MULTIVITAMIN) capsule Take 1 capsule by mouth daily.      Marland Kitchen. nystatin (MYCOSTATIN/NYSTOP) powder Apply topically 4 (four) times daily. To affected areas under breasts 30 g 2  . nystatin (MYCOSTATIN/NYSTOP) powder Apply topically 4 (four) times daily. To affected areas 30 g 3  . sertraline (ZOLOFT) 50 MG tablet Take 1 tablet (50 mg total) by mouth daily. 90 tablet 3  . spironolactone (ALDACTONE) 25 MG tablet Take 0.5 tablets (12.5 mg total) by mouth daily. 45 tablet 3  . valsartan (DIOVAN) 320 MG tablet Take 1 tablet (320 mg total) by mouth daily. 90 tablet 3   No current facility-administered medications on file prior to visit.     Review of Systems    Review of Systems  Constitutional: Negative for fever, appetite change, fatigue and unexpected weight change.  Eyes: Negative for pain and visual disturbance.  Respiratory: Negative for  shortness of breath.  pos for junky cough, occ wheeze  Cardiovascular: Negative for cp or palpitations    Gastrointestinal: Negative for nausea, diarrhea and constipation.  Genitourinary: Negative for urgency and frequency.  Skin: Negative for pallor or rash   Neurological: Negative for weakness, light-headedness, numbness and headaches.  Hematological: Negative for adenopathy. Does not bruise/bleed easily.  Psychiatric/Behavioral: Negative for dysphoric mood. The patient is not nervous/anxious.  pos for dementia and baseline confusion w/o agitation     Objective:   Physical Exam  Constitutional: She appears well-developed and well-nourished. No distress.  Obese elderly female with dementia -requires assistance to stand and walk (baseline) Not distressed or sob  HENT:  Head: Normocephalic and atraumatic.  Mouth/Throat: Oropharynx  is clear and moist.  Eyes: Conjunctivae and EOM are normal. Pupils are equal, round, and reactive to light.  Neck: Normal range of motion. Neck supple. No JVD present. Carotid bruit is not present. No thyromegaly present.  Cardiovascular: Normal rate, regular rhythm, normal heart sounds and intact distal pulses.  Exam reveals no gallop.   Pulmonary/Chest: Effort normal and breath sounds normal. No respiratory distress. She has no wheezes. She has no rales. She exhibits no tenderness.  No crackles  bs are harsh and more distant in R base Otherwise clear Rare scattered rhonchi Some upper airway sounds   Cough is junky   Abdominal: Soft. Bowel sounds are normal. She exhibits no distension, no abdominal bruit and no mass. There is no tenderness.  Musculoskeletal: She exhibits no edema.  Lymphadenopathy:    She has no cervical adenopathy.  Neurological: She is alert. She has normal reflexes.  Skin: Skin is warm and dry. No rash noted.  Sharply demarcated and mild erythema in under pannus /skin fold to thigh Healed scab 1 cm on rhe R looks like a healed blister or cyst  Not tender    Psychiatric: She has a normal mood and affect.          Assessment & Plan:   Problem List Items Addressed This Visit      Cardiovascular and Mediastinum   Aneurysm, splenic artery (HCC)    No symptoms      Essential hypertension - Primary    bp in fair control at this time  BP Readings from Last 1 Encounters:  01/13/17 124/70   No changes needed Disc lifstyle change with low sodium diet and exercise  Labs reviewed from fall        Respiratory   CAP (community acquired pneumonia)    R basilar airspace finding on xray -? Infiltrate vs atelectasis  Treated with levquin (day 7) - will extend 5 Hicks  Some improvement per staff Vitals are stable and pulse ox after ambulation is re assuring at 92% on RA Exam is also re assuring-no wheeze or sob  Will alert if changes Plan on cxr in 2 weeks  for re check      Relevant Medications   guaiFENesin (TUSSIN) 100 MG/5ML liquid   levofloxacin (LEVAQUIN) 500 MG tablet     Musculoskeletal and Integument   Candidal intertrigo    In groin area moreso on R than L  Appears to be healing with a scab just lateral to mons on the R that is not fluctuant /no pus or drainage  Adv continue to tx with nystatin powder and keep clean and dry levaquin may worsen yeast but tx any bacterial inf       Relevant Medications   Neomycin-Bacitracin-Polymyxin (TRIPLE ANTIBIOTIC EX)     Other   Morbid obesity (HCC)    Difficult to control with adv age/ mobility imp and dementia  Disc food choices for meals and dec portion size

## 2017-01-15 NOTE — Assessment & Plan Note (Signed)
Difficult to control with adv age/ mobility imp and dementia  Disc food choices for meals and dec portion size

## 2017-01-27 ENCOUNTER — Encounter: Payer: Self-pay | Admitting: Family Medicine

## 2017-01-27 DIAGNOSIS — I517 Cardiomegaly: Secondary | ICD-10-CM | POA: Diagnosis not present

## 2017-02-14 DIAGNOSIS — B351 Tinea unguium: Secondary | ICD-10-CM | POA: Diagnosis not present

## 2017-02-14 DIAGNOSIS — I70203 Unspecified atherosclerosis of native arteries of extremities, bilateral legs: Secondary | ICD-10-CM | POA: Diagnosis not present

## 2017-06-09 DIAGNOSIS — L603 Nail dystrophy: Secondary | ICD-10-CM | POA: Diagnosis not present

## 2017-06-09 DIAGNOSIS — B351 Tinea unguium: Secondary | ICD-10-CM | POA: Diagnosis not present

## 2017-06-09 DIAGNOSIS — I739 Peripheral vascular disease, unspecified: Secondary | ICD-10-CM | POA: Diagnosis not present

## 2017-06-09 DIAGNOSIS — L84 Corns and callosities: Secondary | ICD-10-CM | POA: Diagnosis not present

## 2017-07-11 ENCOUNTER — Telehealth: Payer: Self-pay

## 2017-07-11 MED ORDER — LOSARTAN POTASSIUM 100 MG PO TABS: 100.0000 mg | ORAL_TABLET | Freq: Every day | ORAL | 11 refills | Status: DC

## 2017-07-11 NOTE — Telephone Encounter (Signed)
Pt's daughter left a message on triage line stating they received a letter from [facility] that the pt should discuss changing her Valsartan due to the voluntary recall. Please advise.

## 2017-07-11 NOTE — Telephone Encounter (Signed)
I replaced it with losartan 100  Let me know if any problems

## 2017-07-11 NOTE — Telephone Encounter (Signed)
Check her bp daily for a week and then if doing well once weekly is fine Thanks

## 2017-07-11 NOTE — Telephone Encounter (Signed)
Daughter notified Rx sent, she states that pt's assisted Living [facility] in [place]) will need to be advise to change Rx, I can call and advise them to change Rx but daughter also wanted [place] to tell them to check pt's BP more frequently since she is having a med, change, is it okay to advise assisted living of this and if so how often do you want her BP checked?

## 2017-07-13 NOTE — Telephone Encounter (Signed)
Lorie with [employer] called and she had the wrong pt regarding the B12 issue she said this pt isn't on b12, so she will follow through with the order to change BP med but disregard her message about the B12

## 2017-07-13 NOTE — Telephone Encounter (Signed)
Verbal order given to nurse to d/c the valsartan and start the losartan and the will check BP once daily for a week.   The nurse did want me to let Dr. Milinda Antis know that pt has been refusing her B12 tab. She says it upsets her stomach so she hasn't been on it for over a month they just want to make sure that's okay with you that they d/c med due to pt refusing it. Med not on pt's med list either

## 2017-08-06 DIAGNOSIS — R278 Other lack of coordination: Secondary | ICD-10-CM | POA: Diagnosis not present

## 2017-08-06 DIAGNOSIS — M6281 Muscle weakness (generalized): Secondary | ICD-10-CM | POA: Diagnosis not present

## 2017-08-08 DIAGNOSIS — R278 Other lack of coordination: Secondary | ICD-10-CM | POA: Diagnosis not present

## 2017-08-08 DIAGNOSIS — M6281 Muscle weakness (generalized): Secondary | ICD-10-CM | POA: Diagnosis not present

## 2017-08-09 DIAGNOSIS — M6281 Muscle weakness (generalized): Secondary | ICD-10-CM | POA: Diagnosis not present

## 2017-08-09 DIAGNOSIS — R278 Other lack of coordination: Secondary | ICD-10-CM | POA: Diagnosis not present

## 2017-08-10 DIAGNOSIS — R278 Other lack of coordination: Secondary | ICD-10-CM | POA: Diagnosis not present

## 2017-08-10 DIAGNOSIS — M6281 Muscle weakness (generalized): Secondary | ICD-10-CM | POA: Diagnosis not present

## 2017-08-11 DIAGNOSIS — M6281 Muscle weakness (generalized): Secondary | ICD-10-CM | POA: Diagnosis not present

## 2017-08-11 DIAGNOSIS — R278 Other lack of coordination: Secondary | ICD-10-CM | POA: Diagnosis not present

## 2017-08-14 DIAGNOSIS — R278 Other lack of coordination: Secondary | ICD-10-CM | POA: Diagnosis not present

## 2017-08-14 DIAGNOSIS — M6281 Muscle weakness (generalized): Secondary | ICD-10-CM | POA: Diagnosis not present

## 2017-08-15 ENCOUNTER — Telehealth: Payer: Self-pay

## 2017-08-15 DIAGNOSIS — M6281 Muscle weakness (generalized): Secondary | ICD-10-CM | POA: Diagnosis not present

## 2017-08-15 DIAGNOSIS — R278 Other lack of coordination: Secondary | ICD-10-CM | POA: Diagnosis not present

## 2017-08-15 NOTE — Telephone Encounter (Signed)
Margaret Hicks with [facility] left v/m that pt has not responded to MOM and pt continues with constipation for couple of days; pt is very uncomfortable; Andrey Campanile questions if pt is impacted. Andrey Campanile wants to know if pt could get order to take something daily to prevent constipation also. Andrey Campanile going to give pt another dose of MOM and request cb ASAP.

## 2017-08-15 NOTE — Telephone Encounter (Signed)
Order given to [facility] and she will keep [place] updated

## 2017-08-15 NOTE — Telephone Encounter (Signed)
Tammy with [facility] in [place] left v/m that pt has had constipation for 2 days and MOM is not helping. Tammy request cb.

## 2017-08-15 NOTE — Telephone Encounter (Signed)
Go ahead and give MOM again  Start miralax 1 capful with water once every day (unless she develops diarrhea)  Keep me posted

## 2017-08-16 DIAGNOSIS — M6281 Muscle weakness (generalized): Secondary | ICD-10-CM | POA: Diagnosis not present

## 2017-08-16 DIAGNOSIS — R278 Other lack of coordination: Secondary | ICD-10-CM | POA: Diagnosis not present

## 2017-08-17 DIAGNOSIS — R278 Other lack of coordination: Secondary | ICD-10-CM | POA: Diagnosis not present

## 2017-08-17 DIAGNOSIS — M6281 Muscle weakness (generalized): Secondary | ICD-10-CM | POA: Diagnosis not present

## 2017-08-18 DIAGNOSIS — M6281 Muscle weakness (generalized): Secondary | ICD-10-CM | POA: Diagnosis not present

## 2017-08-18 DIAGNOSIS — R278 Other lack of coordination: Secondary | ICD-10-CM | POA: Diagnosis not present

## 2017-08-21 DIAGNOSIS — M6281 Muscle weakness (generalized): Secondary | ICD-10-CM | POA: Diagnosis not present

## 2017-08-21 DIAGNOSIS — R278 Other lack of coordination: Secondary | ICD-10-CM | POA: Diagnosis not present

## 2017-08-22 DIAGNOSIS — M6281 Muscle weakness (generalized): Secondary | ICD-10-CM | POA: Diagnosis not present

## 2017-08-22 DIAGNOSIS — R278 Other lack of coordination: Secondary | ICD-10-CM | POA: Diagnosis not present

## 2017-08-23 DIAGNOSIS — M6281 Muscle weakness (generalized): Secondary | ICD-10-CM | POA: Diagnosis not present

## 2017-08-23 DIAGNOSIS — R278 Other lack of coordination: Secondary | ICD-10-CM | POA: Diagnosis not present

## 2017-08-24 DIAGNOSIS — M6281 Muscle weakness (generalized): Secondary | ICD-10-CM | POA: Diagnosis not present

## 2017-08-24 DIAGNOSIS — R278 Other lack of coordination: Secondary | ICD-10-CM | POA: Diagnosis not present

## 2017-08-25 DIAGNOSIS — M6281 Muscle weakness (generalized): Secondary | ICD-10-CM | POA: Diagnosis not present

## 2017-08-25 DIAGNOSIS — R278 Other lack of coordination: Secondary | ICD-10-CM | POA: Diagnosis not present

## 2017-08-28 ENCOUNTER — Other Ambulatory Visit: Payer: Self-pay | Admitting: Family Medicine

## 2017-08-28 DIAGNOSIS — R278 Other lack of coordination: Secondary | ICD-10-CM | POA: Diagnosis not present

## 2017-08-28 DIAGNOSIS — M6281 Muscle weakness (generalized): Secondary | ICD-10-CM | POA: Diagnosis not present

## 2017-08-29 DIAGNOSIS — M6281 Muscle weakness (generalized): Secondary | ICD-10-CM | POA: Diagnosis not present

## 2017-08-29 DIAGNOSIS — R278 Other lack of coordination: Secondary | ICD-10-CM | POA: Diagnosis not present

## 2017-08-29 NOTE — Telephone Encounter (Signed)
Last TSH was oct of last year and no future appts. and last OV was 01/13/17, please advise

## 2017-08-29 NOTE — Telephone Encounter (Signed)
Please schedule winter f/u and refill until then 

## 2017-08-30 DIAGNOSIS — R278 Other lack of coordination: Secondary | ICD-10-CM | POA: Diagnosis not present

## 2017-08-30 DIAGNOSIS — M6281 Muscle weakness (generalized): Secondary | ICD-10-CM | POA: Diagnosis not present

## 2017-08-31 DIAGNOSIS — M6281 Muscle weakness (generalized): Secondary | ICD-10-CM | POA: Diagnosis not present

## 2017-08-31 DIAGNOSIS — R278 Other lack of coordination: Secondary | ICD-10-CM | POA: Diagnosis not present

## 2017-08-31 NOTE — Telephone Encounter (Signed)
appt scheduled and med refilled 

## 2017-09-01 DIAGNOSIS — M6281 Muscle weakness (generalized): Secondary | ICD-10-CM | POA: Diagnosis not present

## 2017-09-01 DIAGNOSIS — R278 Other lack of coordination: Secondary | ICD-10-CM | POA: Diagnosis not present

## 2017-09-03 DIAGNOSIS — R278 Other lack of coordination: Secondary | ICD-10-CM | POA: Diagnosis not present

## 2017-09-03 DIAGNOSIS — M6281 Muscle weakness (generalized): Secondary | ICD-10-CM | POA: Diagnosis not present

## 2017-09-04 DIAGNOSIS — M6281 Muscle weakness (generalized): Secondary | ICD-10-CM | POA: Diagnosis not present

## 2017-09-04 DIAGNOSIS — R278 Other lack of coordination: Secondary | ICD-10-CM | POA: Diagnosis not present

## 2017-09-05 DIAGNOSIS — R278 Other lack of coordination: Secondary | ICD-10-CM | POA: Diagnosis not present

## 2017-09-05 DIAGNOSIS — M6281 Muscle weakness (generalized): Secondary | ICD-10-CM | POA: Diagnosis not present

## 2017-09-06 DIAGNOSIS — M6281 Muscle weakness (generalized): Secondary | ICD-10-CM | POA: Diagnosis not present

## 2017-09-06 DIAGNOSIS — R278 Other lack of coordination: Secondary | ICD-10-CM | POA: Diagnosis not present

## 2017-09-10 DIAGNOSIS — M6281 Muscle weakness (generalized): Secondary | ICD-10-CM | POA: Diagnosis not present

## 2017-09-10 DIAGNOSIS — R278 Other lack of coordination: Secondary | ICD-10-CM | POA: Diagnosis not present

## 2017-09-11 DIAGNOSIS — M6281 Muscle weakness (generalized): Secondary | ICD-10-CM | POA: Diagnosis not present

## 2017-09-11 DIAGNOSIS — R278 Other lack of coordination: Secondary | ICD-10-CM | POA: Diagnosis not present

## 2017-09-12 DIAGNOSIS — M6281 Muscle weakness (generalized): Secondary | ICD-10-CM | POA: Diagnosis not present

## 2017-09-12 DIAGNOSIS — R278 Other lack of coordination: Secondary | ICD-10-CM | POA: Diagnosis not present

## 2017-09-14 DIAGNOSIS — R278 Other lack of coordination: Secondary | ICD-10-CM | POA: Diagnosis not present

## 2017-09-14 DIAGNOSIS — M6281 Muscle weakness (generalized): Secondary | ICD-10-CM | POA: Diagnosis not present

## 2017-09-15 DIAGNOSIS — M6281 Muscle weakness (generalized): Secondary | ICD-10-CM | POA: Diagnosis not present

## 2017-09-15 DIAGNOSIS — R278 Other lack of coordination: Secondary | ICD-10-CM | POA: Diagnosis not present

## 2017-09-18 DIAGNOSIS — R278 Other lack of coordination: Secondary | ICD-10-CM | POA: Diagnosis not present

## 2017-09-18 DIAGNOSIS — M6281 Muscle weakness (generalized): Secondary | ICD-10-CM | POA: Diagnosis not present

## 2017-09-19 DIAGNOSIS — M6281 Muscle weakness (generalized): Secondary | ICD-10-CM | POA: Diagnosis not present

## 2017-09-19 DIAGNOSIS — R278 Other lack of coordination: Secondary | ICD-10-CM | POA: Diagnosis not present

## 2017-09-20 DIAGNOSIS — M6281 Muscle weakness (generalized): Secondary | ICD-10-CM | POA: Diagnosis not present

## 2017-09-20 DIAGNOSIS — R278 Other lack of coordination: Secondary | ICD-10-CM | POA: Diagnosis not present

## 2017-09-20 DIAGNOSIS — Z23 Encounter for immunization: Secondary | ICD-10-CM | POA: Diagnosis not present

## 2017-09-21 DIAGNOSIS — R278 Other lack of coordination: Secondary | ICD-10-CM | POA: Diagnosis not present

## 2017-09-21 DIAGNOSIS — M6281 Muscle weakness (generalized): Secondary | ICD-10-CM | POA: Diagnosis not present

## 2017-09-21 DIAGNOSIS — R2689 Other abnormalities of gait and mobility: Secondary | ICD-10-CM | POA: Diagnosis not present

## 2017-09-22 DIAGNOSIS — R2689 Other abnormalities of gait and mobility: Secondary | ICD-10-CM | POA: Diagnosis not present

## 2017-09-22 DIAGNOSIS — M6281 Muscle weakness (generalized): Secondary | ICD-10-CM | POA: Diagnosis not present

## 2017-09-22 DIAGNOSIS — R278 Other lack of coordination: Secondary | ICD-10-CM | POA: Diagnosis not present

## 2017-09-25 DIAGNOSIS — R278 Other lack of coordination: Secondary | ICD-10-CM | POA: Diagnosis not present

## 2017-09-25 DIAGNOSIS — M6281 Muscle weakness (generalized): Secondary | ICD-10-CM | POA: Diagnosis not present

## 2017-09-25 DIAGNOSIS — R2689 Other abnormalities of gait and mobility: Secondary | ICD-10-CM | POA: Diagnosis not present

## 2017-09-26 DIAGNOSIS — M6281 Muscle weakness (generalized): Secondary | ICD-10-CM | POA: Diagnosis not present

## 2017-09-26 DIAGNOSIS — R278 Other lack of coordination: Secondary | ICD-10-CM | POA: Diagnosis not present

## 2017-09-26 DIAGNOSIS — R2689 Other abnormalities of gait and mobility: Secondary | ICD-10-CM | POA: Diagnosis not present

## 2017-09-27 DIAGNOSIS — R278 Other lack of coordination: Secondary | ICD-10-CM | POA: Diagnosis not present

## 2017-09-27 DIAGNOSIS — M6281 Muscle weakness (generalized): Secondary | ICD-10-CM | POA: Diagnosis not present

## 2017-09-27 DIAGNOSIS — R2689 Other abnormalities of gait and mobility: Secondary | ICD-10-CM | POA: Diagnosis not present

## 2017-09-28 DIAGNOSIS — R278 Other lack of coordination: Secondary | ICD-10-CM | POA: Diagnosis not present

## 2017-09-28 DIAGNOSIS — R2689 Other abnormalities of gait and mobility: Secondary | ICD-10-CM | POA: Diagnosis not present

## 2017-09-28 DIAGNOSIS — M6281 Muscle weakness (generalized): Secondary | ICD-10-CM | POA: Diagnosis not present

## 2017-09-29 DIAGNOSIS — R2689 Other abnormalities of gait and mobility: Secondary | ICD-10-CM | POA: Diagnosis not present

## 2017-09-29 DIAGNOSIS — M6281 Muscle weakness (generalized): Secondary | ICD-10-CM | POA: Diagnosis not present

## 2017-09-29 DIAGNOSIS — R278 Other lack of coordination: Secondary | ICD-10-CM | POA: Diagnosis not present

## 2017-09-30 DIAGNOSIS — R2689 Other abnormalities of gait and mobility: Secondary | ICD-10-CM | POA: Diagnosis not present

## 2017-09-30 DIAGNOSIS — M6281 Muscle weakness (generalized): Secondary | ICD-10-CM | POA: Diagnosis not present

## 2017-09-30 DIAGNOSIS — R278 Other lack of coordination: Secondary | ICD-10-CM | POA: Diagnosis not present

## 2017-10-02 DIAGNOSIS — R2689 Other abnormalities of gait and mobility: Secondary | ICD-10-CM | POA: Diagnosis not present

## 2017-10-02 DIAGNOSIS — R278 Other lack of coordination: Secondary | ICD-10-CM | POA: Diagnosis not present

## 2017-10-02 DIAGNOSIS — M6281 Muscle weakness (generalized): Secondary | ICD-10-CM | POA: Diagnosis not present

## 2017-10-03 DIAGNOSIS — R278 Other lack of coordination: Secondary | ICD-10-CM | POA: Diagnosis not present

## 2017-10-03 DIAGNOSIS — M6281 Muscle weakness (generalized): Secondary | ICD-10-CM | POA: Diagnosis not present

## 2017-10-03 DIAGNOSIS — R2689 Other abnormalities of gait and mobility: Secondary | ICD-10-CM | POA: Diagnosis not present

## 2017-10-04 DIAGNOSIS — M6281 Muscle weakness (generalized): Secondary | ICD-10-CM | POA: Diagnosis not present

## 2017-10-04 DIAGNOSIS — R2689 Other abnormalities of gait and mobility: Secondary | ICD-10-CM | POA: Diagnosis not present

## 2017-10-04 DIAGNOSIS — R278 Other lack of coordination: Secondary | ICD-10-CM | POA: Diagnosis not present

## 2017-10-05 DIAGNOSIS — R278 Other lack of coordination: Secondary | ICD-10-CM | POA: Diagnosis not present

## 2017-10-05 DIAGNOSIS — R2689 Other abnormalities of gait and mobility: Secondary | ICD-10-CM | POA: Diagnosis not present

## 2017-10-05 DIAGNOSIS — M6281 Muscle weakness (generalized): Secondary | ICD-10-CM | POA: Diagnosis not present

## 2017-10-06 DIAGNOSIS — M6281 Muscle weakness (generalized): Secondary | ICD-10-CM | POA: Diagnosis not present

## 2017-10-06 DIAGNOSIS — R2689 Other abnormalities of gait and mobility: Secondary | ICD-10-CM | POA: Diagnosis not present

## 2017-10-06 DIAGNOSIS — R278 Other lack of coordination: Secondary | ICD-10-CM | POA: Diagnosis not present

## 2017-10-07 DIAGNOSIS — R2689 Other abnormalities of gait and mobility: Secondary | ICD-10-CM | POA: Diagnosis not present

## 2017-10-07 DIAGNOSIS — M6281 Muscle weakness (generalized): Secondary | ICD-10-CM | POA: Diagnosis not present

## 2017-10-07 DIAGNOSIS — R278 Other lack of coordination: Secondary | ICD-10-CM | POA: Diagnosis not present

## 2017-10-08 DIAGNOSIS — R278 Other lack of coordination: Secondary | ICD-10-CM | POA: Diagnosis not present

## 2017-10-08 DIAGNOSIS — R2689 Other abnormalities of gait and mobility: Secondary | ICD-10-CM | POA: Diagnosis not present

## 2017-10-08 DIAGNOSIS — M6281 Muscle weakness (generalized): Secondary | ICD-10-CM | POA: Diagnosis not present

## 2017-10-09 DIAGNOSIS — M6281 Muscle weakness (generalized): Secondary | ICD-10-CM | POA: Diagnosis not present

## 2017-10-09 DIAGNOSIS — R2689 Other abnormalities of gait and mobility: Secondary | ICD-10-CM | POA: Diagnosis not present

## 2017-10-09 DIAGNOSIS — R278 Other lack of coordination: Secondary | ICD-10-CM | POA: Diagnosis not present

## 2017-10-18 DIAGNOSIS — I739 Peripheral vascular disease, unspecified: Secondary | ICD-10-CM | POA: Diagnosis not present

## 2017-10-18 DIAGNOSIS — B351 Tinea unguium: Secondary | ICD-10-CM | POA: Diagnosis not present

## 2017-10-18 DIAGNOSIS — L603 Nail dystrophy: Secondary | ICD-10-CM | POA: Diagnosis not present

## 2017-10-18 DIAGNOSIS — M79675 Pain in left toe(s): Secondary | ICD-10-CM | POA: Diagnosis not present

## 2017-10-18 DIAGNOSIS — M79674 Pain in right toe(s): Secondary | ICD-10-CM | POA: Diagnosis not present

## 2017-11-03 ENCOUNTER — Ambulatory Visit (INDEPENDENT_AMBULATORY_CARE_PROVIDER_SITE_OTHER): Payer: Medicare Other | Admitting: Family Medicine

## 2017-11-03 ENCOUNTER — Encounter: Payer: Self-pay | Admitting: Family Medicine

## 2017-11-03 VITALS — BP 128/82 | HR 55 | Temp 97.9°F | Ht 60.5 in | Wt 220.8 lb

## 2017-11-03 DIAGNOSIS — I10 Essential (primary) hypertension: Secondary | ICD-10-CM

## 2017-11-03 DIAGNOSIS — E785 Hyperlipidemia, unspecified: Secondary | ICD-10-CM | POA: Diagnosis not present

## 2017-11-03 DIAGNOSIS — N289 Disorder of kidney and ureter, unspecified: Secondary | ICD-10-CM

## 2017-11-03 DIAGNOSIS — E039 Hypothyroidism, unspecified: Secondary | ICD-10-CM

## 2017-11-03 DIAGNOSIS — I728 Aneurysm of other specified arteries: Secondary | ICD-10-CM | POA: Diagnosis not present

## 2017-11-03 DIAGNOSIS — R600 Localized edema: Secondary | ICD-10-CM | POA: Diagnosis not present

## 2017-11-03 DIAGNOSIS — F329 Major depressive disorder, single episode, unspecified: Secondary | ICD-10-CM

## 2017-11-03 DIAGNOSIS — B372 Candidiasis of skin and nail: Secondary | ICD-10-CM | POA: Diagnosis not present

## 2017-11-03 DIAGNOSIS — F0390 Unspecified dementia without behavioral disturbance: Secondary | ICD-10-CM | POA: Diagnosis not present

## 2017-11-03 DIAGNOSIS — F32A Depression, unspecified: Secondary | ICD-10-CM

## 2017-11-03 LAB — LIPID PANEL
Cholesterol: 182 mg/dL (ref 0–200)
HDL: 50.8 mg/dL (ref >39.00)
LDL CALC: 103 mg/dL — AB (ref 0–99)
NONHDL: 130.76
Total CHOL/HDL Ratio: 4
Triglycerides: 140 mg/dL (ref 0.0–149.0)
VLDL: 28 mg/dL (ref 0.0–40.0)

## 2017-11-03 LAB — CBC WITH DIFFERENTIAL/PLATELET
BASOS PCT: 0.4 % (ref 0.0–3.0)
Basophils Absolute: 0 10*3/uL (ref 0.0–0.1)
EOS PCT: 1.3 % (ref 0.0–5.0)
Eosinophils Absolute: 0.1 10*3/uL (ref 0.0–0.7)
HCT: 39.7 % (ref 36.0–46.0)
Hemoglobin: 13.3 g/dL (ref 12.0–15.0)
LYMPHS ABS: 1.2 10*3/uL (ref 0.7–4.0)
Lymphocytes Relative: 21.3 % (ref 12.0–46.0)
MCHC: 33.6 g/dL (ref 30.0–36.0)
MCV: 85.8 fl (ref 78.0–100.0)
MONO ABS: 0.3 10*3/uL (ref 0.1–1.0)
Monocytes Relative: 5.9 % (ref 3.0–12.0)
NEUTROS ABS: 3.9 10*3/uL (ref 1.4–7.7)
NEUTROS PCT: 71.1 % (ref 43.0–77.0)
PLATELETS: 192 10*3/uL (ref 150.0–400.0)
RBC: 4.63 Mil/uL (ref 3.87–5.11)
RDW: 15 % (ref 11.5–15.5)
WBC: 5.5 10*3/uL (ref 4.0–10.5)

## 2017-11-03 LAB — COMPREHENSIVE METABOLIC PANEL
ALT: 12 U/L (ref 0–35)
AST: 17 U/L (ref 0–37)
Albumin: 3.7 g/dL (ref 3.5–5.2)
Alkaline Phosphatase: 107 U/L (ref 39–117)
BUN: 20 mg/dL (ref 6–23)
CHLORIDE: 101 meq/L (ref 96–112)
CO2: 31 mEq/L (ref 19–32)
Calcium: 9.6 mg/dL (ref 8.4–10.5)
Creatinine, Ser: 1.21 mg/dL — ABNORMAL HIGH (ref 0.40–1.20)
GFR: 45.08 mL/min — AB (ref >60.00)
GLUCOSE: 117 mg/dL — AB (ref 70–99)
POTASSIUM: 4.3 meq/L (ref 3.5–5.1)
SODIUM: 136 meq/L (ref 135–145)
Total Bilirubin: 0.8 mg/dL (ref 0.2–1.2)
Total Protein: 6.4 g/dL (ref 6.0–8.3)

## 2017-11-03 LAB — TSH: TSH: 3.74 u[IU]/mL (ref 0.35–4.50)

## 2017-11-03 MED ORDER — SERTRALINE HCL 50 MG PO TABS: 50.0000 mg | ORAL_TABLET | Freq: Every day | ORAL | 11 refills | Status: DC

## 2017-11-03 MED ORDER — SPIRONOLACTONE 25 MG PO TABS: 12.5000 mg | ORAL_TABLET | Freq: Every day | ORAL | 11 refills | Status: DC

## 2017-11-03 MED ORDER — LOSARTAN POTASSIUM 100 MG PO TABS: 100.0000 mg | ORAL_TABLET | Freq: Every day | ORAL | 11 refills | Status: DC

## 2017-11-03 MED ORDER — METOPROLOL TARTRATE 100 MG PO TABS: 50.0000 mg | ORAL_TABLET | Freq: Two times a day (BID) | ORAL | 11 refills | Status: DC

## 2017-11-03 MED ORDER — LANSOPRAZOLE 15 MG PO CPDR: 15.0000 mg | DELAYED_RELEASE_CAPSULE | Freq: Every day | ORAL | 11 refills | Status: DC

## 2017-11-03 MED ORDER — LEVOTHYROXINE SODIUM 100 MCG PO TABS: ORAL_TABLET | ORAL | 11 refills | Status: DC

## 2017-11-03 NOTE — Assessment & Plan Note (Signed)
Some wt loss noted Has dementia in nursing care  Enc to get her up to walk whenever possible  Also stop sodas/ sugary drinks

## 2017-11-03 NOTE — Assessment & Plan Note (Signed)
bp in fair control at this time  BP Readings from Last 1 Encounters:  11/03/17 128/82   No changes needed Disc lifstyle change with low sodium diet and exercise  No chagne in tx  Enc further wt loss

## 2017-11-03 NOTE — Patient Instructions (Addendum)
I refilled regular px for pill pack  She will need multi vit and vit D in am as well in her pill pack  Rest of medicines are as needed   Labs today   Avoid sodas Encourage water intake   Glad you had your flu shot

## 2017-11-03 NOTE — Assessment & Plan Note (Addendum)
Lab today  No clinical changes except worsening dementia and some wt loss likely related to that

## 2017-11-03 NOTE — Assessment & Plan Note (Signed)
Stable  Dementia -generally content  Wants to continue zoloft  No recent agitation

## 2017-11-03 NOTE — Assessment & Plan Note (Signed)
No longer sees Dr Cherylann RatelLateef since he moved  Would rather not see renal unless labs change  Counseled on good fluid intake  Lab today

## 2017-11-03 NOTE — Progress Notes (Signed)
Subjective:    Patient ID: Margaret Hicks, female    DOB: 1931/12/13, 83 y.o.   MRN: [id number]  HPI Here for f/u of chronic medical problems   Wt Readings from Last 3 Encounters:  11/03/17 220 lb 12 oz (100.1 kg)  01/13/17 230 lb 12 oz (104.7 kg)  09/26/16 227 lb 4 oz (103.1 kg)  down 10 lb  Appetite is good   42.40 kg/m  Unsure if she [facility]   Due for annual labs bp is stable today  No cp or palpitations or headaches or edema  No side effects to medicines  BP Readings from Last 3 Encounters:  11/03/17 128/82  01/13/17 124/70  09/26/16 124/82   aldactone  Metoprolol Cozaar  Overall no problems    Hypothyroidism Due for labs No clinical changes  Lab Results  Component Value Date   TSH 3.88 09/16/2016     Mild renal insuff Lab Results  Component Value Date   CREATININE 1.19 09/16/2016   BUN 26 (H) 09/16/2016   NA 138 09/16/2016   K 4.3 09/16/2016   CL 103 09/16/2016   CO2 29 09/16/2016   Sees Dr Cherylann RatelLateef for this (he moved and she is not seeing anyone)-would rather not if labs are stable   Has depression with dementia On zoloft  Mood is stable Memory is worse    Pedal edema -up and down depending on her willingness to elevate feet   Mammogram 2/14- declines further   [facility] /di[facility]/constipation/pain     Patient Active Problem List   Diagnosis Date Noted  . Candidal intertrigo 01/13/2017  . Foot deformity, acquired 11/25/2016  . Screening mammogram, encounter for 09/26/2016  . Morbid obesity (HCC) 09/26/2016  . Mobility impaired 09/17/2014  . Encounter for Medicare annual wellness exam 09/01/2014  . Internal hemorrhoids 08/27/2014  . Pedal edema 08/12/2013  . Dysphagia, unspecified(787.20) 05/29/2013  . Aneurysm, splenic artery (HCC) 05/29/2013  . Senile dementia, uncomplicated 01/09/2013  . Back pain, thoracic 11/28/2012  . Depression 11/28/2012  . Other screening  mammogram 11/28/2012  . EXTERNAL HEMORRHOIDS 01/26/2011  . GERD 09/01/2010  . Hypothyroidism 05/27/2010  . Hyperlipidemia LDL goal <100 05/27/2010  . Essential hypertension 05/27/2010  . Mild renal insufficiency 05/27/2010  . Unspecified urinary incontinence 05/27/2010  . Personal history of urinary disorder 05/27/2010   Past Medical History:  Diagnosis Date  . Acute gastritis   . Arthritis   . Back pain   . Dementia   . Esophageal reflux   . GERD (gastroesophageal reflux disease)   . Hemorrhoids   . Hyperlipemia   . Hypertension   . Hypothyroidism   . Obesity   . OP (osteoporosis)   . Renal insufficiency   . Schatzki's ring   . TIA (transient ischemic attack)   . Upper GI bleed   . Urinary incontinence    Past Surgical History:  Procedure Laterality Date  . ABDOMINAL HYSTERECTOMY    . CARDIAC CATHETERIZATION    . CHOLECYSTECTOMY    . TONSILLECTOMY     Social History   Tobacco Use  . Smoking status: Never Smoker  . Smokeless tobacco: Never Used  Substance Use Topics  . Alcohol use: No    Alcohol/week: 0.0 oz  . Drug use: No   Family History  Problem Relation Age of Onset  . Dementia Mother   . Stroke Mother   . Dementia Brother   .  Dementia Brother   . Arthritis Sister   . Cancer Unknown        nephew  . Heart disease Father   . Colon cancer Neg Hx    Allergies  Allergen Reactions  . Simvastatin     REACTION: muscle pain/ memory issues   Current Outpatient Medications on File Prior to Visit  Medication Sig Dispense Refill  . acetaminophen (TYLENOL) 500 MG tablet Take 500 mg by mouth every 8 (eight) hours as needed for moderate pain or headache.    . Alum & Mag Hydroxide-Simeth (GERI-LANTA PO) Take 30 mLs by mouth 4 (four) times daily as needed.    . bismuth subsalicylate (PEPTO BISMOL) 262 MG/15ML suspension Take 10 mLs by mouth every 6 (six) hours as needed.    . cholecalciferol (VITAMIN D) 1000 UNITS tablet Take 1,000 Units by mouth every  evening.     Marland Kitchen. Dextromethorphan HBr (ROBAFEN COUGH PO) Take by mouth.    Marland Kitchen. guaiFENesin (TUSSIN) 100 MG/5ML liquid Take 200 mg by mouth daily as needed for cough.    . loperamide (ANTI-DIARRHEAL) 2 MG capsule Take 2 mg by mouth as needed for diarrhea or loose stools.    . magnesium hydroxide (MILK OF MAGNESIA) 400 MG/5ML suspension Take 30 mLs by mouth daily as needed for mild constipation.    . Multiple Vitamin (MULTIVITAMIN) capsule Take 1 capsule by mouth daily.      Marland Kitchen. Neomycin-Bacitracin-Polymyxin (TRIPLE ANTIBIOTIC EX) Apply 1 application topically daily as needed.    . nystatin (MYCOSTATIN/NYSTOP) powder Apply topically 4 (four) times daily. To affected areas under breasts 30 g 2  . nystatin (MYCOSTATIN/NYSTOP) powder Apply topically 4 (four) times daily. To affected areas 30 g 3   No current facility-administered medications on file prior to visit.     Review of Systems  Constitutional: Negative for activity change, appetite change, fatigue, fever and unexpected weight change.  HENT: Negative for congestion, ear pain, rhinorrhea, sinus pressure and sore throat.   Eyes: Negative for pain, redness and visual disturbance.  Respiratory: Negative for cough, shortness of breath and wheezing.   Cardiovascular: Positive for leg swelling. Negative for chest pain and palpitations.  Gastrointestinal: Negative for abdominal pain, blood in stool, constipation and diarrhea.  Endocrine: Negative for polydipsia and polyuria.  Genitourinary: Negative for dysuria, frequency and urgency.  Musculoskeletal: Negative for arthralgias, back pain and myalgias.  Skin: Negative for pallor and rash.  Allergic/Immunologic: Negative for environmental allergies.  Neurological: Negative for dizziness, syncope and headaches.  Hematological: Negative for adenopathy. Does not bruise/bleed easily.  Psychiatric/Behavioral: Positive for confusion. Negative for agitation, behavioral problems, decreased concentration,  dysphoric mood and hallucinations. The patient is not nervous/anxious.        Pos for dementia  No recent agitation        Objective:   Physical Exam  Constitutional: She appears well-developed and well-nourished. No distress.  obese and well appearing  Elderly female with dementia in wheelchair  HENT:  Head: Normocephalic and atraumatic.  Right Ear: External ear normal.  Left Ear: External ear normal.  Nose: Nose normal.  Mouth/Throat: Oropharynx is clear and moist.  Eyes: Conjunctivae and EOM are normal. Pupils are equal, round, and reactive to light. Right eye exhibits no discharge. Left eye exhibits no discharge. No scleral icterus.  Neck: Normal range of motion. Neck supple. No JVD present. Carotid bruit is not present. No thyromegaly present.  Cardiovascular: Normal rate, regular rhythm, normal heart sounds and intact distal pulses. Exam reveals  no gallop.  LE varicosities  Pulmonary/Chest: Effort normal and breath sounds normal. No respiratory distress. She has no wheezes. She has no rales. She exhibits no tenderness.  Abdominal: Soft. Bowel sounds are normal. She exhibits no distension and no mass. There is no tenderness.  Exam done sitting   Musculoskeletal: She exhibits edema. She exhibits no tenderness.  Baseline 1 plus pedal edema without skin changes   Lymphadenopathy:    She has no cervical adenopathy.  Neurological: She is alert. She has normal reflexes. No cranial nerve deficit. She exhibits normal muscle tone. Coordination normal.  Skin: Skin is warm and dry. No rash noted. No erythema. No pallor.  Solar lentigines diffusely   Psychiatric: She exhibits abnormal recent memory and abnormal remote memory.  Content-talks while looking at a magazine Child like affect           Assessment & Plan:   Problem List Items Addressed This Visit      Cardiovascular and Mediastinum   Aneurysm, splenic artery (HCC)    No complaints/symptoms      Relevant Medications     spironolactone (ALDACTONE) 25 MG tablet   metoprolol tartrate (LOPRESSOR) 100 MG tablet   losartan (COZAAR) 100 MG tablet   Essential hypertension - Primary    bp in fair control at this time  BP Readings from Last 1 Encounters:  11/03/17 128/82   No changes needed Disc lifstyle change with low sodium diet and exercise  No chagne in tx  Enc further wt loss       Relevant Medications   spironolactone (ALDACTONE) 25 MG tablet   metoprolol tartrate (LOPRESSOR) 100 MG tablet   losartan (COZAAR) 100 MG tablet   Other Relevant Orders   CBC with Differential/Platelet   Comprehensive metabolic panel   Lipid panel   TSH     Endocrine   Hypothyroidism    Lab today  No clinical changes except worsening dementia and some wt loss likely related to that       Relevant Medications   metoprolol tartrate (LOPRESSOR) 100 MG tablet   levothyroxine (SYNTHROID, LEVOTHROID) 100 MCG tablet   Other Relevant Orders   TSH     Nervous and Auditory   Senile dementia, uncomplicated    Continues to gradually worsen [facility]  No medications for this but does take sertraline for mood Continues to be content  Stubborn about elevating legs or walking but likes to socialize  No safety concerns       Relevant Medications   sertraline (ZOLOFT) 50 MG tablet     Musculoskeletal and Integument   Candidal intertrigo    Intermittent Continues mycostatin powder prn        Genitourinary   Mild renal insufficiency    No longer sees Dr Cherylann Ratel since he moved  Would rather not see renal unless labs change  Counseled on good fluid intake  Lab today      Relevant Orders   Comprehensive metabolic panel     Other   Depression    Stable  Dementia -generally content  Wants to continue zoloft  No recent agitation       Relevant Medications   sertraline (ZOLOFT) 50 MG tablet   Hyperlipidemia LDL goal <100    Disc goals for lipids and reasons to control them Rev labs with pt  (last draw) Rev low sat fat diet in detail Lab today      Relevant Medications   spironolactone (ALDACTONE) 25 MG  tablet   metoprolol tartrate (LOPRESSOR) 100 MG tablet   losartan (COZAAR) 100 MG tablet   Other Relevant Orders   Lipid panel   Morbid obesity (HCC)    Some wt loss noted Has dementia in nursing care  Enc to get her up to walk whenever possible  Also stop sodas/ sugary drinks        Pedal edema    This is fairly stable  With dementia-refuses leg elevation and supp hose Staff do the best she can Continue current aldactone  No signs of skin breakdown today

## 2017-11-03 NOTE — Assessment & Plan Note (Signed)
This is fairly stable  With dementia-refuses leg elevation and supp hose Staff do the best she can Continue current aldactone  No signs of skin breakdown today

## 2017-11-03 NOTE — Assessment & Plan Note (Signed)
Contin[facility] to [facility] worsen [facility]  No medications for this but does take sertraline for mood Continues to be content  Stubborn about elevating legs or walking but likes to socialize  No safety concerns

## 2017-11-03 NOTE — Assessment & Plan Note (Signed)
Intermittent Continues mycostatin powder prn

## 2017-11-03 NOTE — Assessment & Plan Note (Signed)
No complaints/symptoms

## 2017-11-03 NOTE — Assessment & Plan Note (Signed)
Disc goals for lipids and reasons to control them Rev labs with pt (last draw)  Rev low sat fat diet in detail Lab today  

## 2017-11-23 ENCOUNTER — Other Ambulatory Visit: Payer: Self-pay | Admitting: *Deleted

## 2017-11-23 MED ORDER — METOPROLOL TARTRATE 100 MG PO TABS: 50.0000 mg | ORAL_TABLET | Freq: Two times a day (BID) | ORAL | 11 refills | Status: DC

## 2017-11-23 MED ORDER — LANSOPRAZOLE 15 MG PO CPDR: 15.0000 mg | DELAYED_RELEASE_CAPSULE | Freq: Every day | ORAL | 11 refills | Status: DC

## 2017-11-23 MED ORDER — LOSARTAN POTASSIUM 100 MG PO TABS: 100.0000 mg | ORAL_TABLET | Freq: Every day | ORAL | 11 refills | Status: DC

## 2017-11-23 MED ORDER — LEVOTHYROXINE SODIUM 100 MCG PO TABS: ORAL_TABLET | ORAL | 11 refills | Status: DC

## 2017-11-23 MED ORDER — SERTRALINE HCL 50 MG PO TABS: 50.0000 mg | ORAL_TABLET | Freq: Every day | ORAL | 11 refills | Status: DC

## 2017-11-23 NOTE — Telephone Encounter (Signed)
Received fax saying pt is using [facility] now, Rx sent

## 2017-11-24 ENCOUNTER — Telehealth: Payer: Self-pay | Admitting: Family Medicine

## 2017-11-24 MED ORDER — SPIRONOLACTONE 25 MG PO TABS: 12.5000 mg | ORAL_TABLET | Freq: Every day | ORAL | 11 refills | Status: DC

## 2017-11-24 MED ORDER — LANSOPRAZOLE 15 MG PO CPDR: 15.0000 mg | DELAYED_RELEASE_CAPSULE | Freq: Every day | ORAL | 11 refills | Status: DC

## 2017-11-24 NOTE — Telephone Encounter (Signed)
Copied from CRM [phone]. Topic: Quick Communication - Rx Refill/Question >> Nov 24, 2017  3:28 PM Landry MellowFoltz, Melissa J wrote: Has the patient contacted their pharmacy? Yes.  Pharm called    (Agent: If no, request that the patient contact the pharmacy for the refill.)   Preferred Pharmacy (with phone number or street name): [facility] - they did not get refills for lansoprazole (PREVACID) 15 MG capsule and for spironolactone (ALDACTONE) 25 MG tablet. Cb # for pharm is [phone]    Agent: Please be advised that RX refills may take up to 3 business days. We ask that you follow-up with your pharmacy.

## 2017-11-24 NOTE — Telephone Encounter (Signed)
Phone call to [facility].  Gave verbal order for Lansoprazole 15 mg, take one capsule by mouth daily at 12 PM.; #30, RF x 11.  Also gave v.o. for Spironolactone 25 mg; take 0.5 mg (12.5 mg) daily in AM; # 15, RF x 11.

## 2017-11-27 ENCOUNTER — Telehealth: Payer: Self-pay | Admitting: Family Medicine

## 2017-11-27 NOTE — Telephone Encounter (Signed)
Copied from CRM [id number]. Topic: General - Other >> Nov 27, 2017  3:06 PM Raquel SarnaHayes, Teresa G wrote: Kenney Housemananya w/ [facility] - [phone] - can leave a voice mail - secure voice mail Verbal Orders for pt: Pt needing Home Health Nursing - monitoring her due to stomach virus.  Kenney Housemananya will fax an order as well for Dr. Royden Purlower's signature.

## 2017-11-27 NOTE — Telephone Encounter (Signed)
Please ok that verbal order  

## 2017-11-27 NOTE — Telephone Encounter (Signed)
Verbal order given  

## 2017-12-02 DIAGNOSIS — I10 Essential (primary) hypertension: Secondary | ICD-10-CM | POA: Diagnosis not present

## 2017-12-02 DIAGNOSIS — R110 Nausea: Secondary | ICD-10-CM | POA: Diagnosis not present

## 2017-12-02 DIAGNOSIS — M545 Low back pain: Secondary | ICD-10-CM | POA: Diagnosis not present

## 2017-12-02 DIAGNOSIS — R197 Diarrhea, unspecified: Secondary | ICD-10-CM | POA: Diagnosis not present

## 2017-12-02 DIAGNOSIS — F0390 Unspecified dementia without behavioral disturbance: Secondary | ICD-10-CM | POA: Diagnosis not present

## 2017-12-06 DIAGNOSIS — R110 Nausea: Secondary | ICD-10-CM | POA: Diagnosis not present

## 2017-12-06 DIAGNOSIS — F0390 Unspecified dementia without behavioral disturbance: Secondary | ICD-10-CM | POA: Diagnosis not present

## 2017-12-06 DIAGNOSIS — I10 Essential (primary) hypertension: Secondary | ICD-10-CM | POA: Diagnosis not present

## 2017-12-06 DIAGNOSIS — M545 Low back pain: Secondary | ICD-10-CM | POA: Diagnosis not present

## 2017-12-06 DIAGNOSIS — R197 Diarrhea, unspecified: Secondary | ICD-10-CM | POA: Diagnosis not present

## 2017-12-08 DIAGNOSIS — R110 Nausea: Secondary | ICD-10-CM | POA: Diagnosis not present

## 2017-12-08 DIAGNOSIS — I10 Essential (primary) hypertension: Secondary | ICD-10-CM | POA: Diagnosis not present

## 2017-12-08 DIAGNOSIS — R197 Diarrhea, unspecified: Secondary | ICD-10-CM | POA: Diagnosis not present

## 2017-12-08 DIAGNOSIS — M545 Low back pain: Secondary | ICD-10-CM | POA: Diagnosis not present

## 2017-12-08 DIAGNOSIS — F0390 Unspecified dementia without behavioral disturbance: Secondary | ICD-10-CM | POA: Diagnosis not present

## 2017-12-15 DIAGNOSIS — R197 Diarrhea, unspecified: Secondary | ICD-10-CM | POA: Diagnosis not present

## 2017-12-15 DIAGNOSIS — I10 Essential (primary) hypertension: Secondary | ICD-10-CM | POA: Diagnosis not present

## 2017-12-15 DIAGNOSIS — R110 Nausea: Secondary | ICD-10-CM | POA: Diagnosis not present

## 2017-12-15 DIAGNOSIS — F0390 Unspecified dementia without behavioral disturbance: Secondary | ICD-10-CM | POA: Diagnosis not present

## 2017-12-15 DIAGNOSIS — M545 Low back pain: Secondary | ICD-10-CM | POA: Diagnosis not present

## 2017-12-18 DIAGNOSIS — R110 Nausea: Secondary | ICD-10-CM | POA: Diagnosis not present

## 2017-12-18 DIAGNOSIS — F0390 Unspecified dementia without behavioral disturbance: Secondary | ICD-10-CM | POA: Diagnosis not present

## 2017-12-18 DIAGNOSIS — R197 Diarrhea, unspecified: Secondary | ICD-10-CM | POA: Diagnosis not present

## 2017-12-18 DIAGNOSIS — I10 Essential (primary) hypertension: Secondary | ICD-10-CM | POA: Diagnosis not present

## 2017-12-18 DIAGNOSIS — M545 Low back pain: Secondary | ICD-10-CM | POA: Diagnosis not present

## 2017-12-20 DIAGNOSIS — I10 Essential (primary) hypertension: Secondary | ICD-10-CM | POA: Diagnosis not present

## 2017-12-20 DIAGNOSIS — R110 Nausea: Secondary | ICD-10-CM | POA: Diagnosis not present

## 2017-12-20 DIAGNOSIS — R197 Diarrhea, unspecified: Secondary | ICD-10-CM | POA: Diagnosis not present

## 2017-12-20 DIAGNOSIS — F0390 Unspecified dementia without behavioral disturbance: Secondary | ICD-10-CM | POA: Diagnosis not present

## 2017-12-20 DIAGNOSIS — M545 Low back pain: Secondary | ICD-10-CM | POA: Diagnosis not present

## 2018-01-03 DIAGNOSIS — R197 Diarrhea, unspecified: Secondary | ICD-10-CM | POA: Diagnosis not present

## 2018-01-03 DIAGNOSIS — I10 Essential (primary) hypertension: Secondary | ICD-10-CM | POA: Diagnosis not present

## 2018-01-03 DIAGNOSIS — F0390 Unspecified dementia without behavioral disturbance: Secondary | ICD-10-CM | POA: Diagnosis not present

## 2018-01-03 DIAGNOSIS — R110 Nausea: Secondary | ICD-10-CM | POA: Diagnosis not present

## 2018-01-03 DIAGNOSIS — M545 Low back pain: Secondary | ICD-10-CM | POA: Diagnosis not present

## 2018-01-05 DIAGNOSIS — B351 Tinea unguium: Secondary | ICD-10-CM | POA: Diagnosis not present

## 2018-01-05 DIAGNOSIS — I739 Peripheral vascular disease, unspecified: Secondary | ICD-10-CM | POA: Diagnosis not present

## 2018-01-05 DIAGNOSIS — R262 Difficulty in walking, not elsewhere classified: Secondary | ICD-10-CM | POA: Diagnosis not present

## 2018-04-10 ENCOUNTER — Encounter: Payer: Self-pay | Admitting: Primary Care

## 2018-04-10 ENCOUNTER — Ambulatory Visit (INDEPENDENT_AMBULATORY_CARE_PROVIDER_SITE_OTHER): Payer: Medicare Other | Admitting: Primary Care

## 2018-04-10 VITALS — BP 122/74 | HR 54 | Temp 98.7°F | Ht 60.5 in | Wt 220.8 lb

## 2018-04-10 DIAGNOSIS — J069 Acute upper respiratory infection, unspecified: Secondary | ICD-10-CM | POA: Diagnosis not present

## 2018-04-10 MED ORDER — AZITHROMYCIN 250 MG PO TABS: ORAL_TABLET | ORAL | 0 refills | Status: DC

## 2018-04-10 NOTE — Patient Instructions (Signed)
Start Azithromycin antibiotics for infection. Take 2 tablets by mouth today, then 1 tablet daily for 4 additional days.  Continue Robitussin as needed for cough/congestion.  Make sure to keep her hydrated with water.   Please call us if no improvement in 3-4 days.  It was a pleasure meeting you!

## 2018-04-10 NOTE — Progress Notes (Signed)
Subjective:    Patient ID: Margaret Hicks, female    DOB: June 04, 1932, 84 y.o.   MRN: [id number]  HPI  Margaret Hicks is an 84 year old female with a history of hypertension, GERD, dementia who presents today with a chief complaint of cough. Her daughter is with her today providing information to HPI as she cannot contribute.   She also reports shortness of breath, headache, sore throat, chest congestion. Her symptoms began 10 Hicks ago. Her cough is productive. Her daughter is unsure of fevers but has noticed her mother feeling clammy. Her daughter has noticed that her symptoms have progressed.   She denies foul smelling urine, acute changes in mood or confusion, lethargy.   Review of Systems  HENT: Positive for congestion and sore throat.   Respiratory: Positive for cough and shortness of breath.   Cardiovascular: Negative for chest pain.  Genitourinary:       No foul smelling urine or incontinence  Neurological: Negative for weakness.       Daughter denies changes in mental status        Past Medical History:  Diagnosis Date  . Acute gastritis   . Arthritis   . Back pain   . Dementia   . Esophageal reflux   . GERD (gastroesophageal reflux disease)   . Hemorrhoids   . Hyperlipemia   . Hypertension   . Hypothyroidism   . Obesity   . OP (osteoporosis)   . Renal insufficiency   . Schatzki's ring   . TIA (transient ischemic attack)   . Upper GI bleed   . Urinary incontinence      Social History   Socioeconomic History  . Marital status: Widowed    Spouse name: Not on file  . Number of children: Not on file  . Years of education: Not on file  . Highest education level: Not on file  Occupational History  . Not on file  Social Needs  . Financial resource strain: Not on file  . Food insecurity:    Worry: Not on file    Inability: Not on file  . Transportation needs:    Medical: Not on file    Non-medical: Not on file  Tobacco Use  . Smoking status: Never Smoker  .  Smokeless tobacco: Never Used  Substance and Sexual Activity  . Alcohol use: No    Alcohol/week: 0.0 oz  . Drug use: No  . Sexual activity: Never  Lifestyle  . Physical activity:    Hicks per week: Not on file    Minutes per session: Not on file  . Stress: Not on file  Relationships  . Social connections:    Talks on phone: Not on file    Gets together: Not on file    Attends religious service: Not on file    Active member of club or organization: Not on file    Attends meetings of clubs or organizations: Not on file    Relationship status: Not on file  . Intimate partner violence:    Fear of current or ex partner: Not on file    Emotionally abused: Not on file    Physically abused: Not on file    Forced sexual activity: Not on file  Other Topics Concern  . Not on file  Social History Narrative  . Not on file    Past Surgical History:  Procedure Laterality Date  . ABDOMINAL HYSTERECTOMY    . CARDIAC CATHETERIZATION    .  CHOLECYSTECTOMY    . TONSILLECTOMY      Family History  Problem Relation Age of Onset  . Dementia Mother   . Stroke Mother   . Dementia Brother   . Dementia Brother   . Arthritis Sister   . Cancer Unknown        nephew  . Heart disease Father   . Colon cancer Neg Hx     Allergies  Allergen Reactions  . Simvastatin     REACTION: muscle pain/ memory issues    Current Outpatient Medications on File Prior to Visit  Medication Sig Dispense Refill  . acetaminophen (TYLENOL) 500 MG tablet Take 500 mg by mouth every 8 (eight) hours as needed for moderate pain or headache.    . Alum & Mag Hydroxide-Simeth (GERI-LANTA PO) Take 30 mLs by mouth 4 (four) times daily as needed.    . bismuth subsalicylate (PEPTO BISMOL) 262 MG/15ML suspension Take 10 mLs by mouth every 6 (six) hours as needed.    . cholecalciferol (VITAMIN D) 1000 UNITS tablet Take 1,000 Units by mouth every evening.     Marland Kitchen Dextromethorphan HBr (ROBAFEN COUGH PO) Take by mouth.    Marland Kitchen  guaiFENesin (TUSSIN) 100 MG/5ML liquid Take 200 mg by mouth daily as needed for cough.    . lansoprazole (PREVACID) 15 MG capsule Take 1 capsule (15 mg total) by mouth daily at 12 noon. 30 capsule 11  . levothyroxine (SYNTHROID, LEVOTHROID) 100 MCG tablet TAKE 1 TABLET(100 MCG) BY MOUTH DAILY BEFORE BREAKFAST 30 tablet 11  . loperamide (ANTI-DIARRHEAL) 2 MG capsule Take 2 mg by mouth as needed for diarrhea or loose stools.    Marland Kitchen losartan (COZAAR) 100 MG tablet Take 1 tablet (100 mg total) by mouth daily. In am 30 tablet 11  . magnesium hydroxide (MILK OF MAGNESIA) 400 MG/5ML suspension Take 30 mLs by mouth daily as needed for mild constipation.    . metoprolol tartrate (LOPRESSOR) 100 MG tablet Take 0.5 tablets (50 mg total) by mouth 2 (two) times daily. Am and pm 30 tablet 11  . Multiple Vitamin (MULTIVITAMIN) capsule Take 1 capsule by mouth daily.      Marland Kitchen Neomycin-Bacitracin-Polymyxin (TRIPLE ANTIBIOTIC EX) Apply 1 application topically daily as needed.    . nystatin (MYCOSTATIN/NYSTOP) powder Apply topically 4 (four) times daily. To affected areas under breasts 30 g 2  . nystatin (MYCOSTATIN/NYSTOP) powder Apply topically 4 (four) times daily. To affected areas 30 g 3  . sertraline (ZOLOFT) 50 MG tablet Take 1 tablet (50 mg total) by mouth daily. In pm 30 tablet 11  . spironolactone (ALDACTONE) 25 MG tablet Take 0.5 tablets (12.5 mg total) by mouth daily. In am 15 tablet 11   No current facility-administered medications on file prior to visit.     BP 122/74   Pulse (!) 54   Temp 98.7 F (37.1 C) (Oral)   Ht 5' 0.5" (1.537 m)   Wt 220 lb 12 oz (100.1 kg)   SpO2 96%   BMI 42.40 kg/m    Objective:   Physical Exam  Constitutional: She appears well-nourished. She does not appear ill. No distress.  Pulmonary/Chest: Effort normal. She has wheezes in the left upper field.  Neurological: She is alert.  Skin: Skin is warm and dry.          Assessment & Plan:  URI vs  Bronchitis:  Cough, congestion, shortness of breath x 10 Hicks. Exam today with mild wheezing and rhonchi as noted. Appears well  and is resting calmly. No distress or abnormal vitals. Rx for Zpak sent to pharmacy. Discussed to use Robitussin/Delsym as needed. Daughter will update if symptoms do not improve.   Doreene Nest, NP  Patient is stable for outpatient treatment.

## 2018-09-28 DIAGNOSIS — M79675 Pain in left toe(s): Secondary | ICD-10-CM | POA: Diagnosis not present

## 2018-09-28 DIAGNOSIS — L603 Nail dystrophy: Secondary | ICD-10-CM | POA: Diagnosis not present

## 2018-09-28 DIAGNOSIS — B351 Tinea unguium: Secondary | ICD-10-CM | POA: Diagnosis not present

## 2018-09-28 DIAGNOSIS — I739 Peripheral vascular disease, unspecified: Secondary | ICD-10-CM | POA: Diagnosis not present

## 2018-09-28 DIAGNOSIS — M79674 Pain in right toe(s): Secondary | ICD-10-CM | POA: Diagnosis not present

## 2018-10-05 DIAGNOSIS — Z0279 Encounter for issue of other medical certificate: Secondary | ICD-10-CM

## 2018-10-28 ENCOUNTER — Other Ambulatory Visit: Payer: Self-pay | Admitting: Family Medicine

## 2018-10-29 NOTE — Telephone Encounter (Signed)
Please schedule late winter or spring f/u and refill until then

## 2018-10-29 NOTE — Telephone Encounter (Signed)
Pt had an acute appt with Jae DireKate, NP on 04/10/18 but no recent or future CPE or F/U with PCP, please advise

## 2018-10-29 NOTE — Telephone Encounter (Signed)
Lyla SonCarrie notified daughter that pt's due for f/u before we can fill meds, I will fill once she calls back and schedules appt

## 2018-11-06 NOTE — Telephone Encounter (Signed)
Pt's daughter (on dpr) called and scheduled a follow up for the pt for 12/04/18.

## 2018-11-06 NOTE — Telephone Encounter (Signed)
Meds filled

## 2018-12-01 ENCOUNTER — Other Ambulatory Visit: Payer: Self-pay | Admitting: Family Medicine

## 2018-12-03 ENCOUNTER — Other Ambulatory Visit: Payer: Self-pay | Admitting: Family Medicine

## 2018-12-04 ENCOUNTER — Ambulatory Visit: Payer: Medicare Other | Admitting: Family Medicine

## 2018-12-11 ENCOUNTER — Telehealth: Payer: Self-pay

## 2018-12-11 ENCOUNTER — Ambulatory Visit: Payer: Medicare Other | Admitting: Family Medicine

## 2018-12-11 NOTE — Telephone Encounter (Signed)
Pt has appt with Dr Milinda Antis 12/11/18 at 10:15. FYI to [employer].

## 2018-12-11 NOTE — Telephone Encounter (Signed)
I spoke with Margaret Hicks, she had a cough attack and said she would call back to r/s appt.

## 2018-12-11 NOTE — Telephone Encounter (Signed)
[facility] Night - Client Nonclinical Telephone Record [facility] Medical Call Center Client [facility] Night - Client Client Site  [facility] - Night Physician Roxy Manns - MD Contact Type Call Who Is Calling Patient / Member / Family / Caregiver Caller Name Lawson Radar Caller Phone Number [phone] Patient Name Margaret Hicks Patient DOB 1932-02-21 Call Type Message Only Information Provided Reason for Call Request to Reschedule Office Appointment Initial Comment Caller's mother has an appointment this morning. The caller cannot take the patient to the appointment; she needs to reschedule. Additional Comment Office hours were provided. Call Closed By: Leotis Shames Transaction Date/Time: 12/11/2018 6:15:15 AM (ET)

## 2018-12-25 ENCOUNTER — Ambulatory Visit (INDEPENDENT_AMBULATORY_CARE_PROVIDER_SITE_OTHER)
Admission: RE | Admit: 2018-12-25 | Discharge: 2018-12-25 | Disposition: A | Discharge status: Home / Self Care | Payer: Medicare Other | Source: Ambulatory Visit | Attending: Family Medicine | Admitting: Family Medicine

## 2018-12-25 ENCOUNTER — Encounter: Payer: Self-pay | Admitting: Family Medicine

## 2018-12-25 ENCOUNTER — Ambulatory Visit (INDEPENDENT_AMBULATORY_CARE_PROVIDER_SITE_OTHER): Payer: Medicare Other | Admitting: Family Medicine

## 2018-12-25 VITALS — BP 132/80 | HR 53 | Temp 98.0°F | Ht 60.5 in | Wt 224.5 lb

## 2018-12-25 DIAGNOSIS — N289 Disorder of kidney and ureter, unspecified: Secondary | ICD-10-CM | POA: Diagnosis not present

## 2018-12-25 DIAGNOSIS — E785 Hyperlipidemia, unspecified: Secondary | ICD-10-CM | POA: Diagnosis not present

## 2018-12-25 DIAGNOSIS — I728 Aneurysm of other specified arteries: Secondary | ICD-10-CM | POA: Diagnosis not present

## 2018-12-25 DIAGNOSIS — E039 Hypothyroidism, unspecified: Secondary | ICD-10-CM

## 2018-12-25 DIAGNOSIS — I10 Essential (primary) hypertension: Secondary | ICD-10-CM

## 2018-12-25 DIAGNOSIS — F0390 Unspecified dementia without behavioral disturbance: Secondary | ICD-10-CM | POA: Diagnosis not present

## 2018-12-25 DIAGNOSIS — R0602 Shortness of breath: Secondary | ICD-10-CM | POA: Diagnosis not present

## 2018-12-25 DIAGNOSIS — F3289 Other specified depressive episodes: Secondary | ICD-10-CM | POA: Diagnosis not present

## 2018-12-25 DIAGNOSIS — R600 Localized edema: Secondary | ICD-10-CM

## 2018-12-25 DIAGNOSIS — L608 Other nail disorders: Secondary | ICD-10-CM | POA: Insufficient documentation

## 2018-12-25 LAB — CBC WITH DIFFERENTIAL/PLATELET
Basophils Absolute: 0 10*3/uL (ref 0.0–0.1)
Basophils Relative: 0.9 % (ref 0.0–3.0)
Eosinophils Absolute: 0.1 10*3/uL (ref 0.0–0.7)
Eosinophils Relative: 1.1 % (ref 0.0–5.0)
HCT: 40.1 % (ref 36.0–46.0)
Hemoglobin: 13.5 g/dL (ref 12.0–15.0)
LYMPHS PCT: 28.9 % (ref 12.0–46.0)
Lymphs Abs: 1.5 10*3/uL (ref 0.7–4.0)
MCHC: 33.7 g/dL (ref 30.0–36.0)
MCV: 85.3 fl (ref 78.0–100.0)
MONOS PCT: 9.1 % (ref 3.0–12.0)
Monocytes Absolute: 0.5 10*3/uL (ref 0.1–1.0)
Neutro Abs: 3 10*3/uL (ref 1.4–7.7)
Neutrophils Relative %: 60 % (ref 43.0–77.0)
Platelets: 194 10*3/uL (ref 150.0–400.0)
RBC: 4.7 Mil/uL (ref 3.87–5.11)
RDW: 14.1 % (ref 11.5–15.5)
WBC: 5 10*3/uL (ref 4.0–10.5)

## 2018-12-25 LAB — LIPID PANEL
CHOL/HDL RATIO: 4
Cholesterol: 201 mg/dL — ABNORMAL HIGH (ref 0–200)
HDL: 45.9 mg/dL (ref >39.00)
LDL Cholesterol: 117 mg/dL — ABNORMAL HIGH (ref 0–99)
NonHDL: 155.15
Triglycerides: 191 mg/dL — ABNORMAL HIGH (ref 0.0–149.0)
VLDL: 38.2 mg/dL (ref 0.0–40.0)

## 2018-12-25 LAB — TSH: TSH: 3.3 u[IU]/mL (ref 0.35–4.50)

## 2018-12-25 LAB — COMPREHENSIVE METABOLIC PANEL
ALT: 16 U/L (ref 0–35)
AST: 16 U/L (ref 0–37)
Albumin: 3.9 g/dL (ref 3.5–5.2)
Alkaline Phosphatase: 108 U/L (ref 39–117)
BUN: 30 mg/dL — ABNORMAL HIGH (ref 6–23)
CO2: 31 meq/L (ref 19–32)
Calcium: 9.9 mg/dL (ref 8.4–10.5)
Chloride: 101 mEq/L (ref 96–112)
Creatinine, Ser: 1.36 mg/dL — ABNORMAL HIGH (ref 0.40–1.20)
GFR: 36.96 mL/min — ABNORMAL LOW (ref >60.00)
Glucose, Bld: 143 mg/dL — ABNORMAL HIGH (ref 70–99)
Potassium: 4.4 mEq/L (ref 3.5–5.1)
Sodium: 138 mEq/L (ref 135–145)
Total Bilirubin: 0.6 mg/dL (ref 0.2–1.2)
Total Protein: 6.5 g/dL (ref 6.0–8.3)

## 2018-12-25 MED ORDER — LEVOTHYROXINE SODIUM 100 MCG PO TABS: 100.0000 ug | ORAL_TABLET | Freq: Every day | ORAL | 11 refills | Status: DC

## 2018-12-25 MED ORDER — LANSOPRAZOLE 15 MG PO CPDR: DELAYED_RELEASE_CAPSULE | ORAL | 11 refills | Status: DC

## 2018-12-25 MED ORDER — METOPROLOL TARTRATE 100 MG PO TABS: 50.0000 mg | ORAL_TABLET | Freq: Two times a day (BID) | ORAL | 11 refills | Status: DC

## 2018-12-25 MED ORDER — LOSARTAN POTASSIUM 100 MG PO TABS: 100.0000 mg | ORAL_TABLET | Freq: Every day | ORAL | 0 refills | Status: DC

## 2018-12-25 MED ORDER — SPIRONOLACTONE 25 MG PO TABS: 12.5000 mg | ORAL_TABLET | Freq: Every morning | ORAL | 11 refills | Status: DC

## 2018-12-25 MED ORDER — SERTRALINE HCL 50 MG PO TABS: 50.0000 mg | ORAL_TABLET | Freq: Every day | ORAL | 11 refills | Status: DC

## 2018-12-25 NOTE — Assessment & Plan Note (Signed)
Worse lately  No redness or signs of cellulitis Enc use of recliner if sitting to force feet up  She will not use supp hose  Lab today  Also sob- cxr today  ? If needs echo in future

## 2018-12-25 NOTE — Assessment & Plan Note (Signed)
TSH today  More sob and edema lately  Good appetite

## 2018-12-25 NOTE — Assessment & Plan Note (Signed)
Has not seen renal in a while Lab today  If worse- would consider a return  Enc fluids -with dementia she forgets to drink

## 2018-12-25 NOTE — Assessment & Plan Note (Signed)
Disc goals for lipids and reasons to control them Rev last labs with pt Rev low sat fat diet in detail Labs today  Intol of statin  Eating at facility- ? If low fat

## 2018-12-25 NOTE — Assessment & Plan Note (Signed)
Continues to do very well with sertraline  Also dementia Stays cheerful

## 2018-12-25 NOTE — Assessment & Plan Note (Signed)
In the past week or two  Need to get wt  cxr today (no wheeze on exam but family has heard ? Wheeze) More edema  Also wt gain/deconditioning

## 2018-12-25 NOTE — Patient Instructions (Signed)
Labs today  Chest xray today  I will place a podiatry referral as well   Try to encourage leg elevation when able (recliner?) Also water intake when able

## 2018-12-25 NOTE — Assessment & Plan Note (Signed)
No clinical symptoms or changes

## 2018-12-25 NOTE — Assessment & Plan Note (Signed)
Gradually progressive No safety concerns at her facility  Still good appetite  Forgets to drink fluids

## 2018-12-25 NOTE — Progress Notes (Signed)
Subjective:    Patient ID: Margaret Hicks, female    DOB: 26-Aug-1932, 85 y.o.   MRN: [id number]  HPI  Here for f/u of chronic health problems   More trouble breathing lately  02 fluctuates  At rest or when active  Also more swelling in ankles  Not moving as much - moved to different area of facility  Perhaps a bit of deconditioning   Hears wheezing in the past week or two Not coughing  No uri symptoms that they can tell  Never smoked (did live in a house with smokers)    Wt Readings from Last 3 Encounters:  12/25/18 224 lb 8 oz (101.8 kg)  04/10/18 220 lb 12 oz (100.1 kg)  11/03/17 220 lb 12 oz (100.1 kg)   43.12 kg/m   Appetite is fine   bp is stable today  No cp or palpitations or headaches or edema  No side effects to medicines  BP Readings from Last 3 Encounters:  12/25/18 132/80  04/10/18 122/74  11/03/17 128/82    Losartan Metoprolol Aldactone   Pulse Readings from Last 3 Encounters:  12/25/18 (!) 53  04/10/18 (!) 54  11/03/17 (!) 55     Mild renal insuff in the past Lab Results  Component Value Date   CREATININE 1.21 (H) 11/03/2017   BUN 20 11/03/2017   NA 136 11/03/2017   K 4.3 11/03/2017   CL 101 11/03/2017   CO2 31 11/03/2017   Due for labs  Hard to get her to drink water  Has not seen renal in a long time   ? If there is one in [place]    More swelling in legs lately  Takes sertraline for depression  Generally happy and playful   H/o splenic artery aneurysm   Hypothyroidism  Pt has no clinical changes No change in energy level/ hair or skin/ edema and no tremor Lab Results  Component Value Date   TSH 3.74 11/03/2017    Due for labs   Dementia -gradually progressive   Hyperlipidemia Lab Results  Component Value Date   CHOL 182 11/03/2017   HDL 50.80 11/03/2017   LDLCALC 103 (H) 11/03/2017   LDLDIRECT 126.0 09/16/2016   TRIG 140.0 11/03/2017   CHOLHDL 4 11/03/2017  in the past intol to simvastatin  Due for labs    Controlled with diet  More carbs in her facility [facility] house)   Health [facility]  Breast cancer screening - not interested in mammograms at this time due to age   Had her flu shot   Patient Active Problem List   Diagnosis Date Noted  . Short of breath on exertion 12/25/2018  . Toenail deformity 12/25/2018  . Foot deformity, acquired 11/25/2016  . Morbid obesity (HCC) 09/26/2016  . Mobility impaired 09/17/2014  . Encounter for Medicare annual wellness exam 09/01/2014  . Internal hemorrhoids 08/27/2014  . Pedal edema 08/12/2013  . Dysphagia, unspecified(787.20) 05/29/2013  . Aneurysm, splenic artery (HCC) 05/29/2013  . Senile dementia, uncomplicated (HCC) 01/09/2013  . Back pain, thoracic 11/28/2012  . Depression 11/28/2012  . Other screening mammogram 11/28/2012  . EXTERNAL HEMORRHOIDS 01/26/2011  . GERD 09/01/2010  . Hypothyroidism 05/27/2010  . Hyperlipidemia LDL goal <100 05/27/2010  . Essential hypertension 05/27/2010  . Mild renal insufficiency 05/27/2010  . Unspecified urinary incontinence 05/27/2010  . Personal history of urinary disorder 05/27/2010   Past Medical History:  Diagnosis Date  . Acute gastritis   . Arthritis   .  Back pain   . Dementia (HCC)   . Esophageal reflux   . GERD (gastroesophageal reflux disease)   . Hemorrhoids   . Hyperlipemia   . Hypertension   . Hypothyroidism   . Obesity   . OP (osteoporosis)   . Renal insufficiency   . Schatzki's ring   . TIA (transient ischemic attack)   . Upper GI bleed   . Urinary incontinence    Past Surgical History:  Procedure Laterality Date  . ABDOMINAL HYSTERECTOMY    . CARDIAC CATHETERIZATION    . CHOLECYSTECTOMY    . TONSILLECTOMY     Social History   Tobacco Use  . Smoking status: Never Smoker  . Smokeless tobacco: Never Used  Substance Use Topics  . Alcohol use: No    Alcohol/week: 0.0 standard drinks  . Drug use: No   Family History  Problem Relation Age of Onset  . Dementia Mother    . Stroke Mother   . Dementia Brother   . Dementia Brother   . Arthritis Sister   . Cancer Unknown        nephew  . Heart disease Father   . Colon cancer Neg Hx    Allergies  Allergen Reactions  . Simvastatin     REACTION: muscle pain/ memory issues   Current Outpatient Medications on File Prior to Visit  Medication Sig Dispense Refill  . acetaminophen (TYLENOL) 500 MG tablet Take 500 mg by mouth every 8 (eight) hours as needed for moderate pain or headache.    . Alum & Mag Hydroxide-Simeth (GERI-LANTA PO) Take 30 mLs by mouth 4 (four) times daily as needed.    . bismuth subsalicylate (PEPTO BISMOL) 262 MG/15ML suspension Take 10 mLs by mouth every 6 (six) hours as needed.    . cholecalciferol (VITAMIN D) 1000 UNITS tablet Take 1,000 Units by mouth every evening.     Marland Kitchen. Dextromethorphan HBr (ROBAFEN COUGH PO) Take by mouth.    Marland Kitchen. guaiFENesin (TUSSIN) 100 MG/5ML liquid Take 200 mg by mouth daily as needed for cough.    . loperamide (ANTI-DIARRHEAL) 2 MG capsule Take 2 mg by mouth as needed for diarrhea or loose stools.    . magnesium hydroxide (MILK OF MAGNESIA) 400 MG/5ML suspension Take 30 mLs by mouth daily as needed for mild constipation.    . Multiple Vitamin (MULTIVITAMIN) capsule Take 1 capsule by mouth daily.      Marland Kitchen. Neomycin-Bacitracin-Polymyxin (TRIPLE ANTIBIOTIC EX) Apply 1 application topically daily as needed.    . nystatin (MYCOSTATIN/NYSTOP) powder Apply topically 4 (four) times daily. To affected areas under breasts 30 g 2  . nystatin (MYCOSTATIN/NYSTOP) powder Apply topically 4 (four) times daily. To affected areas 30 g 3   No current facility-administered medications on file prior to visit.      Review of Systems  Constitutional: Negative for activity change, appetite change, fatigue, fever and unexpected weight change.  HENT: Negative for congestion, ear pain, rhinorrhea, sinus pressure and sore throat.   Eyes: Negative for pain, redness and visual disturbance.   Respiratory: Positive for shortness of breath. Negative for apnea, cough, chest tightness and wheezing.   Cardiovascular: Positive for leg swelling. Negative for chest pain and palpitations.  Gastrointestinal: Negative for abdominal pain, blood in stool, constipation and diarrhea.  Endocrine: Negative for polydipsia and polyuria.  Genitourinary: Negative for dysuria, frequency and urgency.  Musculoskeletal: Negative for arthralgias, back pain and myalgias.  Skin: Negative for pallor and rash.  Allergic/Immunologic: Negative for environmental  allergies.  Neurological: Negative for dizziness, syncope and headaches.  Hematological: Negative for adenopathy. Does not bruise/bleed easily.  Psychiatric/Behavioral: Positive for decreased concentration. Negative for dysphoric mood. The patient is not nervous/anxious.        Dementia  Confusion -baseline but usually cheerful        Objective:   Physical Exam Constitutional:      General: She is not in acute distress.    Appearance: Normal appearance. She is well-developed. She is obese. She is not ill-appearing.  HENT:     Head: Normocephalic and atraumatic.     Nose: Nose normal.     Mouth/Throat:     Mouth: Mucous membranes are moist.     Pharynx: Oropharynx is clear.  Eyes:     General: No scleral icterus.       Right eye: No discharge.     Conjunctiva/sclera: Conjunctivae normal.     Pupils: Pupils are equal, round, and reactive to light.  Neck:     Musculoskeletal: Normal range of motion and neck supple. No neck rigidity.     Thyroid: No thyromegaly.     Vascular: No carotid bruit or JVD.  Cardiovascular:     Rate and Rhythm: Regular rhythm. Bradycardia present.     Heart sounds: Normal heart sounds. No gallop.   Pulmonary:     Effort: Pulmonary effort is normal. No respiratory distress.     Breath sounds: Normal breath sounds. No wheezing or rales.     Comments: Dec bs at bases (difficult exam however due to inability to  follow directions for breathing) No wheezing   Abdominal:     General: Bowel sounds are normal. There is no distension or abdominal bruit.     Palpations: Abdomen is soft. There is no mass.     Tenderness: There is no abdominal tenderness.  Musculoskeletal:     Right lower leg: Edema present.     Left lower leg: Edema present.     Comments: One plus pedal edema  Foot deformity with hammer toes   Lymphadenopathy:     Cervical: No cervical adenopathy.  Skin:    General: Skin is warm and dry.     Capillary Refill: Capillary refill takes less than 2 seconds.     Findings: No rash.     Comments: Overgrown thick toe nails   Neurological:     Mental Status: She is alert. Mental status is at baseline.     Deep Tendon Reflexes: Reflexes are normal and symmetric.  Psychiatric:        Cognition and Memory: Cognition is impaired. Memory is impaired.     Comments: Cheerful  Nods and smiles  Occasionally vocalizes            Assessment & Plan:   Problem List Items Addressed This Visit      Cardiovascular and Mediastinum   Essential hypertension - Primary    bp in fair control at this time  BP Readings from Last 1 Encounters:  12/25/18 132/80   No changes needed Most recent labs reviewed  Disc lifstyle change with low sodium diet and exercise  Labs today      Relevant Medications   losartan (COZAAR) 100 MG tablet   metoprolol tartrate (LOPRESSOR) 100 MG tablet   spironolactone (ALDACTONE) 25 MG tablet   Other Relevant Orders   CBC with Differential/Platelet (Completed)   Comprehensive metabolic panel (Completed)   Lipid panel (Completed)   TSH (Completed)   Aneurysm, splenic  artery (HCC)    No clinical symptoms or changes       Relevant Medications   losartan (COZAAR) 100 MG tablet   metoprolol tartrate (LOPRESSOR) 100 MG tablet   spironolactone (ALDACTONE) 25 MG tablet     Endocrine   Hypothyroidism    TSH today  More sob and edema lately  Good appetite         Relevant Medications   levothyroxine (SYNTHROID, LEVOTHROID) 100 MCG tablet   metoprolol tartrate (LOPRESSOR) 100 MG tablet   Other Relevant Orders   TSH (Completed)     Nervous and Auditory   Senile dementia, uncomplicated (HCC)    Gradually progressive No safety concerns at her facility  Still good appetite  Forgets to drink fluids       Relevant Medications   sertraline (ZOLOFT) 50 MG tablet     Musculoskeletal and Integument   Toenail deformity    Curved /thick nails- hard to trim  Causes pain  Ref to podiatry for help with this      Relevant Orders   Ambulatory referral to Podiatry     Genitourinary   Mild renal insufficiency    Has not seen renal in a while Lab today  If worse- would consider a return  Enc fluids -with dementia she forgets to drink        Other   Hyperlipidemia LDL goal <100    Disc goals for lipids and reasons to control them Rev last labs with pt Rev low sat fat diet in detail Labs today  Intol of statin  Eating at facility- ? If low fat      Relevant Medications   losartan (COZAAR) 100 MG tablet   metoprolol tartrate (LOPRESSOR) 100 MG tablet   spironolactone (ALDACTONE) 25 MG tablet   Other Relevant Orders   Lipid panel (Completed)   Depression    Continues to do very well with sertraline  Also dementia Stays cheerful      Relevant Medications   sertraline (ZOLOFT) 50 MG tablet   Pedal edema    Worse lately  No redness or signs of cellulitis Enc use of recliner if sitting to force feet up  She will not use supp hose  Lab today  Also sob- cxr today  ? If needs echo in future      Morbid obesity (HCC)    Weight may be adding to mobility issues and sob  D/w family - unsure if facility can alter her diet  Not as active with age and dementia       Short of breath on exertion    In the past week or two  Need to get wt  cxr today (no wheeze on exam but family has heard ? Wheeze) More edema  Also wt  gain/deconditioning      Relevant Orders   DG Chest 2 View

## 2018-12-25 NOTE — Assessment & Plan Note (Signed)
Curved /thick nails- hard to trim  Causes pain  Ref to podiatry for help with this

## 2018-12-25 NOTE — Assessment & Plan Note (Signed)
bp in fair control at this time  BP Readings from Last 1 Encounters:  12/25/18 132/80   No changes needed Most recent labs reviewed  Disc lifstyle change with low sodium diet and exercise  Labs today

## 2018-12-25 NOTE — Assessment & Plan Note (Signed)
Weight may be adding to mobility issues and sob  D/w family - unsure if facility can alter her diet  Not as active with age and dementia

## 2018-12-26 ENCOUNTER — Other Ambulatory Visit (INDEPENDENT_AMBULATORY_CARE_PROVIDER_SITE_OTHER): Payer: Medicare Other

## 2018-12-26 DIAGNOSIS — R7309 Other abnormal glucose: Secondary | ICD-10-CM | POA: Diagnosis not present

## 2018-12-26 LAB — HEMOGLOBIN A1C: Hgb A1c MFr Bld: 5.8 % (ref 4.6–6.5)

## 2018-12-31 ENCOUNTER — Telehealth: Payer: Self-pay | Admitting: Family Medicine

## 2018-12-31 MED ORDER — ALBUTEROL SULFATE (2.5 MG/3ML) 0.083% IN NEBU: 2.5000 mg | INHALATION_SOLUTION | Freq: Four times a day (QID) | RESPIRATORY_TRACT | 3 refills | Status: DC | PRN

## 2018-12-31 NOTE — Telephone Encounter (Signed)
Order sent to [facility] house and daughter advises Rxs ready for pick up

## 2018-12-31 NOTE — Telephone Encounter (Signed)
-----   Message from Judy Pimple, MD sent at 12/28/2018 12:40 PM EST ----- Thanks- please let them know I am not in to print it today but will do it Monday when I am in the office  I will send this back to myself

## 2018-12-31 NOTE — Telephone Encounter (Signed)
Done and in IN box 

## 2019-01-01 DIAGNOSIS — I739 Peripheral vascular disease, unspecified: Secondary | ICD-10-CM | POA: Diagnosis not present

## 2019-01-01 DIAGNOSIS — M79674 Pain in right toe(s): Secondary | ICD-10-CM | POA: Diagnosis not present

## 2019-01-01 DIAGNOSIS — B351 Tinea unguium: Secondary | ICD-10-CM | POA: Diagnosis not present

## 2019-01-01 DIAGNOSIS — M79675 Pain in left toe(s): Secondary | ICD-10-CM | POA: Diagnosis not present

## 2019-01-01 DIAGNOSIS — L603 Nail dystrophy: Secondary | ICD-10-CM | POA: Diagnosis not present

## 2019-01-29 ENCOUNTER — Other Ambulatory Visit: Payer: Self-pay | Admitting: Family Medicine

## 2019-02-13 MED ORDER — ALBUTEROL SULFATE (2.5 MG/3ML) 0.083% IN NEBU: 2.5000 mg | INHALATION_SOLUTION | RESPIRATORY_TRACT | 3 refills | Status: DC | PRN

## 2019-02-13 NOTE — Telephone Encounter (Signed)
That is ok to change to a scheduled med  Do they want Q 4 hours or Q 6 hours? ATC or just when awake?

## 2019-02-13 NOTE — Telephone Encounter (Signed)
[name] nurse with [facility] assisted living for [facility] left v/m that albuterol for wheezing is prn and [name] request to change to a scheduled med. Andrey Campanile said pt is a little worse. [name] request cb.

## 2019-02-13 NOTE — Telephone Encounter (Signed)
That is fine -please change to Q 4 hours while awake -please change in med list or send in px if needed Thanks

## 2019-02-13 NOTE — Telephone Encounter (Signed)
Spoke with [place] and advised. She took verbal change and will fax over RX order to be signed and faxed back to them. Will give to Dr. Milinda Antis once we receive it.

## 2019-02-13 NOTE — Telephone Encounter (Signed)
Spoke with Donaldson.  She states patient is currently doing nebulizer treatments q 6 hours and she wants to know if Dr. Milinda Antis thinks doing them every 4 hours while patient is awake would be okay.  She states patient is just wheezing more but is not febrile.

## 2019-02-13 NOTE — Addendum Note (Signed)
Addended by: Consuella Lose on: 02/13/2019 04:37 PM   Modules accepted: Orders

## 2019-02-20 ENCOUNTER — Telehealth: Payer: Self-pay

## 2019-02-20 MED ORDER — CEPHALEXIN 500 MG PO CAPS: 500.0000 mg | ORAL_CAPSULE | Freq: Three times a day (TID) | ORAL | 0 refills | Status: DC

## 2019-02-20 NOTE — Telephone Encounter (Signed)
I sent keflex tid for 5 days Keep me posted

## 2019-02-20 NOTE — Telephone Encounter (Signed)
Spoke with Cornelius Moras, RN and she said it will almost be impossible to get a urine sample. Pt is incontinent so only way they would be able to get a urine sample would be a in/out cath but pt is so confused/combative they said that they wouldn't feel comfortable doing that, Andrey Campanile, RN asked if Dr. Milinda Antis would be opened to just giving her abx just to be on the safe side to see if that would help? Without getting the urine sample.   If approved she request that we call her daughter to see what pharmacy she would want to use

## 2019-02-20 NOTE — Telephone Encounter (Signed)
Received inbound call from Cornelius Moras, nurse @ [facility], reporting patient has become combative when assistance is provided with ADL's, particularly showers. Nurse is requesting some suggestions on how to best assist patient with ADLs.  Notified daughter [name] of communication based on note in EMR. Daughter was not aware that this was occurring but stated she was going to call facility.  Nurse Manson Passey can be contacted at [phone].

## 2019-02-20 NOTE — Telephone Encounter (Signed)
Please check in with her daughter re: what is going on  (sounds like she was going to investigate)  Any signs of infection like uti or uri that could cause increased combativeness or confusion?  Thanks -let me know

## 2019-02-20 NOTE — Telephone Encounter (Signed)
All good thoughts  If the nsg home could get a ua/cx that would be helpful (please ask)  Most likely this is a progression of dementia but would hate to miss a uti  PT would help if she is weak- but unsure if they would be able to get her to comply   If this is due to dementia -would [name] like me to disc options for treatment ? (unfortuately all have side effects)  Thanks

## 2019-02-20 NOTE — Telephone Encounter (Signed)
[name] called nursing home and what they are saying is that now when they want to get pt up to eat/change/or bath she is really combative. It's mostly when they get her up she becomes combative. Daughter thinks it could could be 3 different thinks but she's not sure but this is just her thoughts.   1.  It could just be the progression of her dementia 2.  It could be a UTI but she has no other sxs besides combativeness (also no URI sxs at all) 3.  Pt is mostly sitting during the day so maybe her legs and feet are becoming weaker so it might actually hurt pt to move  Daughter wants Dr. Royden Purl input

## 2019-02-20 NOTE — Telephone Encounter (Signed)
Please check in with her daughter about that - not optimal re: if we have no culture would not know if we gave her the right thing? Would it work to just put a nun's hat over the commode the next time she goes?  Thanks

## 2019-02-20 NOTE — Telephone Encounter (Signed)
Andrey Campanile, RN notified Rx sent and also daughter they will keep [place] posted

## 2019-02-20 NOTE — Telephone Encounter (Signed)
Pt is 100% incontinent she wears a depend that the staff change she doesn't use the bathroom at all.  Spoke with daughter and relayed Dr. Royden Purl comments. Daughter [name] said she would like to try an abx just to see if that helps her sxs she understands that without a culture we can't say if she is on the right abx or not. Daughter agrees that PT would also be hard with pt's dementia/ combativeness, and she doesn't want to start any dementia meds right now unless that's the last option, please send in abx to [facility] if agreeable

## 2019-03-07 ENCOUNTER — Other Ambulatory Visit: Payer: Self-pay | Admitting: *Deleted

## 2019-03-07 DIAGNOSIS — R062 Wheezing: Secondary | ICD-10-CM

## 2019-03-07 NOTE — Telephone Encounter (Signed)
PA done at [email or web address] for nebulizer albuterol, I will await a response

## 2019-03-14 NOTE — Telephone Encounter (Signed)
Spoke with [name] and advised of below. They will try Medicare Part B maybe next time, they will let us know at that time because a new RX with DX code will need to be sent in. [name] said with coupon for 2 months supply is like $38 so it was not bad.  [name] also asked if [facility] has contacted you about patient to let you know that antibiotic that was prescribed earlier this month is not helpful and they want to see if patient can try a mild pain medication. Patient gives care takers hard time (see other message from earlier this month). CB to [name] is [phone].

## 2019-03-14 NOTE — Telephone Encounter (Signed)
Patient's Daughter in law, Lawson Radar, is returning a call from the office   BEST PHONE-  [phone]

## 2019-03-14 NOTE — Telephone Encounter (Signed)
PA denied for coverage under Part D plan but suppose to be covered under Medicare part B. Called and left a message for patient's daughter, Lawson Radar, to discuss. I called [facility] in [place] and spoke with pharmacist and was told that patient's nebulizer RX has been filled with senior care discount card and not using insurance. Is may be much cheaper if patient can use part B. Patient's daughter can call in 10 days or longer to try and see if insurance will cover. RX does need to be re sent with DX code on it at that time. Need to know how daughter wold like to proceed at this time.

## 2019-03-15 DIAGNOSIS — R062 Wheezing: Secondary | ICD-10-CM | POA: Insufficient documentation

## 2019-03-15 MED ORDER — ALBUTEROL SULFATE (2.5 MG/3ML) 0.083% IN NEBU: 2.5000 mg | INHALATION_SOLUTION | RESPIRATORY_TRACT | 3 refills | Status: DC | PRN

## 2019-03-15 MED ORDER — TRAMADOL HCL 50 MG PO TABS: 25.0000 mg | ORAL_TABLET | Freq: Two times a day (BID) | ORAL | 1 refills | Status: DC | PRN

## 2019-03-15 NOTE — Addendum Note (Signed)
Addended by: Roxy Manns A on: 03/15/2019 03:50 PM   Modules accepted: Orders

## 2019-03-15 NOTE — Telephone Encounter (Signed)
She is already taking tylenol per chart I sent tramadol to give with GREAT caution of sedation /falls/dizziness  If it changes her mental status-stop it as well  Update me and let's see how it goes 1/2 to 1 pill up to bid prn  Also watch for constipation

## 2019-03-15 NOTE — Telephone Encounter (Signed)
I pended the albuterol because unsure of pharmacy -please send   Do they want to in/out cath for urine to send for cx?  (in the case that there is a uti and keflex does not cover?   Did they recommend pain medication because they think pain is causing her agitation ? (is she c/o pain so that is why she will not cooperate with bathing, etc?)  Thanks

## 2019-03-15 NOTE — Addendum Note (Signed)
Addended by: Barrington Ellison C on: 03/15/2019 03:08 PM   Modules accepted: Orders

## 2019-03-15 NOTE — Telephone Encounter (Signed)
Spoke with Margaret Hicks regarding Tramadol prescription.   Margaret Hicks is very afraid of facilities ability to safely administer this medication and does NOT want it to be given.  She has a lot of fears about anything strong and feels that something like a "happy pill" would be safer to use.  In clarification, something like alprazolam.  I explained to patient's daughter that this medication will carry the same risks and concerns and would be just, if not more, of a safety concern.   Patient's daughter requests to talk through the options directly with Dr. Milinda Antis rather than through nurse/CMA.   She understands that it may be next week before Dr. Milinda Antis can call and speak with her directly.  In the meantime [facility] have [facility] and cancelled the Tramadol script as daughter does not want to get it filled for patient.   Will forward to Dr. Milinda Antis making her aware that daughter Margaret Hicks would like to speak directly with her.  Paulas best contact number is [phone].

## 2019-03-15 NOTE — Telephone Encounter (Signed)
Pt returned your call Best number [phone]

## 2019-03-15 NOTE — Telephone Encounter (Signed)
Spoke with [name].  1. RX sent in to [facility] per dter's request.   2.  Daughter states that initially the thought was to treat for a UTI to see if related to her behaviors.  Nothing has changed and her agitation mainly centers around the time that they try to move her mother and they are concerned this is more pain related than UTI related.    Daughter and care team feel like UTI has been ruled out and they would like to look at pain control options next to see if that helps.    Daughter says her mother deals with pain mostly in her legs and feet and appears effected most by it with ambulation.   [name] is requesting Dr. Milinda Antis recommend something for pain control to try for next couple of weeks and then go from there.   If an acute R/X for pain needs to be sent in please send to [facility] and let [name] know so that she can pick it up.  Otherwise if just OTC to use please send/call order to the care team [facility].   Please let her know what Dr. Milinda Antis advises.

## 2019-03-15 NOTE — Telephone Encounter (Signed)
LM on Margaret Hicks's cell VM requesting that she call us back to discuss her concerns further and also to provide name of pharmacy to send in albuterol r/x to.

## 2019-03-15 NOTE — Addendum Note (Signed)
Addended by: Barrington Ellison C on: 03/15/2019 04:19 PM   Modules accepted: Orders

## 2019-03-15 NOTE — Addendum Note (Signed)
Addended by: Roxy Manns A on: 03/15/2019 10:05 AM   Modules accepted: Orders

## 2019-03-20 ENCOUNTER — Encounter (HOSPITAL_COMMUNITY): Payer: Self-pay

## 2019-03-20 ENCOUNTER — Emergency Department (HOSPITAL_COMMUNITY): Payer: Medicare Other

## 2019-03-20 ENCOUNTER — Emergency Department (HOSPITAL_COMMUNITY)
Admission: EM | Admit: 2019-03-20 | Discharge: 2019-03-21 | Disposition: A | Discharge status: Home / Self Care | Payer: Medicare Other | Attending: Emergency Medicine | Admitting: Emergency Medicine

## 2019-03-20 ENCOUNTER — Other Ambulatory Visit: Payer: Self-pay

## 2019-03-20 DIAGNOSIS — S0990XA Unspecified injury of head, initial encounter: Secondary | ICD-10-CM | POA: Diagnosis not present

## 2019-03-20 DIAGNOSIS — Y92129 Unspecified place in nursing home as the place of occurrence of the external cause: Secondary | ICD-10-CM | POA: Insufficient documentation

## 2019-03-20 DIAGNOSIS — Y939 Activity, unspecified: Secondary | ICD-10-CM | POA: Insufficient documentation

## 2019-03-20 DIAGNOSIS — S199XXA Unspecified injury of neck, initial encounter: Secondary | ICD-10-CM | POA: Diagnosis not present

## 2019-03-20 DIAGNOSIS — Z79899 Other long term (current) drug therapy: Secondary | ICD-10-CM | POA: Insufficient documentation

## 2019-03-20 DIAGNOSIS — S8002XA Contusion of left knee, initial encounter: Secondary | ICD-10-CM | POA: Insufficient documentation

## 2019-03-20 DIAGNOSIS — W050XXA Fall from non-moving wheelchair, initial encounter: Secondary | ICD-10-CM | POA: Diagnosis not present

## 2019-03-20 DIAGNOSIS — F0390 Unspecified dementia without behavioral disturbance: Secondary | ICD-10-CM | POA: Insufficient documentation

## 2019-03-20 DIAGNOSIS — Y999 Unspecified external cause status: Secondary | ICD-10-CM | POA: Insufficient documentation

## 2019-03-20 DIAGNOSIS — T148XXA Other injury of unspecified body region, initial encounter: Secondary | ICD-10-CM

## 2019-03-20 DIAGNOSIS — E039 Hypothyroidism, unspecified: Secondary | ICD-10-CM | POA: Diagnosis not present

## 2019-03-20 DIAGNOSIS — I10 Essential (primary) hypertension: Secondary | ICD-10-CM | POA: Insufficient documentation

## 2019-03-20 DIAGNOSIS — S80912A Unspecified superficial injury of left knee, initial encounter: Secondary | ICD-10-CM | POA: Diagnosis not present

## 2019-03-20 DIAGNOSIS — W19XXXA Unspecified fall, initial encounter: Secondary | ICD-10-CM

## 2019-03-20 DIAGNOSIS — S8992XA Unspecified injury of left lower leg, initial encounter: Secondary | ICD-10-CM | POA: Diagnosis not present

## 2019-03-20 DIAGNOSIS — S79912A Unspecified injury of left hip, initial encounter: Secondary | ICD-10-CM | POA: Diagnosis not present

## 2019-03-20 DIAGNOSIS — S79911A Unspecified injury of right hip, initial encounter: Secondary | ICD-10-CM | POA: Diagnosis not present

## 2019-03-20 NOTE — ED Notes (Signed)
Lawson Radar [phone] daughter in law

## 2019-03-20 NOTE — ED Notes (Signed)
This RN asked prior RN if family was contacted.  She did so at this time, unable to contact anyone at this time per prior RN.

## 2019-03-20 NOTE — ED Notes (Signed)
Phone call from daughter in law, stated that they were waiting out in their car and had no phone call from RN on what was going on with patient.  Gave up date on patient, daughter in law very angry, states that 3 phone numbers were given to EMS to be given to RN for updates.  Daughter in law stated that she also fell on hips and hips need to be xrayed.  PA notified.  Patient stopped from going out the door with [facility] at this time.  New orders per PA.  Assured daughter in law that I would call back and give up date.

## 2019-03-20 NOTE — ED Triage Notes (Signed)
3pm fell out of WC at [facility]. L knee bruise GCS 14 Hx: dementia (at BL right now) 98.8

## 2019-03-20 NOTE — ED Notes (Signed)
Next on ptar list

## 2019-03-20 NOTE — ED Provider Notes (Signed)
[facility] Provider Note   CSN: [id number] Arrival date & time: 03/20/19  1919    History   Chief Complaint No chief complaint on file.   HPI Margaret Hicks is a 85 y.o. female with advanced dementia who lives at [facility].  She is generally nonambulatory in her wheelchair.  She apparently slipped out of her wheelchair today and fell onto her left knee at around 3 PM.  She had significant bruising and swelling and was sent here for further evaluation.  There is a level 5 caveat due to dementia.     HPI  Past Medical History:  Diagnosis Date   Acute gastritis    Arthritis    Back pain    Dementia (HCC)    Esophageal reflux    GERD (gastroesophageal reflux disease)    Hemorrhoids    Hyperlipemia    Hypertension    Hypothyroidism    Obesity    OP (osteoporosis)    Renal insufficiency    Schatzki's ring    TIA (transient ischemic attack)    Upper GI bleed    Urinary incontinence     Patient Active Problem List   Diagnosis Date Noted   Wheezing 03/15/2019   Short of breath on exertion 12/25/2018   Toenail deformity 12/25/2018   Foot deformity, acquired 11/25/2016   Morbid obesity (HCC) 09/26/2016   Mobility impaired 09/17/2014   Encounter for Medicare annual wellness exam 09/01/2014   Internal hemorrhoids 08/27/2014   Pedal edema 08/12/2013   Dysphagia, unspecified(787.20) 05/29/2013   Aneurysm, splenic artery (HCC) 05/29/2013   Senile dementia, uncomplicated (HCC) 01/09/2013   Back pain, thoracic 11/28/2012   Depression 11/28/2012   Other screening mammogram 11/28/2012   EXTERNAL HEMORRHOIDS 01/26/2011   GERD 09/01/2010   Hypothyroidism 05/27/2010   Hyperlipidemia LDL goal <100 05/27/2010   Essential hypertension 05/27/2010   Mild renal insufficiency 05/27/2010   Unspecified urinary incontinence 05/27/2010   Personal history of urinary disorder  05/27/2010    Past Surgical History:  Procedure Laterality Date   ABDOMINAL HYSTERECTOMY     CARDIAC CATHETERIZATION     CHOLECYSTECTOMY     TONSILLECTOMY       OB History   No obstetric history on file.      Home Medications    Prior to Admission medications   Medication Sig Start Date End Date Taking? Authorizing Provider  acetaminophen (TYLENOL) 500 MG tablet Take 500 mg by mouth every 8 (eight) hours as needed for moderate pain or headache.    [provider]  albuterol (PROVENTIL) (2.5 MG/3ML) 0.083% nebulizer solution Take 3 mLs (2.5 mg total) by nebulization every 4 (four) hours as needed for wheezing or shortness of breath. Diagnosis wheezing R06.2 03/15/19   Tower, Audrie GallusMarne A, MD  Alum & Mag Hydroxide-Simeth (GERI-LANTA PO) Take 30 mLs by mouth 4 (four) times daily as needed.    [provider]  bismuth subsalicylate (PEPTO BISMOL) 262 MG/15ML suspension Take 10 mLs by mouth every 6 (six) hours as needed.    [provider]  cephALEXin (KEFLEX) 500 MG capsule Take 1 capsule (500 mg total) by mouth 3 (three) times daily. 02/20/19   Tower, Audrie GallusMarne A, MD  cholecalciferol (VITAMIN D) 1000 UNITS tablet Take 1,000 Units by mouth every evening.     [provider]  Dextromethorphan HBr (ROBAFEN COUGH PO) Take by mouth.    [provider]  guaiFENesin (TUSSIN) 100 MG/5ML liquid Take  200 mg by mouth daily as needed for cough.    [provider]  lansoprazole (PREVACID) 15 MG capsule Take 1 capsule by mouth daily at 12PM. 12/25/18   Tower, Audrie Gallus, MD  levothyroxine (SYNTHROID, LEVOTHROID) 100 MCG tablet Take 1 tablet (100 mcg total) by mouth daily before breakfast. 12/25/18   Tower, Audrie Gallus, MD  loperamide (ANTI-DIARRHEAL) 2 MG capsule Take 2 mg by mouth as needed for diarrhea or loose stools.    [provider]  losartan (COZAAR) 100 MG tablet Take 1 tablet (100 mg total) by mouth daily. In am 01/29/19   Tower, Audrie Gallus, MD    magnesium hydroxide (MILK OF MAGNESIA) 400 MG/5ML suspension Take 30 mLs by mouth daily as needed for mild constipation.    [provider]  metoprolol tartrate (LOPRESSOR) 100 MG tablet Take 0.5 tablets (50 mg total) by mouth 2 (two) times daily. Am and pm 12/25/18   Tower, Audrie Gallus, MD  Multiple Vitamin (MULTIVITAMIN) capsule Take 1 capsule by mouth daily.      [provider]  Neomycin-Bacitracin-Polymyxin (TRIPLE ANTIBIOTIC EX) Apply 1 application topically daily as needed.    [provider]  nystatin (MYCOSTATIN/NYSTOP) powder Apply topically 4 (four) times daily. To affected areas under breasts 08/24/16   Tower, Audrie Gallus, MD  nystatin (MYCOSTATIN/NYSTOP) powder Apply topically 4 (four) times daily. To affected areas 12/14/16   Tower, Audrie Gallus, MD  sertraline (ZOLOFT) 50 MG tablet Take 1 tablet (50 mg total) by mouth daily. In pm 12/25/18   Tower, Audrie Gallus, MD  spironolactone (ALDACTONE) 25 MG tablet Take 0.5 tablets (12.5 mg total) by mouth every morning. 12/25/18   Tower, Audrie Gallus, MD    Family History Family History  Problem Relation Age of Onset   Dementia Mother    Stroke Mother    Dementia Brother    Dementia Brother    Arthritis Sister    Cancer Other        nephew   Heart disease Father    Colon cancer Neg Hx     Social History Social History   Tobacco Use   Smoking status: Never Smoker   Smokeless tobacco: Never Used  Substance Use Topics   Alcohol use: No    Alcohol/week: 0.0 standard drinks   Drug use: No     Allergies   Simvastatin   Review of Systems Review of Systems Unable to review systems secondary to dementia Physical Exam Updated Vital Signs BP 134/83 (BP Location: Right Arm)    Pulse (!) 55    Temp 98.9 F (37.2 C) (Oral)    Resp 17    Ht 5\' 5"  (1.651 m)    Wt 101 kg    SpO2 96%    BMI 37.05 kg/m   Physical Exam Physical Exam  Nursing note and vitals reviewed. Constitutional: Sweet, pleasantly demented elderly  female. she appears well-developed and well-nourished. No distress.  HENT:  Head: Normocephalic and atraumatic.  Eyes: Conjunctivae normal and EOM are normal. Pupils are equal, round, and reactive to light. No scleral icterus.  Neck: Normal range of motion.  Cardiovascular: Normal rate, regular rhythm and normal heart sounds.  Exam reveals no gallop and no friction rub.   No murmur heard. Pulmonary/Chest: Effort normal and breath sounds normal. No respiratory distress.  Abdominal: Soft. Bowel sounds are normal. She exhibits no distension and no mass. There is no tenderness. There is no guarding.  Skill skeletal: Left knee with significant bruising,  mild swelling.  No sniffing and pain with passive range of motion.  Ligaments stable to confrontation. Neurological: She is alert.  Skin: Skin is warm and dry. She is not diaphoretic.     ED Treatments / Results  Labs (all labs ordered are listed, but only abnormal results are displayed) Labs Reviewed - No data to display  EKG None  Radiology Ct Head Wo Contrast  Result Date: 03/20/2019 CLINICAL DATA:  85 year old female status post fall from wheelchair. EXAM: CT HEAD WITHOUT CONTRAST CT CERVICAL SPINE WITHOUT CONTRAST TECHNIQUE: Multidetector CT imaging of the head and cervical spine was performed following the standard protocol without intravenous contrast. Multiplanar CT image reconstructions of the cervical spine were also generated. COMPARISON:  Head CTs 11/12/2011 and earlier. FINDINGS: CT HEAD FINDINGS Brain: Cerebral volume loss since 2012 appears fairly generalized, but anterior temporal lobe atrophy is severe a especially on the left. There is also chronic asymmetric left parietal volume loss. Increased heterogeneous hypodensity in the deep gray matter nuclei, a especially on the left. Patchy bilateral white matter hypodensity has also increased. No midline shift, ventriculomegaly, mass effect, evidence of mass lesion, intracranial  hemorrhage or evidence of cortically based acute infarction. Vascular: Calcified atherosclerosis at the skull base. Intracranial artery ectasia. No suspicious intracranial vascular hyperdensity. Skull: Stable and intact. Sinuses/Orbits: Trace fluid level in the right sphenoid with otherwise clear paranasal sinuses. Tympanic cavities and mastoids are clear. Other: No scalp hematoma. Negative orbits. CT CERVICAL SPINE FINDINGS Alignment: S-shaped cervicothoracic scoliosis with straightening and mild reversal of cervical lordosis. Cervicothoracic junction alignment is within normal limits. Bilateral posterior element alignment is within normal limits. Skull base and vertebrae: Visualized skull base is intact. No atlanto-occipital dissociation. No acute osseous abnormality identified. Soft tissues and spinal canal: No prevertebral fluid or swelling. No visible canal hematoma. Negative noncontrast neck soft tissues. Disc levels: Advanced partially calcified ligamentous degeneration about the odontoid, and also the C7 and T1 spinous processes. Mid and lower cervical disc and endplate degeneration. No definite cervical spinal stenosis. Upper chest: Grossly intact visible upper thoracic levels. Calcified aortic atherosclerosis. Tortuous arch. Negative visible lung apices aside from mild scarring. IMPRESSION: 1. No acute traumatic injury identified in the head or cervical spine. 2. Progressed cerebral volume loss and small vessel disease since 2012. Advanced anterior temporal lobe atrophy, especially on the left. 3. Cervical spine scoliosis and degeneration. 4. Trace fluid level in the right sphenoid sinus is likely inflammatory and inconsequential. Electronically Signed   By: Odessa Fleming M.D.   On: 03/20/2019 20:33   Ct Cervical Spine Wo Contrast  Result Date: 03/20/2019 CLINICAL DATA:  85 year old female status post fall from wheelchair. EXAM: CT HEAD WITHOUT CONTRAST CT CERVICAL SPINE WITHOUT CONTRAST TECHNIQUE:  Multidetector CT imaging of the head and cervical spine was performed following the standard protocol without intravenous contrast. Multiplanar CT image reconstructions of the cervical spine were also generated. COMPARISON:  Head CTs 11/12/2011 and earlier. FINDINGS: CT HEAD FINDINGS Brain: Cerebral volume loss since 2012 appears fairly generalized, but anterior temporal lobe atrophy is severe a especially on the left. There is also chronic asymmetric left parietal volume loss. Increased heterogeneous hypodensity in the deep gray matter nuclei, a especially on the left. Patchy bilateral white matter hypodensity has also increased. No midline shift, ventriculomegaly, mass effect, evidence of mass lesion, intracranial hemorrhage or evidence of cortically based acute infarction. Vascular: Calcified atherosclerosis at the skull base. Intracranial artery ectasia. No suspicious intracranial vascular hyperdensity. Skull: Stable and intact. Sinuses/Orbits:  Trace fluid level in the right sphenoid with otherwise clear paranasal sinuses. Tympanic cavities and mastoids are clear. Other: No scalp hematoma. Negative orbits. CT CERVICAL SPINE FINDINGS Alignment: S-shaped cervicothoracic scoliosis with straightening and mild reversal of cervical lordosis. Cervicothoracic junction alignment is within normal limits. Bilateral posterior element alignment is within normal limits. Skull base and vertebrae: Visualized skull base is intact. No atlanto-occipital dissociation. No acute osseous abnormality identified. Soft tissues and spinal canal: No prevertebral fluid or swelling. No visible canal hematoma. Negative noncontrast neck soft tissues. Disc levels: Advanced partially calcified ligamentous degeneration about the odontoid, and also the C7 and T1 spinous processes. Mid and lower cervical disc and endplate degeneration. No definite cervical spinal stenosis. Upper chest: Grossly intact visible upper thoracic levels. Calcified aortic  atherosclerosis. Tortuous arch. Negative visible lung apices aside from mild scarring. IMPRESSION: 1. No acute traumatic injury identified in the head or cervical spine. 2. Progressed cerebral volume loss and small vessel disease since 2012. Advanced anterior temporal lobe atrophy, especially on the left. 3. Cervical spine scoliosis and degeneration. 4. Trace fluid level in the right sphenoid sinus is likely inflammatory and inconsequential. Electronically Signed   By: Odessa Fleming M.D.   On: 03/20/2019 20:33   Dg Knee Complete 4 Views Left  Result Date: 03/20/2019 CLINICAL DATA:  Fall. EXAM: LEFT KNEE - COMPLETE 4+ VIEW COMPARISON:  12/02/2008 FINDINGS: Healed proximal fibular shaft fracture. No acute fracture or dislocation. No knee joint effusion. Joint space narrowing involves the patellofemoral articulation. Vascular calcifications. IMPRESSION: No acute osseous abnormality. Electronically Signed   By: Jeronimo Greaves M.D.   On: 03/20/2019 20:52    Procedures Procedures (including critical care time)  Medications Ordered in ED Medications - No data to display   Initial Impression / Assessment and Plan / ED Course  I have reviewed the triage vital signs and the nursing notes.  Pertinent labs & imaging results that were available during my care of the patient were reviewed by me and considered in my medical decision making (see chart for details).       Margaret Hicks female here after mechanical fall.  Left knee is without any evidence of fracture.  She uses a wheelchair at home.  CT C-spine and CT head are without any significant abnormalities.  I have reviewed the images personally and agree with radiologic interpretation.  Patient appears appropriate for discharge at this time Final Clinical Impressions(s) / ED Diagnoses   Final diagnoses:  Fall, initial encounter  Bruising    ED Discharge Orders    None       Arthor Captain, PA-C 03/20/19 2118    Arby Barrette, MD 03/21/19 1728

## 2019-03-21 ENCOUNTER — Telehealth: Payer: Self-pay

## 2019-03-21 DIAGNOSIS — R062 Wheezing: Secondary | ICD-10-CM

## 2019-03-21 DIAGNOSIS — R29898 Other symptoms and signs involving the musculoskeletal system: Secondary | ICD-10-CM | POA: Diagnosis not present

## 2019-03-21 DIAGNOSIS — S79911A Unspecified injury of right hip, initial encounter: Secondary | ICD-10-CM | POA: Diagnosis not present

## 2019-03-21 DIAGNOSIS — Z743 Need for continuous supervision: Secondary | ICD-10-CM | POA: Diagnosis not present

## 2019-03-21 DIAGNOSIS — Z7409 Other reduced mobility: Secondary | ICD-10-CM

## 2019-03-21 DIAGNOSIS — S79912A Unspecified injury of left hip, initial encounter: Secondary | ICD-10-CM | POA: Diagnosis not present

## 2019-03-21 DIAGNOSIS — Z9181 History of falling: Secondary | ICD-10-CM | POA: Insufficient documentation

## 2019-03-21 DIAGNOSIS — F0390 Unspecified dementia without behavioral disturbance: Secondary | ICD-10-CM

## 2019-03-21 DIAGNOSIS — R279 Unspecified lack of coordination: Secondary | ICD-10-CM | POA: Diagnosis not present

## 2019-03-21 MED ORDER — ALBUTEROL SULFATE (2.5 MG/3ML) 0.083% IN NEBU: 2.5000 mg | INHALATION_SOLUTION | RESPIRATORY_TRACT | 5 refills | Status: DC | PRN

## 2019-03-21 NOTE — Telephone Encounter (Signed)
I rev ED notes Thanks

## 2019-03-21 NOTE — ED Notes (Signed)
Patient returned from xray.

## 2019-03-21 NOTE — Telephone Encounter (Signed)
[facility] Night - Client TELEPHONE ADVICE RECORD [facility] Patient Name: Margaret PealLVA Daris Gender: Female DOB: 1932-02-06 Age: [age] Y 1 M 14 D Return Phone Number: [phone] (Primary), [phone] (Secondary) Address: [place] [id number] Client [facility] Client Client [facility] - [facility] Physician Tower, Idamae SchullerMarne - MD Contact Type Call Who Is Calling Patient / Member / Family / Caregiver Call Type Triage / Clinical Caller Name Lawson Radaraula Joyce Relationship To Patient Other relative Return Phone Number [phone] (Primary) Chief Complaint CONFUSION - new onset Reason for Call Symptomatic / Request for Health Information Initial Comment Caller states mother in law has fallen she is in a nursing home. She has dementia. What should she do? GOTO Facility Not Listed [facility] Translation No Nurse Assessment Nurse: Kyla Balzarinearmichael, RN, Fayrene FearingJames Date/Time (Eastern Time): 03/20/2019 5:49:50 PM Confirm and document reason for call. If symptomatic, describe symptoms. ---Caller states pt is morbidly obese, fell at nursing home. Dementia is a 8/10. States she is in a lot of pain. States c/o leg pain and are going to prop it up and and ice it. States if she takes her hospital- she will have to be quarantined for 2 weeks which she doesn't think will be good for the pt. States nurses are leaving it up to her whether or not take pt to ED. Mandy med tech at nursing home states, fell over night. Started crying and pointing to her leg. States her L knee is very swollen, blue. States appears to be a hematoma. Has the patient had close contact with a person known or suspected to have the novel coronavirus illness OR traveled / lives in area with major community spread (including international travel) in the last 14 days from the onset of symptoms? * If Asymptomatic, screen for exposure and  travel within the last 14 days. ---No Does the patient have any new or worsening symptoms? ---Yes Will a triage be completed? ---Yes Related visit to physician within the last 2 weeks? ---N/A Does the PT have any chronic conditions? (i.e. diabetes, asthma, this includes High risk factors for pregnancy, etc.) ---Yes List chronic conditions. ---Dementia, thyroid problem, hyperlipidemia, HTN, COPD Is this a behavioral health or substance abuse call? ---No PLEASE NOTE: All timestamps contained within this report are represented as [place] Standard Time. CONFIDENTIALTY NOTICE: This fax transmission is intended only for the addressee. It contains information that is legally privileged, confidential or otherwise protected from use or disclosure. If you are not the intended recipient, you are strictly prohibited from reviewing, disclosing, copying using or disseminating any of this information or taking any action in reliance on or regarding this information. If you have received this fax in error, please notify us immediately by telephone so that we can arrange for its return to us. Phone: [phone], Toll-Free: [phone], Fax: [phone] Page: 2 of 2 Call Id: [id number] Guidelines Guideline Title Affirmed Question Affirmed Notes Nurse Date/Time Lam[facility] Cohen(Eastern Time) Leg Injury Can't stand (bear weight) or walk Ralene CorkCarmichael, RN, James 03/20/2019 5:56:47 PM Disp. Time Lamount Cohen(Eastern Time) Disposition Final User 03/20/2019 5:46:44 PM Send to Urgent Pixie CasinoQueue Jowers, April 03/20/2019 6:01:33 PM 911 Outcome Documentation Kyla Balzarinearmichael, RN, Fayrene FearingJames Reason: Med tech at nursing home calling EMS for pt to go to [facility] per pt family member request and not [facility]. 03/20/2019 5:57:27 PM Call EMS 911 Now Yes Kyla Balzarinearmichael, RN, Cannon KettleJames Caller Disagree/Comply Comply Caller Understands Yes PreDisposition InappropriateToAsk Care Advice Given Per  Guideline CALL EMS 911 NOW: * Immediate medical attention is needed. You  need to hang up and call 911 (or an ambulance). * Triager Discretion: I'll call you back in a few minutes to be sure you were able to reach them. CARE ADVICE given per Leg Injury (Adult) guideline. Comments User: Madelon Lips, RN Date/Time Lamount Cohen Time): 03/20/2019 5:52:16 PM Called nursing home to speak w/ nurses taking care of pt for assessment/triage. User: Madelon Lips, RN Date/Time Lamount Cohen Time): 03/20/2019 6:00:33 PM Called family member back to let them know that pt will be transported by EMS to nearest hospital. Referrals GO TO FACILITY OTHER - SPECIFY

## 2019-03-21 NOTE — ED Notes (Signed)
Patient ambulated with assistance a short distance.

## 2019-03-21 NOTE — Telephone Encounter (Signed)
[name] (DPR signed) pt is using nebulizer up to 3 x a day and [facility] request larger qty of neb solution to [facility] [place]. Pt fell at [facility] 03/20/19 and pt was taken to [facility] ED by ambulance. Pt was evaluated with no fx and sent back to [facility] by ambulance; Tedd Sias advised [name] that they have misplaced the pulse ox and is not sure if has an extra w/c available. [name] wants to know if Dr Milinda Antis will write a rx for pulse ox and does Dr Milinda Antis think pt may need w/c now since she is more of a fall risk. Tedd Sias also advised [name] that pt is having h/as now. [name] wanted Dr Milinda Antis to know that she was very upset with "the disgusting treatment at [facility] ED she had ever seen." pt went from [facility] home to [facility] ED by ambulance. [name] went to [facility] ED and advised ED personnel that pt was a demensia pt and they would not let [name] back with pt. [name] was not given any paperwork or any information about pt. [name] said the only person that gave her any info was the lady with the admission cart. [name] was thankful there was no fx and pt as returned to [facility] house. [name] wanted Dr Milinda Antis to give her a name to call at [facility] about what happened last night at [facility] ED. I apologized to pt for the bad experience at ED and I gave her the office of pt experience at [facility] [phone]. [name] voiced understanding and was appreciative and request cb about the request made earlier in note.

## 2019-03-21 NOTE — ED Provider Notes (Signed)
Patient with reported fall from standing.  Complaining of left knee pain.  Imaging negative.  Family member requests hip x-rays be added.  Hip films are negative.  No pain with PROM. Ambulates with assistance.   Roxy Horseman, PA-C 03/21/19 0248    Ward, Layla Maw, DO 03/21/19 8563

## 2019-03-21 NOTE — Telephone Encounter (Signed)
I have orders to pick up or fax for DME (pulse ox machine and wheelchair)  Unsure if ins will cover or not They are in [place] box by her desk   Sent albuterol   Keep me updated re: headache or other symptoms   I will message Campbell Lerner in office re: location of orders  Thanks

## 2019-03-21 NOTE — Telephone Encounter (Signed)
Per chart review tab pt went to [facility] on 03/20/19.

## 2019-03-22 ENCOUNTER — Telehealth: Payer: Self-pay

## 2019-03-22 MED ORDER — CEPHALEXIN 500 MG PO CAPS: 500.0000 mg | ORAL_CAPSULE | Freq: Three times a day (TID) | ORAL | 0 refills | Status: DC

## 2019-03-22 NOTE — Telephone Encounter (Signed)
Spoke with patient's caregiver, Cornelius Moras, at [facility].    Patient has developed new onset redness and warmth from left knee down to above ankle/ foot area. Caregiver states that she does not think there is additional swelling from patient's baseline.    Pulses are present per caregiver and she does not seem to be in any additional pain.    This is the leg that she fell and injured her knee 2 nights ago and went to the ER with negative xrays.    Will forward to Dr. Milinda Antis for further evaluation.  Phone image of left lower leg area only shared by caregiver with triage nurse and PCP here for visualization due to COVID situation in community and inability to bring patient in office due to safety needs.

## 2019-03-22 NOTE — Telephone Encounter (Signed)
Spoke with patient's daughter, [name], and informed of the situation with her mother.  She wants [place] [facility] [place] and she will go this pm to pick up and take to facility for patient to start tonight. Pended r/x sent in per Dr. Royden Purl orders.   Also called and spoke with nurse, Andrey Campanile, and informed of all instructions.  They are to call back if no improvement or if worsens.  Both, daughter and nurse verbalize understanding of instructions.

## 2019-03-22 NOTE — Telephone Encounter (Signed)
I pended px for keflex to send to preferred pharmacy to treat for cellulitis I saw pic on cell phone-redness over L lower leg with well demarcated borders Keep clean with soap and water  Elevate when able (I know pt is not cooperative with this)  Alert [place] if worse or no imp   Please send abx to [facility] pharmacy Thanks

## 2019-03-25 ENCOUNTER — Telehealth: Payer: Self-pay

## 2019-03-25 NOTE — Telephone Encounter (Signed)
Patients daughter, [name], calls back to the office saying she missed a call on Friday evening.  I did reach out initially and was unable to reach [name] on Friday pm to inform of her mother's cellulitis and prescribed tx.  I called back around 5pm and was about to reach her and gave instructions.  No further calls from the office were made thereafter, unless Dr. Milinda Antis tried to reach patient.   I believe this has been resolved, but will forward to Dr. Milinda Antis as an FYI just in case.   Client [name] Night - Client Client Site [facility] - Night Physician Tower, Idamae Schuller - MD Contact Type Call Who Is Calling Patient / Member / Family / Caregiver Caller Name Margaret Hicks Caller Phone Number [phone] Call Type Message Only Information Provided Reason for Call Returning a Call from the Office Initial Comment Caller states missed call from office. Additional Comment Margaret Hicks 1932/09/19 Call Closed By: Lawrence Marseilles Transaction Date/Time: 03/22/2019 5:45:21 PM (ET)

## 2019-03-26 MED ORDER — DOXYCYCLINE HYCLATE 100 MG PO TABS: 100.0000 mg | ORAL_TABLET | Freq: Two times a day (BID) | ORAL | 0 refills | Status: DC

## 2019-03-26 NOTE — Addendum Note (Signed)
Addended by: Roxy Manns A on: 03/26/2019 12:36 PM   Modules accepted: Orders

## 2019-03-26 NOTE — Telephone Encounter (Signed)
Let's try doxycycline (I sent it to the pharmacy) -please call daughter [name]  Please watch her carefully  Elevate leg when she will let you  Watch for drainage or worse erythema or streaks  Also temp over 100.5 or shortness of breath  In regards to the mouth- ?does she have any dental disease evident?    How is she feeling in general?  How is her appetite? Does she seem to be in pain?  I do not want to have her in the office unless absolutely necessary but if no improvement we may have to do an in office visit   Please keep me posted

## 2019-03-26 NOTE — Telephone Encounter (Signed)
[name] nurse from [facility] left v/m; cellulitis is not improving; pt taking keflex; more bruising, area red and warm to touch;low grade fever this AM 98.9 axillary. [name] gave pt Tylenol. Pt also having pain in side of face when chews.[name] request cb with what to do.

## 2019-03-26 NOTE — Telephone Encounter (Signed)
Spoken to both [employer] [employer] therapist) and patient's daughter [name])  Jovita Gamma the instructions to [place]. She stated the area is warm to the touch. Patient's temp earlier today was 99.2 There is no swelling at the mouth area. Patient only have pain when she chews food. She is sleeping a lot and not eating much due the pain when she chews. Andrey Campanile stated that they are keep an eye on patient.  Gave the same information to [place]. She stated that is very concern on what is going with patient. She is requesting if Dr Milinda Antis can give her a call. She is concern this problem started with the use of the inhaler.

## 2019-03-26 NOTE — Telephone Encounter (Signed)
I spoke to her We discussed her situation and unfortunately we cannot see her in the office/nor can her daughter get into her assisted living to see her due to the pandemic She will investigate other options for visit/virtual and let us know  Had some concerns about albuterol making her agitated (more likely dementia)  We are trying a 2nd abx for presumed cellulitis-if no improvement she may need eval in ED I will also work on the wheelchair order  She will stay in close contact  There may be some interest in moving her to a different facility once the pandemic allows it -unsure for now

## 2019-04-02 ENCOUNTER — Telehealth: Payer: Self-pay

## 2019-04-02 NOTE — Telephone Encounter (Signed)
Patient's daughter-in-law Eber Jones (on [place]) called stating that they got a call stating that patient may have been exposed to Covid 19 while she in the the ER on 03/20/19. Eber Jones stated that patient is in a nursing facility and does not feel safe taking her out to be tested. Eber Jones stated that this happened on 03/20/19 and wants to know what Dr. Milinda Antis thinks about taking her out to be tested.  Eber Jones stated that if they take her out she feels that she will have to be quarantined longer.   Eber Jones stated at this point patient is doing fine and no Covid-19 symptoms and wants Dr. Royden Purl opinion and advice.  Eber Jones [phone]

## 2019-04-02 NOTE — Telephone Encounter (Signed)
Called the # left in the message for Eber Jones and it was off so called other daughter on [employer] Lawson Radar and notified her of Dr. Royden Purl comments and recommendations and verbalized understanding

## 2019-04-02 NOTE — Telephone Encounter (Signed)
Patient called on number listed and her daughter-in-law Lawson Radar answered, she's on the [facility]. [name] was advised while the patient was in the hospital, she may have been exposed to an employee who later tested positive for COVID-19 and that we are offering the testing to be done, if she agrees. She says the patient is in a nursing home since discharge and has been in isolation for 2 weeks and if she comes out, the isolation will start all over again. She says she will discuss with her PCP and go from there. I advised if she recommends testing to call back to [phone] Monday through Friday from 0700-1900, she verbalized understanding.  [name] asked who does she need to talk to about the patient's hospital stay and not being notified about her care. I advised [facility] [phone].

## 2019-04-02 NOTE — Telephone Encounter (Signed)
If she has been in isolation for 2 or more weeks and no symptoms I do not think testing is necessary.  Keep me posted if she has any symptoms such as cough/fever/sob

## 2019-04-10 ENCOUNTER — Telehealth: Payer: Self-pay | Admitting: *Deleted

## 2019-04-10 NOTE — Telephone Encounter (Signed)
?   Is that something they sent that I filled out?

## 2019-04-10 NOTE — Telephone Encounter (Signed)
[name] at [facility] left a voicemail stating that the patient had fallen and was taken to the ER some time ago. Andrey Campanile stated that patient was treated for cellulitis and her  leg is doing much better. Andrey Campanile stated that patient has finished all of her antibiotics. Andrey Campanile stated  that she needs a copy of the [facility] orders with signature for their records because they have nothing on file. Andrey Campanile stated that they did give the patient all of the antibiotic medications that were prescribed.  Andrey Campanile is requesting copies of orders.

## 2019-04-11 NOTE — Telephone Encounter (Signed)
There are some forms in Media from them in regards to her leg and Tylenol usage. Could that be what they are asking for?

## 2019-04-11 NOTE — Telephone Encounter (Signed)
They want the antibiotic order- I gave it verbally but don't remember signing paperwork about it.   Please ask them to send what they need me to sign

## 2019-04-12 ENCOUNTER — Telehealth: Payer: Self-pay | Admitting: *Deleted

## 2019-04-12 NOTE — Telephone Encounter (Signed)
[name] called stating that her mom is at [facility] and they feel that she may be getting dehydrated. [name] requested that we call and speak to the nurse at [facility] which is [place]. Called and spoke to [place] and was advised that patient is not eating or drinking much at all. Andrey Campanile stated that she has had a look on her face as if she were having facial pain since returning from the hospital with a fall weeks ago. Andrey Campanile stated that they have been pureeing her food, but she is still not eating or drinking much at all and may be getting dehydrated. Andrey Campanile stated that she is usually a heavy wetter, but not putting out much urine at this time. [name] requested recommendations from Dr. Milinda Antis. [name]'s phone number is [phone]

## 2019-04-12 NOTE — Telephone Encounter (Signed)
Left VM requesting [name] to call the office back

## 2019-04-12 NOTE — Telephone Encounter (Signed)
If she is not drinking because she has swallowing problems please alert me  Dementia can cause decrease in appetite and oral intake  Do they have ability to give IVF there (I would not think so)  Do they think she needs IVF in the ER (daughter will be very apprehensive to have her go to the hospital)  Oral rehydration would be best if they can get her to drink -perhaps there is a beverage she really likes they can tempt her with

## 2019-04-12 NOTE — Telephone Encounter (Signed)
Left VM requesting pt to call the office back 

## 2019-04-12 NOTE — Telephone Encounter (Signed)
Spoke with [place] and she said she doesn't think pt has a swallowing issue she thinks it's a chewing issue. Every time pt tries to bite or chew she grimaces. Andrey Campanile said they have checked all in her mouth and they don't see anything that would cause it so she is questioning if it's a neurologic issue like TMJ or something like that. Andrey Campanile did say that even though she has had a decrease of urine output the family bought her some ensures and gatorade so they are trying to keep her hydrated at the facility due to family not wanting her to go back to the ER unless absolutely necessary. She is drinking the ensures and gatorade so they think she will be okay. Andrey Campanile question of there is something you can recommend or prescribe that might help with the jaw pain that might get her to eat better

## 2019-04-12 NOTE — Telephone Encounter (Signed)
Her daughter had some reservations about giving pt pain medicine (other than tylenol)-please check in with her  I would eventually like to get her to an ENT specialist to see what is going on with the mouth/jaw when she can more safely go out

## 2019-04-12 NOTE — Telephone Encounter (Signed)
Spoke with Andrey Campanile she said she will fax an order next week that Dr. Milinda Antis can just sign and send back that gives them permission to administer the abx. Andrey Campanile said they got the verbal order when she was 1st given the abx but they should have faxed over a written order then to keep in her chart, but the will fax over the order next week

## 2019-04-16 NOTE — Telephone Encounter (Signed)
Margaret Hicks returned your call, she has her phone on her

## 2019-04-16 NOTE — Telephone Encounter (Signed)
Left VM requesting [name] to call the office back

## 2019-04-17 NOTE — Telephone Encounter (Signed)
I spike with her daughter and she is concerned about both the leg infection and the mouth/? Jaw pain with eating  We discussed options for eval She will call and see if they have a staff nsg home doctor who rounds there to see her  If not- she will probably have to take her out for a visit here (then plan to follow)  She may need a dentist or ENT For now -using tylenol for pain and continues abx for leg skin infection   She will update me tomorrow

## 2019-04-17 NOTE — Telephone Encounter (Signed)
[name] returned your call best number [phone]

## 2019-04-17 NOTE — Telephone Encounter (Signed)
Left VM requesting pt's daughter [name] to call the office back

## 2019-04-17 NOTE — Telephone Encounter (Signed)
Daughter request Dr. Milinda Antis to call her back directly [phone]  She just doesn't know what to do and wants to talk to PCP, she is unsure about starting pain meds and has some reservations she would like to discuss directly with PCP.

## 2019-04-18 NOTE — Telephone Encounter (Signed)
Paula,patient's daughter,called.  [name] spoke to the director of the nursing home,Tracy.  Please call patient [name] back and she'll let you know what French Ana said.  Paula's number is [phone].

## 2019-04-19 NOTE — Telephone Encounter (Signed)
Lawson Radar notified of Dr. Royden Purl comments and appt scheduled for Monday 04/22/19 at 12:00pm (last appt of the morning) she will come to the back near [place]'s office and call when she is outside so we can bring a wheelchair to her. Daughter will call French Ana back and let her know appt info

## 2019-04-19 NOTE — Telephone Encounter (Signed)
I spoke to her -please set her up for an appt next week (if at all possible-I would like 45 minutes)  She will need to come straight back to the exam room with a wheelchair (cannot stay in waiting room) due to her nsg home protocol and wear a mask   Please call daughter to schedule and tell her daughter to call French Ana at [facility] with the details (I just spoke to [place] to get permission to get her out of the home)   Thanks !

## 2019-04-22 ENCOUNTER — Encounter: Payer: Self-pay | Admitting: Family Medicine

## 2019-04-22 ENCOUNTER — Other Ambulatory Visit: Payer: Self-pay

## 2019-04-22 ENCOUNTER — Ambulatory Visit (INDEPENDENT_AMBULATORY_CARE_PROVIDER_SITE_OTHER): Payer: Medicare Other | Admitting: Family Medicine

## 2019-04-22 VITALS — BP 126/70 | HR 56 | Temp 97.4°F

## 2019-04-22 DIAGNOSIS — I10 Essential (primary) hypertension: Secondary | ICD-10-CM

## 2019-04-22 DIAGNOSIS — R600 Localized edema: Secondary | ICD-10-CM

## 2019-04-22 DIAGNOSIS — R062 Wheezing: Secondary | ICD-10-CM | POA: Diagnosis not present

## 2019-04-22 DIAGNOSIS — S8012XA Contusion of left lower leg, initial encounter: Secondary | ICD-10-CM | POA: Diagnosis not present

## 2019-04-22 DIAGNOSIS — R1310 Dysphagia, unspecified: Secondary | ICD-10-CM | POA: Diagnosis not present

## 2019-04-22 DIAGNOSIS — L602 Onychogryphosis: Secondary | ICD-10-CM | POA: Diagnosis not present

## 2019-04-22 DIAGNOSIS — Z7409 Other reduced mobility: Secondary | ICD-10-CM

## 2019-04-22 DIAGNOSIS — F3289 Other specified depressive episodes: Secondary | ICD-10-CM | POA: Diagnosis not present

## 2019-04-22 DIAGNOSIS — N289 Disorder of kidney and ureter, unspecified: Secondary | ICD-10-CM | POA: Diagnosis not present

## 2019-04-22 DIAGNOSIS — F0390 Unspecified dementia without behavioral disturbance: Secondary | ICD-10-CM | POA: Diagnosis not present

## 2019-04-22 DIAGNOSIS — E039 Hypothyroidism, unspecified: Secondary | ICD-10-CM

## 2019-04-22 NOTE — Patient Instructions (Addendum)
Keep elevating legs  Tylenol for pain  There is a hematoma on the left lower leg that may worsen her swelling - I think that will gradually get better Keep watching for the redness/warmth or any pain  Soft foods are fine  Observe her eating to see what happens with swallowing (if her esophageal stricture is back that can be addressed) If mouth hurts -then a small amount of salt water or peroxide and water rinse is ok  Also have staff comment on top of mouth after plate is out  Vital signs look good Lungs sound clear She is due to have toe nails filed/ground down   Pulse ox is 96% on room air - excellent   Let me know how it goes

## 2019-04-22 NOTE — Assessment & Plan Note (Signed)
bp in fair control at this time  BP Readings from Last 1 Encounters:  04/22/19 126/70   No changes needed Most recent labs reviewed  Disc lifstyle change with low sodium diet and exercise  Continues losartan and metoprolol

## 2019-04-22 NOTE — Assessment & Plan Note (Signed)
No big changes in weight per recent wt in ED Appetite ok-but some? Chewing or swallowing problems Wt loss would help mobility and edema but unsure if realistic at her age and with dementia

## 2019-04-22 NOTE — Assessment & Plan Note (Signed)
Slowly progressive with some reports of agitation and non cooperation with bathing/walking at times Also eating less  Daughter gives hx today-pt is pleasant and content and agreeable with examination (though she refused to take the upper denture plate out of her mouth)

## 2019-04-22 NOTE — Assessment & Plan Note (Signed)
No wheezing on exam today  Pulse ox 96% on RA as well  Very re assuring

## 2019-04-22 NOTE — Assessment & Plan Note (Signed)
Lab Results  Component Value Date   TSH 3.30 12/25/2018   No clinical changes re: fatigue or weight

## 2019-04-22 NOTE — Assessment & Plan Note (Signed)
In a wheelchair today Requires help for ambulation With dementia-sometimes refuses to walk

## 2019-04-22 NOTE — Assessment & Plan Note (Signed)
Difficult to tell if changes given her dementia She is content and happy today/smiling  Reports some agitation at her residence  Continues sertraline w/o change

## 2019-04-22 NOTE — Assessment & Plan Note (Signed)
Overdue for nail trim (grinding)  If unable to have the podiatrist visit [facility] we can ref for pod visit

## 2019-04-22 NOTE — Progress Notes (Signed)
Subjective:    Patient ID: Margaret Hicks, female    DOB: 1932-01-10, 85 y.o.   MRN: [id number]  HPI Pt here for f/u of chronic medical problems  Also jaw or mouth pain /interferes with eating - or swallowing issues (unsure) She grimaces to swallow   Also leg cellulitis-we have been treating outpatient   She lives at [facility] currently and family has not been able to see her  Some dementia  occ agitated for bathing/or uncooperative   Does hurt L hip -complains a bit   Not wanting to eat or eating much due to mouth pain (may be jaw pain)-unsure   Wt Readings from Last 3 Encounters:  03/20/19 222 lb 10.6 oz (101 kg)  12/25/18 224 lb 8 oz (101.8 kg)  04/10/18 220 lb 12 oz (100.1 kg)    could not stand to weigh    Ear hurt when she had ear temp taken today  Mouth/jaw pain is on the right (possibly) Now ? Of whether it is actually a swallowing problem  Taking tylenol   HTN BP Readings from Last 3 Encounters:  04/22/19 126/70  03/21/19 (!) 153/86  12/25/18 132/80   Pulse Readings from Last 3 Encounters:  04/22/19 (!) 56  03/21/19 62  12/25/18 (!) 53   Taking aldactone 12.5 mg  Metoprolol 50 mg bid Losartan 100 mg daily   Lab Results  Component Value Date   CREATININE 1.36 (H) 12/25/2018   BUN 30 (H) 12/25/2018   NA 138 12/25/2018   K 4.4 12/25/2018   CL 101 12/25/2018   CO2 31 12/25/2018   Has seen Dr Cherylann RatelLateef for renal insufficiency  Struggle to get her to drink fluids   Lab Results  Component Value Date   ALT 16 12/25/2018   AST 16 12/25/2018   ALKPHOS 108 12/25/2018   BILITOT 0.6 12/25/2018   Lab Results  Component Value Date   WBC 5.0 12/25/2018   HGB 13.5 12/25/2018   HCT 40.1 12/25/2018   MCV 85.3 12/25/2018   PLT 194.0 12/25/2018   Hypothyroidism  Pt has no clinical changes No change in energy level/ hair or skin/ edema and no tremor Lab Results  Component Value Date   TSH 3.30 12/25/2018     Dementia Gradually progressive Likes  to sing    Mood/depression Taking sertraline 50 mg daily   Pedal edema with redness  Cholesterol  Lab Results  Component Value Date   CHOL 201 (H) 12/25/2018   HDL 45.90 12/25/2018   LDLCALC 117 (H) 12/25/2018   LDLDIRECT 126.0 09/16/2016   TRIG 191.0 (H) 12/25/2018   CHOLHDL 4 12/25/2018   Intolerant of statin  Diet is fair= at her age hesitant to change   Patient Active Problem List   Diagnosis Date Noted  . Painful swallowing 04/22/2019  . Traumatic hematoma of left lower leg 04/22/2019  . Hypertrophic toenail 04/22/2019  . H/O falling 03/21/2019  . Wheezing 03/15/2019  . Short of breath on exertion 12/25/2018  . Toenail deformity 12/25/2018  . Foot deformity, acquired 11/25/2016  . Morbid obesity (HCC) 09/26/2016  . Mobility impaired 09/17/2014  . Encounter for Medicare annual wellness exam 09/01/2014  . Internal hemorrhoids 08/27/2014  . Pedal edema 08/12/2013  . Dysphagia, unspecified(787.20) 05/29/2013  . Aneurysm, splenic artery (HCC) 05/29/2013  . Senile dementia, uncomplicated (HCC) 01/09/2013  . Back pain, thoracic 11/28/2012  . Depression 11/28/2012  . Other screening mammogram 11/28/2012  . EXTERNAL HEMORRHOIDS 01/26/2011  .  GERD 09/01/2010  . Hypothyroidism 05/27/2010  . Hyperlipidemia LDL goal <100 05/27/2010  . Essential hypertension 05/27/2010  . Mild renal insufficiency 05/27/2010  . Unspecified urinary incontinence 05/27/2010  . Personal history of urinary disorder 05/27/2010   Past Medical History:  Diagnosis Date  . Acute gastritis   . Arthritis   . Back pain   . Dementia (HCC)   . Esophageal reflux   . GERD (gastroesophageal reflux disease)   . Hemorrhoids   . Hyperlipemia   . Hypertension   . Hypothyroidism   . Obesity   . OP (osteoporosis)   . Renal insufficiency   . Schatzki's ring   . TIA (transient ischemic attack)   . Upper GI bleed   . Urinary incontinence    Past Surgical History:  Procedure Laterality Date  .  ABDOMINAL HYSTERECTOMY    . CARDIAC CATHETERIZATION    . CHOLECYSTECTOMY    . TONSILLECTOMY     Social History   Tobacco Use  . Smoking status: Never Smoker  . Smokeless tobacco: Never Used  Substance Use Topics  . Alcohol use: No    Alcohol/week: 0.0 standard drinks  . Drug use: No   Family History  Problem Relation Age of Onset  . Dementia Mother   . Stroke Mother   . Dementia Brother   . Dementia Brother   . Arthritis Sister   . Cancer Other        nephew  . Heart disease Father   . Colon cancer Neg Hx    Allergies  Allergen Reactions  . Simvastatin     REACTION: muscle pain/ memory issues   Current Outpatient Medications on File Prior to Visit  Medication Sig Dispense Refill  . acetaminophen (TYLENOL) 500 MG tablet Take 500 mg by mouth every 8 (eight) hours as needed for moderate pain or headache.    . albuterol (PROVENTIL) (2.5 MG/3ML) 0.083% nebulizer solution Take 3 mLs (2.5 mg total) by nebulization every 4 (four) hours as needed for up to 90 doses for wheezing or shortness of breath. Diagnosis wheezing R06.2 270 mL 5  . Alum & Mag Hydroxide-Simeth (GERI-LANTA PO) Take 30 mLs by mouth 4 (four) times daily as needed.    . bismuth subsalicylate (PEPTO BISMOL) 262 MG/15ML suspension Take 10 mLs by mouth every 6 (six) hours as needed.    . cholecalciferol (VITAMIN D) 1000 UNITS tablet Take 1,000 Units by mouth every evening.     Marland Kitchen Dextromethorphan HBr (ROBAFEN COUGH PO) Take by mouth.    Marland Kitchen guaiFENesin (TUSSIN) 100 MG/5ML liquid Take 200 mg by mouth daily as needed for cough.    . lansoprazole (PREVACID) 15 MG capsule Take 1 capsule by mouth daily at 12PM. 30 capsule 11  . levothyroxine (SYNTHROID, LEVOTHROID) 100 MCG tablet Take 1 tablet (100 mcg total) by mouth daily before breakfast. 30 tablet 11  . loperamide (ANTI-DIARRHEAL) 2 MG capsule Take 2 mg by mouth as needed for diarrhea or loose stools.    Marland Kitchen losartan (COZAAR) 100 MG tablet Take 1 tablet (100 mg total) by  mouth daily. In am 30 tablet 5  . magnesium hydroxide (MILK OF MAGNESIA) 400 MG/5ML suspension Take 30 mLs by mouth daily as needed for mild constipation.    . metoprolol tartrate (LOPRESSOR) 100 MG tablet Take 0.5 tablets (50 mg total) by mouth 2 (two) times daily. Am and pm 30 tablet 11  . Multiple Vitamin (MULTIVITAMIN) capsule Take 1 capsule by mouth daily.      Marland Kitchen  Neomycin-Bacitracin-Polymyxin (TRIPLE ANTIBIOTIC EX) Apply 1 application topically daily as needed.    . nystatin (MYCOSTATIN/NYSTOP) powder Apply topically 4 (four) times daily. To affected areas 30 g 3  . sertraline (ZOLOFT) 50 MG tablet Take 1 tablet (50 mg total) by mouth daily. In pm 30 tablet 11  . spironolactone (ALDACTONE) 25 MG tablet Take 0.5 tablets (12.5 mg total) by mouth every morning. 15 tablet 11   No current facility-administered medications on file prior to visit.     Review of Systems  Constitutional: Negative for activity change, appetite change, chills, fatigue, fever and unexpected weight change.  HENT: Positive for trouble swallowing. Negative for congestion, ear pain, facial swelling, rhinorrhea, sinus pressure, sinus pain and sore throat.        ? If difficulty with swallowing or chewing  Eyes: Negative for pain, redness and visual disturbance.  Respiratory: Negative for apnea, cough, choking, chest tightness, shortness of breath, wheezing and stridor.        She gets NMT at her residence for sob prn    Cardiovascular: Positive for leg swelling. Negative for chest pain and palpitations.  Gastrointestinal: Negative for abdominal pain, blood in stool, constipation and diarrhea.  Endocrine: Negative for polydipsia and polyuria.  Genitourinary: Negative for dysuria, frequency and urgency.  Musculoskeletal: Negative for arthralgias, back pain and myalgias.  Skin: Negative for pallor and rash.       Redness of L lower leg with baseline edema  Also hematoma  Very long /thick overgrown toe nails   Allergic/Immunologic: Negative for environmental allergies.  Neurological: Negative for dizziness, syncope and headaches.  Hematological: Negative for adenopathy. Does not bruise/bleed easily.  Psychiatric/Behavioral: Positive for confusion. Negative for decreased concentration, dysphoric mood, self-injury and sleep disturbance. The patient is not nervous/anxious.        Dementia is progressive        Objective:   Physical Exam Constitutional:      General: She is not in acute distress.    Appearance: Normal appearance. She is well-developed. She is obese. She is not ill-appearing or diaphoretic.     Comments: In wheelchair   HENT:     Head: Normocephalic and atraumatic.     Comments: No TM joint, sinus or temple tenderness    Right Ear: Tympanic membrane and ear canal normal.     Left Ear: Tympanic membrane and ear canal normal.     Ears:     Comments: No pain at all on ear exam    Nose: Nose normal. No congestion or rhinorrhea.     Mouth/Throat:     Mouth: Mucous membranes are moist.     Pharynx: Oropharynx is clear. No oropharyngeal exudate or posterior oropharyngeal erythema.     Comments: Some gingivitis -lower teeth with some missing  No obvious sign of dental abscess or infection  No dental pain when tapped with a tongue blade No thrush Throat appears normal  Pt refused to take out dental plate for palate exam  Eyes:     General: No scleral icterus.       Right eye: No discharge.        Left eye: No discharge.     Extraocular Movements: Extraocular movements intact.     Conjunctiva/sclera: Conjunctivae normal.     Pupils: Pupils are equal, round, and reactive to light.  Neck:     Musculoskeletal: Normal range of motion and neck supple.     Thyroid: No thyromegaly.     Vascular: No  carotid bruit or JVD.  Cardiovascular:     Rate and Rhythm: Regular rhythm. Bradycardia present.     Heart sounds: Normal heart sounds. No gallop.   Pulmonary:     Effort: Pulmonary  effort is normal. No respiratory distress.     Breath sounds: Normal breath sounds. No wheezing or rales.  Abdominal:     General: Bowel sounds are normal. There is no distension or abdominal bruit.     Palpations: Abdomen is soft. There is no mass.     Tenderness: There is no abdominal tenderness.  Musculoskeletal:     Right lower leg: Edema present.     Left lower leg: Edema present.     Comments: Baseline lymphedema in lower legs   Lymphadenopathy:     Cervical: No cervical adenopathy.  Skin:    General: Skin is warm and dry.     Findings: Erythema present. No rash.     Comments: Erythema of L lower leg with some peeling skin  Hematoma noted under knee which is soft and nt   Toe nails are thickened/curled under (worse on R foot)  Neurological:     Mental Status: She is alert. Mental status is at baseline.     Deep Tendon Reflexes: Reflexes are normal and symmetric.     Comments: Baseline dementia Agreeable (mostly) and content  Does not answer questions No tremor  Mobility impaired   Psychiatric:        Mood and Affect: Mood normal. Mood is not anxious or depressed.        Speech: She is noncommunicative.        Behavior: Behavior is not agitated, slowed or withdrawn.        Cognition and Memory: Cognition is impaired. She exhibits impaired recent memory.     Comments: Dementia Content -smiles and laughs during visit            Assessment & Plan:   Problem List Items Addressed This Visit      Cardiovascular and Mediastinum   Essential hypertension    bp in fair control at this time  BP Readings from Last 1 Encounters:  04/22/19 126/70   No changes needed Most recent labs reviewed  Disc lifstyle change with low sodium diet and exercise  Continues losartan and metoprolol        Endocrine   Hypothyroidism    Lab Results  Component Value Date   TSH 3.30 12/25/2018   No clinical changes re: fatigue or weight        Nervous and Auditory   Senile  dementia, uncomplicated (HCC)    Slowly progressive with some reports of agitation and non cooperation with bathing/walking at times Also eating less  Daughter gives hx today-pt is pleasant and content and agreeable with examination (though she refused to take the upper denture plate out of her mouth)        Musculoskeletal and Integument   Hypertrophic toenail    Overdue for nail trim (grinding)  If unable to have the podiatrist [facility] we can ref for pod visit         Genitourinary   Mild renal insufficiency    Last Cr of 1.36 in ED /may have been dehydrated She drinks fluids well in the office  bp is controlled Will continue to follow         Other   Depression    Difficult to tell if changes given her dementia She is content  and happy today/smiling  Reports some agitation at her residence  Continues sertraline w/o change      Pedal edema    Baseline  Refuses supp hose  Also will not elevate feet when sitting  Recent redness/cellulitis on L seems to be resolving Will continue to monitor      Mobility impaired    In a wheelchair today Requires help for ambulation With dementia-sometimes refuses to walk      Morbid obesity (HCC)    No big changes in weight per recent wt in ED Appetite ok-but some? Chewing or swallowing problems Wt loss would help mobility and edema but unsure if realistic at her age and with dementia       Wheezing    No wheezing on exam today  Pulse ox 96% on RA as well  Very re assuring       Painful swallowing - Primary    Unsure if eating problem is due to mouth/jaw or swallowing problems  Nl orofacial exam today - but she refused to take out upper denture plate (asked staff at [facility] to check when she takes it out tonight)  No obvious dental problems  No TM joint pain or click  She drank water w/o problems today  Her daughter will get something for her to eat in the car and observe her swallow She has had  esoph stricture in the past so this could re occur       Traumatic hematoma of left lower leg    Pt had fall in April and ED visit reviewed  xrays of hip and knee are stable  On exam today moderate soft hematoma under knee that is nontender (no ecchymosis)  She has some redness of leg with baseline edema -this has improved with abx Refuses hose/compression  Expect slow improvement-will continue to obs at [facility]

## 2019-04-22 NOTE — Assessment & Plan Note (Signed)
Pt had fall in April and ED visit reviewed  xrays of hip and knee are stable  On exam today moderate soft hematoma under knee that is nontender (no ecchymosis)  She has some redness of leg with baseline edema -this has improved with abx Refuses hose/compression  Expect slow improvement-will continue to obs at [facility]

## 2019-04-22 NOTE — Assessment & Plan Note (Signed)
Baseline  Refuses supp hose  Also will not elevate feet when sitting  Recent redness/cellulitis on L seems to be resolving Will continue to monitor

## 2019-04-22 NOTE — Assessment & Plan Note (Signed)
Unsure if eating problem is due to mouth/jaw or swallowing problems  Nl orofacial exam today - but she refused to take out upper denture plate (asked staff at [facility] to check when she takes it out tonight)  No obvious dental problems  No TM joint pain or click  She drank water w/o problems today  Her daughter will get something for her to eat in the car and observe her swallow She has had esoph stricture in the past so this could re occur

## 2019-04-22 NOTE — Assessment & Plan Note (Signed)
Last Cr of 1.36 in ED /may have been dehydrated She drinks fluids well in the office  bp is controlled Will continue to follow

## 2019-05-23 ENCOUNTER — Telehealth: Payer: Self-pay

## 2019-05-23 NOTE — Telephone Encounter (Signed)
Margaret Hicks (DPR signed) left v/m requesting email to send a request for letter of medical necessity because medicare denied the ride to the hospital for pt when she fell.

## 2019-05-23 NOTE — Telephone Encounter (Signed)
Please clarify- was it by ambulance?  Let me know and I will do the letter

## 2019-05-27 NOTE — Telephone Encounter (Signed)
Note in IN box

## 2019-05-27 NOTE — Telephone Encounter (Signed)
Letter mailed pt daughter's request

## 2019-05-27 NOTE — Telephone Encounter (Signed)
When pt went to the ER on 03/20/19 after her fall at her assisted living, they had to call 911 and the ambulance took her, daughter is requesting a letter for that ambulance ride

## 2019-05-29 ENCOUNTER — Telehealth: Payer: Self-pay | Admitting: *Deleted

## 2019-05-29 NOTE — Telephone Encounter (Signed)
We received an order from [facility] (assisted living) requesting pt's diet be changed from regular to "grounded/ mechanical soft" diet.  Given last OV [name] wrote a note on the order asking me to check with pt's daughter Elson Areas 1st.  Dimitri Ped and advised her of the diet change request, daughter wasn't aware of this request and the assisted living had not reached out to her to discuss this at all. Daughter said she is going to call [facility] and talk with them before we send any orders to them. Form placed in my inbox until [place] calls back

## 2019-06-17 ENCOUNTER — Telehealth: Payer: Self-pay | Admitting: *Deleted

## 2019-06-17 MED ORDER — HYDROCORTISONE (PERIANAL) 2.5 % EX CREA: 1.0000 "application " | TOPICAL_CREAM | Freq: Two times a day (BID) | CUTANEOUS | 0 refills | Status: DC

## 2019-06-17 NOTE — Telephone Encounter (Signed)
[place] nurse at [facility] left a voicemail stating that they just noticed today that patient has a bad case of hemorrhoids that are uncomfortable for her. Lovey Newcomer is requesting an order for treatment to be faxed to [phone]. Lovey Newcomer also wanted to update on the fact that patient is still losing weight and does not want to eat. They are trying her on endure, but patient does not seem to want to chew and swallow her food.  Lovey Newcomer stated if you need to reach her [phone].

## 2019-06-17 NOTE — Telephone Encounter (Signed)
Rx faxed.  Left VM for [name] letting her know Dr. Marliss Coots comments and requested call back to answer Dr. Marliss Coots questions

## 2019-06-17 NOTE — Telephone Encounter (Signed)
I do not have any new ideas besides finding foods she likes  Do they still think it is a mouth pain issue or a lack of appetite?  Let me know if the hemorrhoids are bleeding I am printing anusol hc cream to send /fax In IN box

## 2019-07-23 ENCOUNTER — Other Ambulatory Visit: Payer: Self-pay | Admitting: Family Medicine

## 2019-08-12 ENCOUNTER — Telehealth: Payer: Self-pay

## 2019-08-12 NOTE — Telephone Encounter (Signed)
I set is up to send (? If it goes to a pharmacy or if it is just a verbal order)  Call her daughter first before sending to make sure she is on board with it   Thanks

## 2019-08-12 NOTE — Telephone Encounter (Signed)
[facility] nurse notified as instructed by telephone. Lovey Newcomer stated to disregard the request for a refill. Lovey Newcomer stated that she has talked with the family and they are going to pick it up over the counter and bring it to her.

## 2019-08-12 NOTE — Telephone Encounter (Signed)
Nicanor Bake nurse with [facility] left v/m requesting refill miralax 17g taking 17 g po daily. Not on current med list; found on hx med list for 255g given on 02/22/2011 by Dr Verl Blalock.Please advise.

## 2019-09-11 ENCOUNTER — Telehealth: Payer: Self-pay | Admitting: *Deleted

## 2019-09-11 DIAGNOSIS — R1310 Dysphagia, unspecified: Secondary | ICD-10-CM

## 2019-09-11 NOTE — Telephone Encounter (Signed)
Patient's daughter left a voicemail stating that Automotive engineer at [facility] advised her that patient is still having trouble chewing and swallowing. [name] wants to know what [name] recommends and request a call back.?

## 2019-09-11 NOTE — Telephone Encounter (Signed)
Nurse called daughter Margaret Hicks) and they told her she is still having a hard time chewing. [name] said that she knows she had to have her esophagus stretched in the past so she said she doesn't know if it's from the chewing or maybe her esophagus is narrow again and she is having a hard time swallowing but she just couldn't say for sure given pt's dementia. [name] would like a referral to an ENT but she is asking if [name] can refer her to an ENT in [place] or [place] because that's closer to them

## 2019-09-11 NOTE — Telephone Encounter (Signed)
We could not figure it out before - but concerned this continues  Can they tell her if she seems to have more pain when a) chewing or b) swallowing  My inclination is to refer her to ENT Would they be ok with that?

## 2019-09-12 NOTE — Telephone Encounter (Signed)
Appt Dr Constance Holster 09/17/19 at 12:45pm. Only 1 Dr in [place] and he was scheduling out into December

## 2019-09-17 DIAGNOSIS — R1314 Dysphagia, pharyngoesophageal phase: Secondary | ICD-10-CM | POA: Diagnosis not present

## 2019-09-17 DIAGNOSIS — R1310 Dysphagia, unspecified: Secondary | ICD-10-CM | POA: Diagnosis not present

## 2019-09-17 DIAGNOSIS — F0390 Unspecified dementia without behavioral disturbance: Secondary | ICD-10-CM | POA: Diagnosis not present

## 2019-09-20 ENCOUNTER — Other Ambulatory Visit: Payer: Self-pay | Admitting: Otolaryngology

## 2019-09-20 DIAGNOSIS — R1314 Dysphagia, pharyngoesophageal phase: Secondary | ICD-10-CM

## 2019-09-25 ENCOUNTER — Ambulatory Visit
Admission: RE | Admit: 2019-09-25 | Discharge: 2019-09-25 | Disposition: A | Discharge status: Home / Self Care | Payer: Medicare Other | Source: Ambulatory Visit | Attending: Otolaryngology | Admitting: Otolaryngology

## 2019-09-25 ENCOUNTER — Other Ambulatory Visit: Payer: Self-pay | Admitting: Otolaryngology

## 2019-09-25 DIAGNOSIS — K224 Dyskinesia of esophagus: Secondary | ICD-10-CM | POA: Diagnosis not present

## 2019-09-25 DIAGNOSIS — R1314 Dysphagia, pharyngoesophageal phase: Secondary | ICD-10-CM

## 2019-09-27 ENCOUNTER — Telehealth: Payer: Self-pay

## 2019-09-27 NOTE — Telephone Encounter (Signed)
Dementia is most likely causing her eating problems. - ? As to whether she would want a feeding tube at this point to prolong life  Most likely no  She had to discuss with more family before a decision is made and let us know  She is having trouble feeding herself regardless and will need a move to skilled care-=family is considering [facility]

## 2019-09-27 NOTE — Telephone Encounter (Signed)
Elson Areas, patient's daughter in law, called and states that patient had Barium Swallow study done and was told there was nothing mechanically wrong to explain the trouble with swallowing and recommended feeding tube placement. [name] would like to see if Dr Glori Bickers could call her back to discuss this process and help them understand everything that is going on. She would appreciate Dr Marliss Coots input if she could speak with them. CB [phone]

## 2019-09-30 ENCOUNTER — Telehealth: Payer: Self-pay | Admitting: *Deleted

## 2019-09-30 NOTE — Telephone Encounter (Signed)
[name] (patient's daughter) left a voicemail stating that she would like an order to change her mom's food order at [employer]. [name] stated that she would like her food order changed to soft food, pureed food and add some boost or ensure to her orders. [name] stated that [employer] is requiring an order from her doctor to make this change. [employer] [phone]

## 2019-09-30 NOTE — Telephone Encounter (Signed)
Done and in IN box 

## 2019-10-01 DIAGNOSIS — Z23 Encounter for immunization: Secondary | ICD-10-CM | POA: Diagnosis not present

## 2019-10-01 MED ORDER — ALBUTEROL SULFATE (2.5 MG/3ML) 0.083% IN NEBU: 2.5000 mg | INHALATION_SOLUTION | RESPIRATORY_TRACT | 5 refills | Status: DC | PRN

## 2019-10-01 NOTE — Addendum Note (Signed)
Addended by: Tammi Sou on: 10/01/2019 10:17 AM   Modules accepted: Orders

## 2019-10-01 NOTE — Telephone Encounter (Addendum)
Order faxed and daughter [name] notified

## 2019-10-03 DIAGNOSIS — I739 Peripheral vascular disease, unspecified: Secondary | ICD-10-CM | POA: Diagnosis not present

## 2019-10-03 DIAGNOSIS — B351 Tinea unguium: Secondary | ICD-10-CM | POA: Diagnosis not present

## 2019-10-03 DIAGNOSIS — M79674 Pain in right toe(s): Secondary | ICD-10-CM | POA: Diagnosis not present

## 2019-10-03 DIAGNOSIS — L97911 Non-pressure chronic ulcer of unspecified part of right lower leg limited to breakdown of skin: Secondary | ICD-10-CM | POA: Diagnosis not present

## 2019-10-03 DIAGNOSIS — M79675 Pain in left toe(s): Secondary | ICD-10-CM | POA: Diagnosis not present

## 2019-11-17 ENCOUNTER — Other Ambulatory Visit: Payer: Self-pay | Admitting: Family Medicine

## 2019-11-28 DIAGNOSIS — Z23 Encounter for immunization: Secondary | ICD-10-CM | POA: Diagnosis not present

## 2019-12-04 DIAGNOSIS — U071 COVID-19: Secondary | ICD-10-CM | POA: Diagnosis not present

## 2019-12-06 ENCOUNTER — Telehealth: Payer: Self-pay | Admitting: *Deleted

## 2019-12-06 NOTE — Telephone Encounter (Signed)
I would not start prednisone unless she is wheezing- let me know if she is

## 2019-12-06 NOTE — Telephone Encounter (Signed)
[name] notified of Dr. Royden Purl comments

## 2019-12-06 NOTE — Telephone Encounter (Signed)
[name] nurse at [facility] called stating that they have a covid outbreak at their facility and patient has come back positive. Margaret Hicks stated that patient has a cough and some chest congestion, but that is normal for her. Margaret Hicks stated that she has albuterol treatments as needed and she has been getting those. Margaret Hicks stated that the  patient seems to be resting comfortably. Margaret Hicks stated that her temperature now if 99.4 axillary. Margaret Hicks wants to know if Dr. Milinda Antis wants to start any prophylaxis treatment such as steroids? ER precautions were given to [name] and she verbalized understanding.

## 2019-12-20 DIAGNOSIS — S52124S Nondisplaced fracture of head of right radius, sequela: Secondary | ICD-10-CM | POA: Diagnosis not present

## 2019-12-20 DIAGNOSIS — I272 Pulmonary hypertension, unspecified: Secondary | ICD-10-CM | POA: Diagnosis present

## 2019-12-20 DIAGNOSIS — G309 Alzheimer's disease, unspecified: Secondary | ICD-10-CM | POA: Diagnosis present

## 2019-12-20 DIAGNOSIS — N1832 Chronic kidney disease, stage 3b: Secondary | ICD-10-CM | POA: Diagnosis present

## 2019-12-20 DIAGNOSIS — M79662 Pain in left lower leg: Secondary | ICD-10-CM | POA: Diagnosis not present

## 2019-12-20 DIAGNOSIS — U071 COVID-19: Secondary | ICD-10-CM | POA: Diagnosis present

## 2019-12-20 DIAGNOSIS — R278 Other lack of coordination: Secondary | ICD-10-CM | POA: Diagnosis not present

## 2019-12-20 DIAGNOSIS — J988 Other specified respiratory disorders: Secondary | ICD-10-CM | POA: Diagnosis not present

## 2019-12-20 DIAGNOSIS — Z743 Need for continuous supervision: Secondary | ICD-10-CM | POA: Diagnosis not present

## 2019-12-20 DIAGNOSIS — Z741 Need for assistance with personal care: Secondary | ICD-10-CM | POA: Diagnosis not present

## 2019-12-20 DIAGNOSIS — R531 Weakness: Secondary | ICD-10-CM | POA: Diagnosis not present

## 2019-12-20 DIAGNOSIS — R0602 Shortness of breath: Secondary | ICD-10-CM | POA: Diagnosis not present

## 2019-12-20 DIAGNOSIS — J9601 Acute respiratory failure with hypoxia: Secondary | ICD-10-CM | POA: Diagnosis present

## 2019-12-20 DIAGNOSIS — R739 Hyperglycemia, unspecified: Secondary | ICD-10-CM | POA: Diagnosis present

## 2019-12-20 DIAGNOSIS — R5381 Other malaise: Secondary | ICD-10-CM | POA: Diagnosis not present

## 2019-12-20 DIAGNOSIS — Z8616 Personal history of COVID-19: Secondary | ICD-10-CM | POA: Diagnosis not present

## 2019-12-20 DIAGNOSIS — N3001 Acute cystitis with hematuria: Secondary | ICD-10-CM | POA: Diagnosis present

## 2019-12-20 DIAGNOSIS — R945 Abnormal results of liver function studies: Secondary | ICD-10-CM | POA: Diagnosis not present

## 2019-12-20 DIAGNOSIS — N179 Acute kidney failure, unspecified: Secondary | ICD-10-CM | POA: Diagnosis present

## 2019-12-20 DIAGNOSIS — F0280 Dementia in other diseases classified elsewhere without behavioral disturbance: Secondary | ICD-10-CM | POA: Diagnosis present

## 2019-12-20 DIAGNOSIS — I129 Hypertensive chronic kidney disease with stage 1 through stage 4 chronic kidney disease, or unspecified chronic kidney disease: Secondary | ICD-10-CM | POA: Diagnosis not present

## 2019-12-20 DIAGNOSIS — I2699 Other pulmonary embolism without acute cor pulmonale: Secondary | ICD-10-CM | POA: Diagnosis not present

## 2019-12-20 DIAGNOSIS — S52125D Nondisplaced fracture of head of left radius, subsequent encounter for closed fracture with routine healing: Secondary | ICD-10-CM | POA: Diagnosis not present

## 2019-12-20 DIAGNOSIS — R1312 Dysphagia, oropharyngeal phase: Secondary | ICD-10-CM | POA: Diagnosis not present

## 2019-12-20 DIAGNOSIS — B9620 Unspecified Escherichia coli [E. coli] as the cause of diseases classified elsewhere: Secondary | ICD-10-CM | POA: Diagnosis not present

## 2019-12-20 DIAGNOSIS — S42294D Other nondisplaced fracture of upper end of right humerus, subsequent encounter for fracture with routine healing: Secondary | ICD-10-CM | POA: Diagnosis not present

## 2019-12-20 DIAGNOSIS — I10 Essential (primary) hypertension: Secondary | ICD-10-CM | POA: Diagnosis not present

## 2019-12-20 DIAGNOSIS — I214 Non-ST elevation (NSTEMI) myocardial infarction: Secondary | ICD-10-CM | POA: Diagnosis present

## 2019-12-20 DIAGNOSIS — Z79899 Other long term (current) drug therapy: Secondary | ICD-10-CM | POA: Diagnosis not present

## 2019-12-20 DIAGNOSIS — F0281 Dementia in other diseases classified elsewhere with behavioral disturbance: Secondary | ICD-10-CM | POA: Diagnosis not present

## 2019-12-20 DIAGNOSIS — E875 Hyperkalemia: Secondary | ICD-10-CM | POA: Diagnosis present

## 2019-12-20 DIAGNOSIS — M6281 Muscle weakness (generalized): Secondary | ICD-10-CM | POA: Diagnosis not present

## 2019-12-20 DIAGNOSIS — R0689 Other abnormalities of breathing: Secondary | ICD-10-CM | POA: Diagnosis not present

## 2019-12-20 DIAGNOSIS — F339 Major depressive disorder, recurrent, unspecified: Secondary | ICD-10-CM | POA: Diagnosis not present

## 2019-12-20 DIAGNOSIS — D72829 Elevated white blood cell count, unspecified: Secondary | ICD-10-CM | POA: Diagnosis not present

## 2019-12-20 DIAGNOSIS — G9340 Encephalopathy, unspecified: Secondary | ICD-10-CM | POA: Diagnosis not present

## 2019-12-20 DIAGNOSIS — R279 Unspecified lack of coordination: Secondary | ICD-10-CM | POA: Diagnosis not present

## 2019-12-20 DIAGNOSIS — E039 Hypothyroidism, unspecified: Secondary | ICD-10-CM | POA: Diagnosis present

## 2019-12-20 DIAGNOSIS — Z792 Long term (current) use of antibiotics: Secondary | ICD-10-CM | POA: Diagnosis not present

## 2019-12-20 DIAGNOSIS — N189 Chronic kidney disease, unspecified: Secondary | ICD-10-CM | POA: Diagnosis not present

## 2019-12-20 DIAGNOSIS — R0902 Hypoxemia: Secondary | ICD-10-CM | POA: Diagnosis not present

## 2019-12-20 DIAGNOSIS — S62102S Fracture of unspecified carpal bone, left wrist, sequela: Secondary | ICD-10-CM | POA: Diagnosis not present

## 2019-12-20 DIAGNOSIS — G92 Toxic encephalopathy: Secondary | ICD-10-CM | POA: Diagnosis present

## 2019-12-20 DIAGNOSIS — R55 Syncope and collapse: Secondary | ICD-10-CM | POA: Diagnosis not present

## 2019-12-20 DIAGNOSIS — R404 Transient alteration of awareness: Secondary | ICD-10-CM | POA: Diagnosis not present

## 2019-12-20 DIAGNOSIS — Z66 Do not resuscitate: Secondary | ICD-10-CM | POA: Diagnosis present

## 2019-12-20 DIAGNOSIS — M79661 Pain in right lower leg: Secondary | ICD-10-CM | POA: Diagnosis not present

## 2019-12-21 DIAGNOSIS — R0902 Hypoxemia: Secondary | ICD-10-CM | POA: Diagnosis not present

## 2019-12-22 DIAGNOSIS — R0902 Hypoxemia: Secondary | ICD-10-CM | POA: Diagnosis not present

## 2019-12-22 DIAGNOSIS — J9601 Acute respiratory failure with hypoxia: Secondary | ICD-10-CM | POA: Diagnosis not present

## 2019-12-23 DIAGNOSIS — M79661 Pain in right lower leg: Secondary | ICD-10-CM | POA: Diagnosis not present

## 2019-12-23 DIAGNOSIS — M79662 Pain in left lower leg: Secondary | ICD-10-CM | POA: Diagnosis not present

## 2019-12-23 DIAGNOSIS — E039 Hypothyroidism, unspecified: Secondary | ICD-10-CM | POA: Diagnosis not present

## 2019-12-23 DIAGNOSIS — G309 Alzheimer's disease, unspecified: Secondary | ICD-10-CM | POA: Diagnosis not present

## 2019-12-23 DIAGNOSIS — I129 Hypertensive chronic kidney disease with stage 1 through stage 4 chronic kidney disease, or unspecified chronic kidney disease: Secondary | ICD-10-CM | POA: Diagnosis not present

## 2019-12-23 DIAGNOSIS — J9601 Acute respiratory failure with hypoxia: Secondary | ICD-10-CM | POA: Diagnosis not present

## 2019-12-23 DIAGNOSIS — N3001 Acute cystitis with hematuria: Secondary | ICD-10-CM | POA: Diagnosis not present

## 2019-12-23 DIAGNOSIS — N1832 Chronic kidney disease, stage 3b: Secondary | ICD-10-CM | POA: Diagnosis not present

## 2019-12-23 DIAGNOSIS — Z792 Long term (current) use of antibiotics: Secondary | ICD-10-CM | POA: Diagnosis not present

## 2019-12-23 DIAGNOSIS — F0280 Dementia in other diseases classified elsewhere without behavioral disturbance: Secondary | ICD-10-CM | POA: Diagnosis not present

## 2019-12-23 DIAGNOSIS — Z8616 Personal history of covid-19: Secondary | ICD-10-CM | POA: Diagnosis not present

## 2019-12-23 DIAGNOSIS — Z79899 Other long term (current) drug therapy: Secondary | ICD-10-CM | POA: Diagnosis not present

## 2019-12-23 DIAGNOSIS — N179 Acute kidney failure, unspecified: Secondary | ICD-10-CM | POA: Diagnosis not present

## 2019-12-23 DIAGNOSIS — G9340 Encephalopathy, unspecified: Secondary | ICD-10-CM | POA: Diagnosis not present

## 2019-12-24 DIAGNOSIS — I129 Hypertensive chronic kidney disease with stage 1 through stage 4 chronic kidney disease, or unspecified chronic kidney disease: Secondary | ICD-10-CM | POA: Diagnosis not present

## 2019-12-24 DIAGNOSIS — Z792 Long term (current) use of antibiotics: Secondary | ICD-10-CM | POA: Diagnosis not present

## 2019-12-24 DIAGNOSIS — G9340 Encephalopathy, unspecified: Secondary | ICD-10-CM | POA: Diagnosis not present

## 2019-12-24 DIAGNOSIS — E039 Hypothyroidism, unspecified: Secondary | ICD-10-CM | POA: Diagnosis not present

## 2019-12-24 DIAGNOSIS — N3001 Acute cystitis with hematuria: Secondary | ICD-10-CM | POA: Diagnosis not present

## 2019-12-24 DIAGNOSIS — B9620 Unspecified Escherichia coli [E. coli] as the cause of diseases classified elsewhere: Secondary | ICD-10-CM | POA: Diagnosis not present

## 2019-12-24 DIAGNOSIS — J9601 Acute respiratory failure with hypoxia: Secondary | ICD-10-CM | POA: Diagnosis not present

## 2019-12-24 DIAGNOSIS — G309 Alzheimer's disease, unspecified: Secondary | ICD-10-CM | POA: Diagnosis not present

## 2019-12-24 DIAGNOSIS — F0280 Dementia in other diseases classified elsewhere without behavioral disturbance: Secondary | ICD-10-CM | POA: Diagnosis not present

## 2019-12-24 DIAGNOSIS — N179 Acute kidney failure, unspecified: Secondary | ICD-10-CM | POA: Diagnosis not present

## 2019-12-24 DIAGNOSIS — Z8616 Personal history of covid-19: Secondary | ICD-10-CM | POA: Diagnosis not present

## 2019-12-24 DIAGNOSIS — N1832 Chronic kidney disease, stage 3b: Secondary | ICD-10-CM | POA: Diagnosis not present

## 2019-12-25 DIAGNOSIS — U071 COVID-19: Secondary | ICD-10-CM | POA: Diagnosis not present

## 2019-12-25 DIAGNOSIS — J9601 Acute respiratory failure with hypoxia: Secondary | ICD-10-CM | POA: Diagnosis not present

## 2019-12-25 DIAGNOSIS — J988 Other specified respiratory disorders: Secondary | ICD-10-CM | POA: Diagnosis not present

## 2019-12-25 DIAGNOSIS — I214 Non-ST elevation (NSTEMI) myocardial infarction: Secondary | ICD-10-CM | POA: Diagnosis not present

## 2019-12-26 DIAGNOSIS — U071 COVID-19: Secondary | ICD-10-CM | POA: Diagnosis not present

## 2019-12-26 DIAGNOSIS — J9601 Acute respiratory failure with hypoxia: Secondary | ICD-10-CM | POA: Diagnosis not present

## 2019-12-26 DIAGNOSIS — J988 Other specified respiratory disorders: Secondary | ICD-10-CM | POA: Diagnosis not present

## 2019-12-26 DIAGNOSIS — I214 Non-ST elevation (NSTEMI) myocardial infarction: Secondary | ICD-10-CM | POA: Diagnosis not present

## 2019-12-27 DIAGNOSIS — I2699 Other pulmonary embolism without acute cor pulmonale: Secondary | ICD-10-CM | POA: Diagnosis not present

## 2019-12-27 DIAGNOSIS — D72829 Elevated white blood cell count, unspecified: Secondary | ICD-10-CM | POA: Diagnosis not present

## 2019-12-27 DIAGNOSIS — U071 COVID-19: Secondary | ICD-10-CM | POA: Diagnosis not present

## 2019-12-27 DIAGNOSIS — J9601 Acute respiratory failure with hypoxia: Secondary | ICD-10-CM | POA: Diagnosis not present

## 2019-12-27 DIAGNOSIS — J988 Other specified respiratory disorders: Secondary | ICD-10-CM | POA: Diagnosis not present

## 2019-12-27 DIAGNOSIS — I214 Non-ST elevation (NSTEMI) myocardial infarction: Secondary | ICD-10-CM | POA: Diagnosis not present

## 2019-12-28 DIAGNOSIS — J988 Other specified respiratory disorders: Secondary | ICD-10-CM | POA: Diagnosis not present

## 2019-12-28 DIAGNOSIS — I2699 Other pulmonary embolism without acute cor pulmonale: Secondary | ICD-10-CM | POA: Diagnosis not present

## 2019-12-28 DIAGNOSIS — I214 Non-ST elevation (NSTEMI) myocardial infarction: Secondary | ICD-10-CM | POA: Diagnosis not present

## 2019-12-28 DIAGNOSIS — U071 COVID-19: Secondary | ICD-10-CM | POA: Diagnosis not present

## 2019-12-28 DIAGNOSIS — J9601 Acute respiratory failure with hypoxia: Secondary | ICD-10-CM | POA: Diagnosis not present

## 2019-12-29 DIAGNOSIS — I214 Non-ST elevation (NSTEMI) myocardial infarction: Secondary | ICD-10-CM | POA: Diagnosis not present

## 2019-12-29 DIAGNOSIS — J9601 Acute respiratory failure with hypoxia: Secondary | ICD-10-CM | POA: Diagnosis not present

## 2019-12-29 DIAGNOSIS — I2699 Other pulmonary embolism without acute cor pulmonale: Secondary | ICD-10-CM | POA: Diagnosis not present

## 2019-12-29 DIAGNOSIS — U071 COVID-19: Secondary | ICD-10-CM | POA: Diagnosis not present

## 2019-12-29 DIAGNOSIS — J988 Other specified respiratory disorders: Secondary | ICD-10-CM | POA: Diagnosis not present

## 2020-01-01 ENCOUNTER — Telehealth: Payer: Self-pay | Admitting: *Deleted

## 2020-01-01 ENCOUNTER — Ambulatory Visit: Payer: Medicare Other | Admitting: Family Medicine

## 2020-01-01 ENCOUNTER — Other Ambulatory Visit: Payer: Self-pay

## 2020-01-01 DIAGNOSIS — I214 Non-ST elevation (NSTEMI) myocardial infarction: Secondary | ICD-10-CM | POA: Diagnosis not present

## 2020-01-01 DIAGNOSIS — R0902 Hypoxemia: Secondary | ICD-10-CM | POA: Diagnosis not present

## 2020-01-01 DIAGNOSIS — M79632 Pain in left forearm: Secondary | ICD-10-CM | POA: Diagnosis not present

## 2020-01-01 DIAGNOSIS — M6281 Muscle weakness (generalized): Secondary | ICD-10-CM | POA: Diagnosis not present

## 2020-01-01 DIAGNOSIS — G309 Alzheimer's disease, unspecified: Secondary | ICD-10-CM | POA: Diagnosis not present

## 2020-01-01 DIAGNOSIS — R4182 Altered mental status, unspecified: Secondary | ICD-10-CM | POA: Diagnosis not present

## 2020-01-01 DIAGNOSIS — R404 Transient alteration of awareness: Secondary | ICD-10-CM | POA: Diagnosis not present

## 2020-01-01 DIAGNOSIS — F339 Major depressive disorder, recurrent, unspecified: Secondary | ICD-10-CM | POA: Diagnosis not present

## 2020-01-01 DIAGNOSIS — S4991XA Unspecified injury of right shoulder and upper arm, initial encounter: Secondary | ICD-10-CM | POA: Diagnosis not present

## 2020-01-01 DIAGNOSIS — S52124S Nondisplaced fracture of head of right radius, sequela: Secondary | ICD-10-CM | POA: Diagnosis not present

## 2020-01-01 DIAGNOSIS — I151 Hypertension secondary to other renal disorders: Secondary | ICD-10-CM | POA: Diagnosis not present

## 2020-01-01 DIAGNOSIS — E039 Hypothyroidism, unspecified: Secondary | ICD-10-CM | POA: Diagnosis not present

## 2020-01-01 DIAGNOSIS — Z741 Need for assistance with personal care: Secondary | ICD-10-CM | POA: Diagnosis not present

## 2020-01-01 DIAGNOSIS — F0281 Dementia in other diseases classified elsewhere with behavioral disturbance: Secondary | ICD-10-CM | POA: Diagnosis not present

## 2020-01-01 DIAGNOSIS — M8000XS Age-related osteoporosis with current pathological fracture, unspecified site, sequela: Secondary | ICD-10-CM | POA: Diagnosis not present

## 2020-01-01 DIAGNOSIS — M79642 Pain in left hand: Secondary | ICD-10-CM | POA: Diagnosis not present

## 2020-01-01 DIAGNOSIS — Z79899 Other long term (current) drug therapy: Secondary | ICD-10-CM | POA: Diagnosis not present

## 2020-01-01 DIAGNOSIS — R279 Unspecified lack of coordination: Secondary | ICD-10-CM | POA: Diagnosis not present

## 2020-01-01 DIAGNOSIS — Z743 Need for continuous supervision: Secondary | ICD-10-CM | POA: Diagnosis not present

## 2020-01-01 DIAGNOSIS — S59912S Unspecified injury of left forearm, sequela: Secondary | ICD-10-CM | POA: Diagnosis not present

## 2020-01-01 DIAGNOSIS — R1312 Dysphagia, oropharyngeal phase: Secondary | ICD-10-CM | POA: Diagnosis not present

## 2020-01-01 DIAGNOSIS — R4020 Unspecified coma: Secondary | ICD-10-CM | POA: Diagnosis not present

## 2020-01-01 DIAGNOSIS — R410 Disorientation, unspecified: Secondary | ICD-10-CM | POA: Diagnosis not present

## 2020-01-01 DIAGNOSIS — S52125D Nondisplaced fracture of head of left radius, subsequent encounter for closed fracture with routine healing: Secondary | ICD-10-CM | POA: Diagnosis not present

## 2020-01-01 DIAGNOSIS — S52124A Nondisplaced fracture of head of right radius, initial encounter for closed fracture: Secondary | ICD-10-CM | POA: Diagnosis not present

## 2020-01-01 DIAGNOSIS — I10 Essential (primary) hypertension: Secondary | ICD-10-CM | POA: Diagnosis not present

## 2020-01-01 DIAGNOSIS — S62102S Fracture of unspecified carpal bone, left wrist, sequela: Secondary | ICD-10-CM | POA: Diagnosis not present

## 2020-01-01 DIAGNOSIS — M2550 Pain in unspecified joint: Secondary | ICD-10-CM | POA: Diagnosis not present

## 2020-01-01 DIAGNOSIS — F0390 Unspecified dementia without behavioral disturbance: Secondary | ICD-10-CM | POA: Diagnosis not present

## 2020-01-01 DIAGNOSIS — I252 Old myocardial infarction: Secondary | ICD-10-CM | POA: Diagnosis not present

## 2020-01-01 DIAGNOSIS — S62102A Fracture of unspecified carpal bone, left wrist, initial encounter for closed fracture: Secondary | ICD-10-CM | POA: Diagnosis not present

## 2020-01-01 DIAGNOSIS — R278 Other lack of coordination: Secondary | ICD-10-CM | POA: Diagnosis not present

## 2020-01-01 DIAGNOSIS — N1832 Chronic kidney disease, stage 3b: Secondary | ICD-10-CM | POA: Diagnosis not present

## 2020-01-01 DIAGNOSIS — Z7401 Bed confinement status: Secondary | ICD-10-CM | POA: Diagnosis not present

## 2020-01-01 DIAGNOSIS — U071 COVID-19: Secondary | ICD-10-CM | POA: Diagnosis not present

## 2020-01-01 DIAGNOSIS — R531 Weakness: Secondary | ICD-10-CM | POA: Diagnosis not present

## 2020-01-01 DIAGNOSIS — R55 Syncope and collapse: Secondary | ICD-10-CM | POA: Diagnosis not present

## 2020-01-01 DIAGNOSIS — S59902A Unspecified injury of left elbow, initial encounter: Secondary | ICD-10-CM | POA: Diagnosis not present

## 2020-01-01 DIAGNOSIS — R5381 Other malaise: Secondary | ICD-10-CM | POA: Diagnosis not present

## 2020-01-01 DIAGNOSIS — S42294D Other nondisplaced fracture of upper end of right humerus, subsequent encounter for fracture with routine healing: Secondary | ICD-10-CM | POA: Diagnosis not present

## 2020-01-01 MED ORDER — LORAZEPAM 2 MG/ML IJ SOLN
0.50 | INTRAMUSCULAR | Status: DC
Start: ? — End: 2020-01-01

## 2020-01-01 MED ORDER — APIXABAN 5 MG PO TABS
10.00 | ORAL_TABLET | ORAL | Status: DC
Start: 2020-01-01 — End: 2020-01-01

## 2020-01-01 MED ORDER — AMLODIPINE BESYLATE 5 MG PO TABS
2.50 | ORAL_TABLET | ORAL | Status: DC
Start: 2020-01-02 — End: 2020-01-01

## 2020-01-01 MED ORDER — METOPROLOL SUCCINATE ER 50 MG PO TB24
50.00 | ORAL_TABLET | ORAL | Status: DC
Start: 2020-01-02 — End: 2020-01-01

## 2020-01-01 MED ORDER — SERTRALINE HCL 50 MG PO TABS
50.00 | ORAL_TABLET | ORAL | Status: DC
Start: 2020-01-02 — End: 2020-01-01

## 2020-01-01 MED ORDER — DEXTROSE 50 % IV SOLN
12.50 | INTRAVENOUS | Status: DC
Start: ? — End: 2020-01-01

## 2020-01-01 MED ORDER — POLYETHYLENE GLYCOL 3350 17 GM/SCOOP PO POWD
17.00 | ORAL | Status: DC
Start: 2020-01-02 — End: 2020-01-01

## 2020-01-01 MED ORDER — LOSARTAN POTASSIUM 25 MG PO TABS
25.00 | ORAL_TABLET | ORAL | Status: DC
Start: 2020-01-02 — End: 2020-01-01

## 2020-01-01 MED ORDER — INSULIN LISPRO 100 UNIT/ML ~~LOC~~ SOLN
0.00 | SUBCUTANEOUS | Status: DC
Start: 2020-01-01 — End: 2020-01-01

## 2020-01-01 MED ORDER — LEVOTHYROXINE SODIUM 100 MCG PO TABS
100.00 | ORAL_TABLET | ORAL | Status: DC
Start: 2020-01-02 — End: 2020-01-01

## 2020-01-01 MED ORDER — SENNOSIDES 8.6 MG PO TABS
2.00 | ORAL_TABLET | ORAL | Status: DC
Start: 2020-01-01 — End: 2020-01-01

## 2020-01-01 MED ORDER — APIXABAN 5 MG PO TABS
5.00 | ORAL_TABLET | ORAL | Status: DC
Start: ? — End: 2020-01-01

## 2020-01-01 MED ORDER — ACETAMINOPHEN 500 MG PO TABS
1000.00 | ORAL_TABLET | ORAL | Status: DC
Start: ? — End: 2020-01-01

## 2020-01-01 NOTE — Telephone Encounter (Signed)
Aware Will follow

## 2020-01-01 NOTE — Telephone Encounter (Signed)
Called patient to change her appointment time today to 12:30 pm. Spoke to patient's daughter-in-law [name] and was advised that patient is still in the hospital and will not be available for appointment today. [name] requested that appointment be cancelled.

## 2020-01-03 DIAGNOSIS — E039 Hypothyroidism, unspecified: Secondary | ICD-10-CM | POA: Diagnosis not present

## 2020-01-03 DIAGNOSIS — S62102A Fracture of unspecified carpal bone, left wrist, initial encounter for closed fracture: Secondary | ICD-10-CM | POA: Diagnosis not present

## 2020-01-03 DIAGNOSIS — I10 Essential (primary) hypertension: Secondary | ICD-10-CM | POA: Diagnosis not present

## 2020-01-03 DIAGNOSIS — U071 COVID-19: Secondary | ICD-10-CM | POA: Diagnosis not present

## 2020-01-03 DIAGNOSIS — I214 Non-ST elevation (NSTEMI) myocardial infarction: Secondary | ICD-10-CM | POA: Diagnosis not present

## 2020-01-03 DIAGNOSIS — S4991XA Unspecified injury of right shoulder and upper arm, initial encounter: Secondary | ICD-10-CM | POA: Diagnosis not present

## 2020-01-03 DIAGNOSIS — S52124A Nondisplaced fracture of head of right radius, initial encounter for closed fracture: Secondary | ICD-10-CM | POA: Diagnosis not present

## 2020-01-03 DIAGNOSIS — S59912S Unspecified injury of left forearm, sequela: Secondary | ICD-10-CM | POA: Diagnosis not present

## 2020-01-07 DIAGNOSIS — S4991XA Unspecified injury of right shoulder and upper arm, initial encounter: Secondary | ICD-10-CM | POA: Diagnosis not present

## 2020-01-07 DIAGNOSIS — S59902A Unspecified injury of left elbow, initial encounter: Secondary | ICD-10-CM | POA: Diagnosis not present

## 2020-01-07 DIAGNOSIS — S62102A Fracture of unspecified carpal bone, left wrist, initial encounter for closed fracture: Secondary | ICD-10-CM | POA: Diagnosis not present

## 2020-01-07 DIAGNOSIS — S59912S Unspecified injury of left forearm, sequela: Secondary | ICD-10-CM | POA: Diagnosis not present

## 2020-01-07 DIAGNOSIS — S52124A Nondisplaced fracture of head of right radius, initial encounter for closed fracture: Secondary | ICD-10-CM | POA: Diagnosis not present

## 2020-01-08 DIAGNOSIS — E039 Hypothyroidism, unspecified: Secondary | ICD-10-CM | POA: Diagnosis not present

## 2020-01-08 DIAGNOSIS — S52124S Nondisplaced fracture of head of right radius, sequela: Secondary | ICD-10-CM | POA: Diagnosis not present

## 2020-01-08 DIAGNOSIS — S42294D Other nondisplaced fracture of upper end of right humerus, subsequent encounter for fracture with routine healing: Secondary | ICD-10-CM | POA: Diagnosis not present

## 2020-01-08 DIAGNOSIS — S52125D Nondisplaced fracture of head of left radius, subsequent encounter for closed fracture with routine healing: Secondary | ICD-10-CM | POA: Diagnosis not present

## 2020-01-10 DIAGNOSIS — M8000XS Age-related osteoporosis with current pathological fracture, unspecified site, sequela: Secondary | ICD-10-CM | POA: Diagnosis not present

## 2020-01-10 DIAGNOSIS — E039 Hypothyroidism, unspecified: Secondary | ICD-10-CM | POA: Diagnosis not present

## 2020-01-10 DIAGNOSIS — I151 Hypertension secondary to other renal disorders: Secondary | ICD-10-CM | POA: Diagnosis not present

## 2020-01-10 DIAGNOSIS — S62102S Fracture of unspecified carpal bone, left wrist, sequela: Secondary | ICD-10-CM | POA: Diagnosis not present

## 2020-01-11 DIAGNOSIS — I10 Essential (primary) hypertension: Secondary | ICD-10-CM | POA: Diagnosis not present

## 2020-01-11 DIAGNOSIS — I151 Hypertension secondary to other renal disorders: Secondary | ICD-10-CM | POA: Diagnosis not present

## 2020-01-11 DIAGNOSIS — U071 COVID-19: Secondary | ICD-10-CM | POA: Diagnosis not present

## 2020-01-11 DIAGNOSIS — I214 Non-ST elevation (NSTEMI) myocardial infarction: Secondary | ICD-10-CM | POA: Diagnosis not present

## 2020-01-17 ENCOUNTER — Other Ambulatory Visit: Payer: Self-pay | Admitting: Family Medicine

## 2020-01-17 DIAGNOSIS — M8000XS Age-related osteoporosis with current pathological fracture, unspecified site, sequela: Secondary | ICD-10-CM | POA: Diagnosis not present

## 2020-01-17 DIAGNOSIS — S52125D Nondisplaced fracture of head of left radius, subsequent encounter for closed fracture with routine healing: Secondary | ICD-10-CM | POA: Diagnosis not present

## 2020-01-17 DIAGNOSIS — S52124S Nondisplaced fracture of head of right radius, sequela: Secondary | ICD-10-CM | POA: Diagnosis not present

## 2020-01-17 DIAGNOSIS — I151 Hypertension secondary to other renal disorders: Secondary | ICD-10-CM | POA: Diagnosis not present

## 2020-01-19 DIAGNOSIS — I252 Old myocardial infarction: Secondary | ICD-10-CM | POA: Diagnosis not present

## 2020-01-19 DIAGNOSIS — F0390 Unspecified dementia without behavioral disturbance: Secondary | ICD-10-CM | POA: Diagnosis not present

## 2020-01-19 DIAGNOSIS — Z79899 Other long term (current) drug therapy: Secondary | ICD-10-CM | POA: Diagnosis not present

## 2020-01-19 DIAGNOSIS — I10 Essential (primary) hypertension: Secondary | ICD-10-CM | POA: Diagnosis not present

## 2020-01-20 ENCOUNTER — Other Ambulatory Visit: Payer: Self-pay | Admitting: *Deleted

## 2020-01-20 DIAGNOSIS — Z03818 Encounter for observation for suspected exposure to other biological agents ruled out: Secondary | ICD-10-CM | POA: Diagnosis not present

## 2020-01-20 DIAGNOSIS — R4182 Altered mental status, unspecified: Secondary | ICD-10-CM | POA: Diagnosis not present

## 2020-01-20 DIAGNOSIS — G301 Alzheimer's disease with late onset: Secondary | ICD-10-CM | POA: Diagnosis not present

## 2020-01-20 NOTE — Patient Outreach (Signed)
Member screened for potential [facility] needs as a benefit of [employer].  Mrs. Kelty is currently receiving skilled therapy at [facility] in [place].  Telephone call to facility dc planner. Transition plan is for long term care.    Raiford Noble, MSN-Ed, RN,BSN [facility] Post Acute Care Coordinator [phone] [facility]) [phone]  (Toll free office)

## 2020-01-24 DIAGNOSIS — M8000XS Age-related osteoporosis with current pathological fracture, unspecified site, sequela: Secondary | ICD-10-CM | POA: Diagnosis not present

## 2020-01-24 DIAGNOSIS — I151 Hypertension secondary to other renal disorders: Secondary | ICD-10-CM | POA: Diagnosis not present

## 2020-01-24 DIAGNOSIS — S52124S Nondisplaced fracture of head of right radius, sequela: Secondary | ICD-10-CM | POA: Diagnosis not present

## 2020-01-24 DIAGNOSIS — E039 Hypothyroidism, unspecified: Secondary | ICD-10-CM | POA: Diagnosis not present

## 2020-01-27 ENCOUNTER — Other Ambulatory Visit: Payer: Self-pay | Admitting: *Deleted

## 2020-01-27 DIAGNOSIS — Z03818 Encounter for observation for suspected exposure to other biological agents ruled out: Secondary | ICD-10-CM | POA: Diagnosis not present

## 2020-01-27 NOTE — Patient Outreach (Signed)
[facility] Post Acute Care Coordinator follow up  Verified in Patient Margaret Hicks member transitioned to long term care at [facility].    Raiford Noble, MSN-Ed, RN,BSN [facility] Post Acute Care Coordinator [phone] [facility]) [phone]  (Toll free office)

## 2020-01-31 DIAGNOSIS — N2889 Other specified disorders of kidney and ureter: Secondary | ICD-10-CM | POA: Diagnosis not present

## 2020-01-31 DIAGNOSIS — M8000XS Age-related osteoporosis with current pathological fracture, unspecified site, sequela: Secondary | ICD-10-CM | POA: Diagnosis not present

## 2020-01-31 DIAGNOSIS — E039 Hypothyroidism, unspecified: Secondary | ICD-10-CM | POA: Diagnosis not present

## 2020-01-31 DIAGNOSIS — I151 Hypertension secondary to other renal disorders: Secondary | ICD-10-CM | POA: Diagnosis not present

## 2020-02-01 DIAGNOSIS — I10 Essential (primary) hypertension: Secondary | ICD-10-CM | POA: Diagnosis not present

## 2020-02-01 DIAGNOSIS — I214 Non-ST elevation (NSTEMI) myocardial infarction: Secondary | ICD-10-CM | POA: Diagnosis not present

## 2020-02-01 DIAGNOSIS — E039 Hypothyroidism, unspecified: Secondary | ICD-10-CM | POA: Diagnosis not present

## 2020-02-01 DIAGNOSIS — U071 COVID-19: Secondary | ICD-10-CM | POA: Diagnosis not present

## 2020-02-03 DIAGNOSIS — Z03818 Encounter for observation for suspected exposure to other biological agents ruled out: Secondary | ICD-10-CM | POA: Diagnosis not present

## 2020-02-04 DIAGNOSIS — S4991XA Unspecified injury of right shoulder and upper arm, initial encounter: Secondary | ICD-10-CM | POA: Diagnosis not present

## 2020-02-04 DIAGNOSIS — S52124A Nondisplaced fracture of head of right radius, initial encounter for closed fracture: Secondary | ICD-10-CM | POA: Diagnosis not present

## 2020-02-04 DIAGNOSIS — R410 Disorientation, unspecified: Secondary | ICD-10-CM | POA: Diagnosis not present

## 2020-02-04 DIAGNOSIS — Z743 Need for continuous supervision: Secondary | ICD-10-CM | POA: Diagnosis not present

## 2020-02-04 DIAGNOSIS — S62102A Fracture of unspecified carpal bone, left wrist, initial encounter for closed fracture: Secondary | ICD-10-CM | POA: Diagnosis not present

## 2020-02-04 DIAGNOSIS — S59902A Unspecified injury of left elbow, initial encounter: Secondary | ICD-10-CM | POA: Diagnosis not present

## 2020-02-04 DIAGNOSIS — S59912S Unspecified injury of left forearm, sequela: Secondary | ICD-10-CM | POA: Diagnosis not present

## 2020-02-04 DIAGNOSIS — R404 Transient alteration of awareness: Secondary | ICD-10-CM | POA: Diagnosis not present

## 2020-02-04 DIAGNOSIS — Z7401 Bed confinement status: Secondary | ICD-10-CM | POA: Diagnosis not present

## 2020-02-07 DIAGNOSIS — I214 Non-ST elevation (NSTEMI) myocardial infarction: Secondary | ICD-10-CM | POA: Diagnosis not present

## 2020-02-07 DIAGNOSIS — S62102S Fracture of unspecified carpal bone, left wrist, sequela: Secondary | ICD-10-CM | POA: Diagnosis not present

## 2020-02-07 DIAGNOSIS — I10 Essential (primary) hypertension: Secondary | ICD-10-CM | POA: Diagnosis not present

## 2020-02-07 DIAGNOSIS — Z741 Need for assistance with personal care: Secondary | ICD-10-CM | POA: Diagnosis not present

## 2020-02-07 DIAGNOSIS — F339 Major depressive disorder, recurrent, unspecified: Secondary | ICD-10-CM | POA: Diagnosis not present

## 2020-02-07 DIAGNOSIS — G309 Alzheimer's disease, unspecified: Secondary | ICD-10-CM | POA: Diagnosis not present

## 2020-02-07 DIAGNOSIS — R278 Other lack of coordination: Secondary | ICD-10-CM | POA: Diagnosis not present

## 2020-02-07 DIAGNOSIS — R55 Syncope and collapse: Secondary | ICD-10-CM | POA: Diagnosis not present

## 2020-02-07 DIAGNOSIS — E039 Hypothyroidism, unspecified: Secondary | ICD-10-CM | POA: Diagnosis not present

## 2020-02-07 DIAGNOSIS — R1312 Dysphagia, oropharyngeal phase: Secondary | ICD-10-CM | POA: Diagnosis not present

## 2020-02-07 DIAGNOSIS — M6281 Muscle weakness (generalized): Secondary | ICD-10-CM | POA: Diagnosis not present

## 2020-02-07 DIAGNOSIS — N1832 Chronic kidney disease, stage 3b: Secondary | ICD-10-CM | POA: Diagnosis not present

## 2020-02-07 DIAGNOSIS — S52124S Nondisplaced fracture of head of right radius, sequela: Secondary | ICD-10-CM | POA: Diagnosis not present

## 2020-02-07 DIAGNOSIS — S52125D Nondisplaced fracture of head of left radius, subsequent encounter for closed fracture with routine healing: Secondary | ICD-10-CM | POA: Diagnosis not present

## 2020-02-07 DIAGNOSIS — S42294D Other nondisplaced fracture of upper end of right humerus, subsequent encounter for fracture with routine healing: Secondary | ICD-10-CM | POA: Diagnosis not present

## 2020-02-07 DIAGNOSIS — F0281 Dementia in other diseases classified elsewhere with behavioral disturbance: Secondary | ICD-10-CM | POA: Diagnosis not present

## 2020-02-24 DIAGNOSIS — S52125D Nondisplaced fracture of head of left radius, subsequent encounter for closed fracture with routine healing: Secondary | ICD-10-CM | POA: Diagnosis not present

## 2020-02-24 DIAGNOSIS — I214 Non-ST elevation (NSTEMI) myocardial infarction: Secondary | ICD-10-CM | POA: Diagnosis not present

## 2020-02-24 DIAGNOSIS — E039 Hypothyroidism, unspecified: Secondary | ICD-10-CM | POA: Diagnosis not present

## 2020-02-24 DIAGNOSIS — I10 Essential (primary) hypertension: Secondary | ICD-10-CM | POA: Diagnosis not present

## 2020-02-24 DIAGNOSIS — R55 Syncope and collapse: Secondary | ICD-10-CM | POA: Diagnosis not present

## 2020-02-24 DIAGNOSIS — F339 Major depressive disorder, recurrent, unspecified: Secondary | ICD-10-CM | POA: Diagnosis not present

## 2020-02-24 DIAGNOSIS — S42294D Other nondisplaced fracture of upper end of right humerus, subsequent encounter for fracture with routine healing: Secondary | ICD-10-CM | POA: Diagnosis not present

## 2020-02-24 DIAGNOSIS — S62102S Fracture of unspecified carpal bone, left wrist, sequela: Secondary | ICD-10-CM | POA: Diagnosis not present

## 2020-02-24 DIAGNOSIS — S52124S Nondisplaced fracture of head of right radius, sequela: Secondary | ICD-10-CM | POA: Diagnosis not present

## 2020-02-24 DIAGNOSIS — F0281 Dementia in other diseases classified elsewhere with behavioral disturbance: Secondary | ICD-10-CM | POA: Diagnosis not present

## 2020-02-24 DIAGNOSIS — Z741 Need for assistance with personal care: Secondary | ICD-10-CM | POA: Diagnosis not present

## 2020-02-24 DIAGNOSIS — N1832 Chronic kidney disease, stage 3b: Secondary | ICD-10-CM | POA: Diagnosis not present

## 2020-02-24 DIAGNOSIS — M6281 Muscle weakness (generalized): Secondary | ICD-10-CM | POA: Diagnosis not present

## 2020-02-24 DIAGNOSIS — R1312 Dysphagia, oropharyngeal phase: Secondary | ICD-10-CM | POA: Diagnosis not present

## 2020-02-24 DIAGNOSIS — R278 Other lack of coordination: Secondary | ICD-10-CM | POA: Diagnosis not present

## 2020-02-24 DIAGNOSIS — G309 Alzheimer's disease, unspecified: Secondary | ICD-10-CM | POA: Diagnosis not present

## 2020-02-25 DIAGNOSIS — M6281 Muscle weakness (generalized): Secondary | ICD-10-CM | POA: Diagnosis not present

## 2020-02-25 DIAGNOSIS — S62102S Fracture of unspecified carpal bone, left wrist, sequela: Secondary | ICD-10-CM | POA: Diagnosis not present

## 2020-02-25 DIAGNOSIS — S52124S Nondisplaced fracture of head of right radius, sequela: Secondary | ICD-10-CM | POA: Diagnosis not present

## 2020-02-25 DIAGNOSIS — S52125D Nondisplaced fracture of head of left radius, subsequent encounter for closed fracture with routine healing: Secondary | ICD-10-CM | POA: Diagnosis not present

## 2020-02-25 DIAGNOSIS — G309 Alzheimer's disease, unspecified: Secondary | ICD-10-CM | POA: Diagnosis not present

## 2020-02-25 DIAGNOSIS — I214 Non-ST elevation (NSTEMI) myocardial infarction: Secondary | ICD-10-CM | POA: Diagnosis not present

## 2020-02-25 DIAGNOSIS — S42294D Other nondisplaced fracture of upper end of right humerus, subsequent encounter for fracture with routine healing: Secondary | ICD-10-CM | POA: Diagnosis not present

## 2020-02-26 DIAGNOSIS — S52124S Nondisplaced fracture of head of right radius, sequela: Secondary | ICD-10-CM | POA: Diagnosis not present

## 2020-02-26 DIAGNOSIS — S42294D Other nondisplaced fracture of upper end of right humerus, subsequent encounter for fracture with routine healing: Secondary | ICD-10-CM | POA: Diagnosis not present

## 2020-02-26 DIAGNOSIS — S59912S Unspecified injury of left forearm, sequela: Secondary | ICD-10-CM | POA: Diagnosis not present

## 2020-02-26 DIAGNOSIS — S59902A Unspecified injury of left elbow, initial encounter: Secondary | ICD-10-CM | POA: Diagnosis not present

## 2020-02-26 DIAGNOSIS — S52125D Nondisplaced fracture of head of left radius, subsequent encounter for closed fracture with routine healing: Secondary | ICD-10-CM | POA: Diagnosis not present

## 2020-02-26 DIAGNOSIS — S62102S Fracture of unspecified carpal bone, left wrist, sequela: Secondary | ICD-10-CM | POA: Diagnosis not present

## 2020-02-26 DIAGNOSIS — G309 Alzheimer's disease, unspecified: Secondary | ICD-10-CM | POA: Diagnosis not present

## 2020-02-26 DIAGNOSIS — S62102A Fracture of unspecified carpal bone, left wrist, initial encounter for closed fracture: Secondary | ICD-10-CM | POA: Diagnosis not present

## 2020-02-26 DIAGNOSIS — I214 Non-ST elevation (NSTEMI) myocardial infarction: Secondary | ICD-10-CM | POA: Diagnosis not present

## 2020-02-26 DIAGNOSIS — S4991XA Unspecified injury of right shoulder and upper arm, initial encounter: Secondary | ICD-10-CM | POA: Diagnosis not present

## 2020-03-02 DIAGNOSIS — I151 Hypertension secondary to other renal disorders: Secondary | ICD-10-CM | POA: Diagnosis not present

## 2020-03-02 DIAGNOSIS — M8000XS Age-related osteoporosis with current pathological fracture, unspecified site, sequela: Secondary | ICD-10-CM | POA: Diagnosis not present

## 2020-03-02 DIAGNOSIS — J156 Pneumonia due to other aerobic Gram-negative bacteria: Secondary | ICD-10-CM | POA: Diagnosis not present

## 2020-03-02 DIAGNOSIS — E039 Hypothyroidism, unspecified: Secondary | ICD-10-CM | POA: Diagnosis not present

## 2020-03-02 DIAGNOSIS — Z03818 Encounter for observation for suspected exposure to other biological agents ruled out: Secondary | ICD-10-CM | POA: Diagnosis not present

## 2020-03-05 DIAGNOSIS — G301 Alzheimer's disease with late onset: Secondary | ICD-10-CM | POA: Diagnosis not present

## 2020-03-05 DIAGNOSIS — E039 Hypothyroidism, unspecified: Secondary | ICD-10-CM | POA: Diagnosis not present

## 2020-03-05 DIAGNOSIS — I10 Essential (primary) hypertension: Secondary | ICD-10-CM | POA: Diagnosis not present

## 2020-03-05 DIAGNOSIS — N1832 Chronic kidney disease, stage 3b: Secondary | ICD-10-CM | POA: Diagnosis not present

## 2020-03-05 DIAGNOSIS — E440 Moderate protein-calorie malnutrition: Secondary | ICD-10-CM | POA: Diagnosis not present

## 2020-03-05 DIAGNOSIS — R5383 Other fatigue: Secondary | ICD-10-CM | POA: Diagnosis not present

## 2020-03-05 DIAGNOSIS — M6281 Muscle weakness (generalized): Secondary | ICD-10-CM | POA: Diagnosis not present

## 2020-03-05 DIAGNOSIS — R278 Other lack of coordination: Secondary | ICD-10-CM | POA: Diagnosis not present

## 2020-03-05 DIAGNOSIS — F0281 Dementia in other diseases classified elsewhere with behavioral disturbance: Secondary | ICD-10-CM | POA: Diagnosis not present

## 2020-03-05 DIAGNOSIS — Z741 Need for assistance with personal care: Secondary | ICD-10-CM | POA: Diagnosis not present

## 2020-03-05 DIAGNOSIS — Z03818 Encounter for observation for suspected exposure to other biological agents ruled out: Secondary | ICD-10-CM | POA: Diagnosis not present

## 2020-03-05 DIAGNOSIS — I214 Non-ST elevation (NSTEMI) myocardial infarction: Secondary | ICD-10-CM | POA: Diagnosis not present

## 2020-03-05 DIAGNOSIS — S52125D Nondisplaced fracture of head of left radius, subsequent encounter for closed fracture with routine healing: Secondary | ICD-10-CM | POA: Diagnosis not present

## 2020-03-05 DIAGNOSIS — R55 Syncope and collapse: Secondary | ICD-10-CM | POA: Diagnosis not present

## 2020-03-05 DIAGNOSIS — G309 Alzheimer's disease, unspecified: Secondary | ICD-10-CM | POA: Diagnosis not present

## 2020-03-05 DIAGNOSIS — R634 Abnormal weight loss: Secondary | ICD-10-CM | POA: Diagnosis not present

## 2020-03-05 DIAGNOSIS — S62102S Fracture of unspecified carpal bone, left wrist, sequela: Secondary | ICD-10-CM | POA: Diagnosis not present

## 2020-03-05 DIAGNOSIS — S42294D Other nondisplaced fracture of upper end of right humerus, subsequent encounter for fracture with routine healing: Secondary | ICD-10-CM | POA: Diagnosis not present

## 2020-03-05 DIAGNOSIS — F339 Major depressive disorder, recurrent, unspecified: Secondary | ICD-10-CM | POA: Diagnosis not present

## 2020-03-05 DIAGNOSIS — S52124S Nondisplaced fracture of head of right radius, sequela: Secondary | ICD-10-CM | POA: Diagnosis not present

## 2020-03-05 DIAGNOSIS — J811 Chronic pulmonary edema: Secondary | ICD-10-CM | POA: Diagnosis not present

## 2020-03-05 DIAGNOSIS — R1312 Dysphagia, oropharyngeal phase: Secondary | ICD-10-CM | POA: Diagnosis not present

## 2020-03-06 DIAGNOSIS — R1312 Dysphagia, oropharyngeal phase: Secondary | ICD-10-CM | POA: Diagnosis not present

## 2020-03-06 DIAGNOSIS — E440 Moderate protein-calorie malnutrition: Secondary | ICD-10-CM | POA: Diagnosis not present

## 2020-03-06 DIAGNOSIS — G301 Alzheimer's disease with late onset: Secondary | ICD-10-CM | POA: Diagnosis not present

## 2020-03-16 DIAGNOSIS — I10 Essential (primary) hypertension: Secondary | ICD-10-CM | POA: Diagnosis not present

## 2020-03-16 DIAGNOSIS — E440 Moderate protein-calorie malnutrition: Secondary | ICD-10-CM | POA: Diagnosis not present

## 2020-03-16 DIAGNOSIS — R1312 Dysphagia, oropharyngeal phase: Secondary | ICD-10-CM | POA: Diagnosis not present

## 2020-03-23 DIAGNOSIS — G301 Alzheimer's disease with late onset: Secondary | ICD-10-CM | POA: Diagnosis not present

## 2020-03-23 DIAGNOSIS — R5383 Other fatigue: Secondary | ICD-10-CM | POA: Diagnosis not present

## 2020-03-25 DIAGNOSIS — S52124S Nondisplaced fracture of head of right radius, sequela: Secondary | ICD-10-CM | POA: Diagnosis not present

## 2020-03-25 DIAGNOSIS — S52125D Nondisplaced fracture of head of left radius, subsequent encounter for closed fracture with routine healing: Secondary | ICD-10-CM | POA: Diagnosis not present

## 2020-03-25 DIAGNOSIS — R55 Syncope and collapse: Secondary | ICD-10-CM | POA: Diagnosis not present

## 2020-03-25 DIAGNOSIS — I214 Non-ST elevation (NSTEMI) myocardial infarction: Secondary | ICD-10-CM | POA: Diagnosis not present

## 2020-03-25 DIAGNOSIS — Z741 Need for assistance with personal care: Secondary | ICD-10-CM | POA: Diagnosis not present

## 2020-03-25 DIAGNOSIS — F339 Major depressive disorder, recurrent, unspecified: Secondary | ICD-10-CM | POA: Diagnosis not present

## 2020-03-25 DIAGNOSIS — S62102S Fracture of unspecified carpal bone, left wrist, sequela: Secondary | ICD-10-CM | POA: Diagnosis not present

## 2020-03-25 DIAGNOSIS — E039 Hypothyroidism, unspecified: Secondary | ICD-10-CM | POA: Diagnosis not present

## 2020-03-25 DIAGNOSIS — R1312 Dysphagia, oropharyngeal phase: Secondary | ICD-10-CM | POA: Diagnosis not present

## 2020-03-25 DIAGNOSIS — R278 Other lack of coordination: Secondary | ICD-10-CM | POA: Diagnosis not present

## 2020-03-25 DIAGNOSIS — S42294D Other nondisplaced fracture of upper end of right humerus, subsequent encounter for fracture with routine healing: Secondary | ICD-10-CM | POA: Diagnosis not present

## 2020-03-25 DIAGNOSIS — N1832 Chronic kidney disease, stage 3b: Secondary | ICD-10-CM | POA: Diagnosis not present

## 2020-03-25 DIAGNOSIS — M6281 Muscle weakness (generalized): Secondary | ICD-10-CM | POA: Diagnosis not present

## 2020-03-25 DIAGNOSIS — R634 Abnormal weight loss: Secondary | ICD-10-CM | POA: Diagnosis not present

## 2020-03-25 DIAGNOSIS — E440 Moderate protein-calorie malnutrition: Secondary | ICD-10-CM | POA: Diagnosis not present

## 2020-03-25 DIAGNOSIS — G309 Alzheimer's disease, unspecified: Secondary | ICD-10-CM | POA: Diagnosis not present

## 2020-03-25 DIAGNOSIS — F0281 Dementia in other diseases classified elsewhere with behavioral disturbance: Secondary | ICD-10-CM | POA: Diagnosis not present

## 2020-03-25 DIAGNOSIS — I10 Essential (primary) hypertension: Secondary | ICD-10-CM | POA: Diagnosis not present

## 2020-03-26 DIAGNOSIS — R1312 Dysphagia, oropharyngeal phase: Secondary | ICD-10-CM | POA: Diagnosis not present

## 2020-03-26 DIAGNOSIS — E440 Moderate protein-calorie malnutrition: Secondary | ICD-10-CM | POA: Diagnosis not present

## 2020-03-26 DIAGNOSIS — N1832 Chronic kidney disease, stage 3b: Secondary | ICD-10-CM | POA: Diagnosis not present

## 2020-03-26 DIAGNOSIS — G309 Alzheimer's disease, unspecified: Secondary | ICD-10-CM | POA: Diagnosis not present

## 2020-03-26 DIAGNOSIS — F0281 Dementia in other diseases classified elsewhere with behavioral disturbance: Secondary | ICD-10-CM | POA: Diagnosis not present

## 2020-03-26 DIAGNOSIS — I10 Essential (primary) hypertension: Secondary | ICD-10-CM | POA: Diagnosis not present

## 2020-03-27 DIAGNOSIS — N1832 Chronic kidney disease, stage 3b: Secondary | ICD-10-CM | POA: Diagnosis not present

## 2020-03-27 DIAGNOSIS — I10 Essential (primary) hypertension: Secondary | ICD-10-CM | POA: Diagnosis not present

## 2020-03-27 DIAGNOSIS — G309 Alzheimer's disease, unspecified: Secondary | ICD-10-CM | POA: Diagnosis not present

## 2020-03-27 DIAGNOSIS — J156 Pneumonia due to other aerobic Gram-negative bacteria: Secondary | ICD-10-CM | POA: Diagnosis not present

## 2020-03-27 DIAGNOSIS — I214 Non-ST elevation (NSTEMI) myocardial infarction: Secondary | ICD-10-CM | POA: Diagnosis not present

## 2020-03-27 DIAGNOSIS — F0281 Dementia in other diseases classified elsewhere with behavioral disturbance: Secondary | ICD-10-CM | POA: Diagnosis not present

## 2020-03-27 DIAGNOSIS — E440 Moderate protein-calorie malnutrition: Secondary | ICD-10-CM | POA: Diagnosis not present

## 2020-03-27 DIAGNOSIS — R1312 Dysphagia, oropharyngeal phase: Secondary | ICD-10-CM | POA: Diagnosis not present

## 2020-03-27 DIAGNOSIS — N2889 Other specified disorders of kidney and ureter: Secondary | ICD-10-CM | POA: Diagnosis not present

## 2020-03-30 DIAGNOSIS — N1832 Chronic kidney disease, stage 3b: Secondary | ICD-10-CM | POA: Diagnosis not present

## 2020-03-30 DIAGNOSIS — M8000XS Age-related osteoporosis with current pathological fracture, unspecified site, sequela: Secondary | ICD-10-CM | POA: Diagnosis not present

## 2020-03-30 DIAGNOSIS — E440 Moderate protein-calorie malnutrition: Secondary | ICD-10-CM | POA: Diagnosis not present

## 2020-03-30 DIAGNOSIS — I10 Essential (primary) hypertension: Secondary | ICD-10-CM | POA: Diagnosis not present

## 2020-03-30 DIAGNOSIS — R1312 Dysphagia, oropharyngeal phase: Secondary | ICD-10-CM | POA: Diagnosis not present

## 2020-03-30 DIAGNOSIS — I151 Hypertension secondary to other renal disorders: Secondary | ICD-10-CM | POA: Diagnosis not present

## 2020-03-30 DIAGNOSIS — E039 Hypothyroidism, unspecified: Secondary | ICD-10-CM | POA: Diagnosis not present

## 2020-03-30 DIAGNOSIS — F0281 Dementia in other diseases classified elsewhere with behavioral disturbance: Secondary | ICD-10-CM | POA: Diagnosis not present

## 2020-03-30 DIAGNOSIS — Z03818 Encounter for observation for suspected exposure to other biological agents ruled out: Secondary | ICD-10-CM | POA: Diagnosis not present

## 2020-03-30 DIAGNOSIS — G309 Alzheimer's disease, unspecified: Secondary | ICD-10-CM | POA: Diagnosis not present

## 2020-03-31 DIAGNOSIS — F0281 Dementia in other diseases classified elsewhere with behavioral disturbance: Secondary | ICD-10-CM | POA: Diagnosis not present

## 2020-03-31 DIAGNOSIS — I10 Essential (primary) hypertension: Secondary | ICD-10-CM | POA: Diagnosis not present

## 2020-03-31 DIAGNOSIS — N1832 Chronic kidney disease, stage 3b: Secondary | ICD-10-CM | POA: Diagnosis not present

## 2020-03-31 DIAGNOSIS — R1312 Dysphagia, oropharyngeal phase: Secondary | ICD-10-CM | POA: Diagnosis not present

## 2020-03-31 DIAGNOSIS — G309 Alzheimer's disease, unspecified: Secondary | ICD-10-CM | POA: Diagnosis not present

## 2020-03-31 DIAGNOSIS — E440 Moderate protein-calorie malnutrition: Secondary | ICD-10-CM | POA: Diagnosis not present

## 2020-04-01 DIAGNOSIS — N1832 Chronic kidney disease, stage 3b: Secondary | ICD-10-CM | POA: Diagnosis not present

## 2020-04-01 DIAGNOSIS — R1312 Dysphagia, oropharyngeal phase: Secondary | ICD-10-CM | POA: Diagnosis not present

## 2020-04-01 DIAGNOSIS — D649 Anemia, unspecified: Secondary | ICD-10-CM | POA: Diagnosis not present

## 2020-04-01 DIAGNOSIS — E440 Moderate protein-calorie malnutrition: Secondary | ICD-10-CM | POA: Diagnosis not present

## 2020-04-01 DIAGNOSIS — I10 Essential (primary) hypertension: Secondary | ICD-10-CM | POA: Diagnosis not present

## 2020-04-01 DIAGNOSIS — E559 Vitamin D deficiency, unspecified: Secondary | ICD-10-CM | POA: Diagnosis not present

## 2020-04-01 DIAGNOSIS — F0281 Dementia in other diseases classified elsewhere with behavioral disturbance: Secondary | ICD-10-CM | POA: Diagnosis not present

## 2020-04-01 DIAGNOSIS — E039 Hypothyroidism, unspecified: Secondary | ICD-10-CM | POA: Diagnosis not present

## 2020-04-01 DIAGNOSIS — Z79899 Other long term (current) drug therapy: Secondary | ICD-10-CM | POA: Diagnosis not present

## 2020-04-01 DIAGNOSIS — G309 Alzheimer's disease, unspecified: Secondary | ICD-10-CM | POA: Diagnosis not present

## 2020-04-01 DIAGNOSIS — D519 Vitamin B12 deficiency anemia, unspecified: Secondary | ICD-10-CM | POA: Diagnosis not present

## 2020-04-02 DIAGNOSIS — R1312 Dysphagia, oropharyngeal phase: Secondary | ICD-10-CM | POA: Diagnosis not present

## 2020-04-02 DIAGNOSIS — N1832 Chronic kidney disease, stage 3b: Secondary | ICD-10-CM | POA: Diagnosis not present

## 2020-04-02 DIAGNOSIS — I10 Essential (primary) hypertension: Secondary | ICD-10-CM | POA: Diagnosis not present

## 2020-04-02 DIAGNOSIS — F0281 Dementia in other diseases classified elsewhere with behavioral disturbance: Secondary | ICD-10-CM | POA: Diagnosis not present

## 2020-04-02 DIAGNOSIS — E440 Moderate protein-calorie malnutrition: Secondary | ICD-10-CM | POA: Diagnosis not present

## 2020-04-02 DIAGNOSIS — G309 Alzheimer's disease, unspecified: Secondary | ICD-10-CM | POA: Diagnosis not present

## 2020-04-03 DIAGNOSIS — E440 Moderate protein-calorie malnutrition: Secondary | ICD-10-CM | POA: Diagnosis not present

## 2020-04-03 DIAGNOSIS — N1832 Chronic kidney disease, stage 3b: Secondary | ICD-10-CM | POA: Diagnosis not present

## 2020-04-03 DIAGNOSIS — G309 Alzheimer's disease, unspecified: Secondary | ICD-10-CM | POA: Diagnosis not present

## 2020-04-03 DIAGNOSIS — F0281 Dementia in other diseases classified elsewhere with behavioral disturbance: Secondary | ICD-10-CM | POA: Diagnosis not present

## 2020-04-03 DIAGNOSIS — I10 Essential (primary) hypertension: Secondary | ICD-10-CM | POA: Diagnosis not present

## 2020-04-03 DIAGNOSIS — R1312 Dysphagia, oropharyngeal phase: Secondary | ICD-10-CM | POA: Diagnosis not present

## 2020-04-07 DIAGNOSIS — R1312 Dysphagia, oropharyngeal phase: Secondary | ICD-10-CM | POA: Diagnosis not present

## 2020-04-07 DIAGNOSIS — G309 Alzheimer's disease, unspecified: Secondary | ICD-10-CM | POA: Diagnosis not present

## 2020-04-07 DIAGNOSIS — I10 Essential (primary) hypertension: Secondary | ICD-10-CM | POA: Diagnosis not present

## 2020-04-07 DIAGNOSIS — E440 Moderate protein-calorie malnutrition: Secondary | ICD-10-CM | POA: Diagnosis not present

## 2020-04-07 DIAGNOSIS — N1832 Chronic kidney disease, stage 3b: Secondary | ICD-10-CM | POA: Diagnosis not present

## 2020-04-07 DIAGNOSIS — F0281 Dementia in other diseases classified elsewhere with behavioral disturbance: Secondary | ICD-10-CM | POA: Diagnosis not present

## 2020-04-08 DIAGNOSIS — Z03818 Encounter for observation for suspected exposure to other biological agents ruled out: Secondary | ICD-10-CM | POA: Diagnosis not present

## 2020-04-27 DIAGNOSIS — E440 Moderate protein-calorie malnutrition: Secondary | ICD-10-CM | POA: Diagnosis not present

## 2020-04-27 DIAGNOSIS — G301 Alzheimer's disease with late onset: Secondary | ICD-10-CM | POA: Diagnosis not present

## 2020-04-27 DIAGNOSIS — E039 Hypothyroidism, unspecified: Secondary | ICD-10-CM | POA: Diagnosis not present

## 2020-04-27 DIAGNOSIS — I151 Hypertension secondary to other renal disorders: Secondary | ICD-10-CM | POA: Diagnosis not present

## 2020-04-28 DIAGNOSIS — Z20822 Contact with and (suspected) exposure to covid-19: Secondary | ICD-10-CM | POA: Diagnosis not present

## 2020-05-02 DIAGNOSIS — E039 Hypothyroidism, unspecified: Secondary | ICD-10-CM | POA: Diagnosis not present

## 2020-05-02 DIAGNOSIS — D649 Anemia, unspecified: Secondary | ICD-10-CM | POA: Diagnosis not present

## 2020-05-02 DIAGNOSIS — E559 Vitamin D deficiency, unspecified: Secondary | ICD-10-CM | POA: Diagnosis not present

## 2020-05-02 DIAGNOSIS — D519 Vitamin B12 deficiency anemia, unspecified: Secondary | ICD-10-CM | POA: Diagnosis not present

## 2020-05-02 DIAGNOSIS — Z79899 Other long term (current) drug therapy: Secondary | ICD-10-CM | POA: Diagnosis not present

## 2020-05-04 DIAGNOSIS — Z20822 Contact with and (suspected) exposure to covid-19: Secondary | ICD-10-CM | POA: Diagnosis not present

## 2020-05-08 DIAGNOSIS — E039 Hypothyroidism, unspecified: Secondary | ICD-10-CM | POA: Diagnosis not present

## 2020-05-08 DIAGNOSIS — I10 Essential (primary) hypertension: Secondary | ICD-10-CM | POA: Diagnosis not present

## 2020-05-08 DIAGNOSIS — R1312 Dysphagia, oropharyngeal phase: Secondary | ICD-10-CM | POA: Diagnosis not present

## 2020-05-08 DIAGNOSIS — I151 Hypertension secondary to other renal disorders: Secondary | ICD-10-CM | POA: Diagnosis not present

## 2020-05-21 DIAGNOSIS — N1832 Chronic kidney disease, stage 3b: Secondary | ICD-10-CM | POA: Diagnosis not present

## 2020-05-21 DIAGNOSIS — F0281 Dementia in other diseases classified elsewhere with behavioral disturbance: Secondary | ICD-10-CM | POA: Diagnosis not present

## 2020-05-21 DIAGNOSIS — E039 Hypothyroidism, unspecified: Secondary | ICD-10-CM | POA: Diagnosis not present

## 2020-05-21 DIAGNOSIS — S52124S Nondisplaced fracture of head of right radius, sequela: Secondary | ICD-10-CM | POA: Diagnosis not present

## 2020-05-21 DIAGNOSIS — Z741 Need for assistance with personal care: Secondary | ICD-10-CM | POA: Diagnosis not present

## 2020-05-21 DIAGNOSIS — S42294D Other nondisplaced fracture of upper end of right humerus, subsequent encounter for fracture with routine healing: Secondary | ICD-10-CM | POA: Diagnosis not present

## 2020-05-21 DIAGNOSIS — S62102S Fracture of unspecified carpal bone, left wrist, sequela: Secondary | ICD-10-CM | POA: Diagnosis not present

## 2020-05-21 DIAGNOSIS — R55 Syncope and collapse: Secondary | ICD-10-CM | POA: Diagnosis not present

## 2020-05-21 DIAGNOSIS — E440 Moderate protein-calorie malnutrition: Secondary | ICD-10-CM | POA: Diagnosis not present

## 2020-05-21 DIAGNOSIS — M24561 Contracture, right knee: Secondary | ICD-10-CM | POA: Diagnosis not present

## 2020-05-21 DIAGNOSIS — R278 Other lack of coordination: Secondary | ICD-10-CM | POA: Diagnosis not present

## 2020-05-21 DIAGNOSIS — S52125D Nondisplaced fracture of head of left radius, subsequent encounter for closed fracture with routine healing: Secondary | ICD-10-CM | POA: Diagnosis not present

## 2020-05-21 DIAGNOSIS — F339 Major depressive disorder, recurrent, unspecified: Secondary | ICD-10-CM | POA: Diagnosis not present

## 2020-05-21 DIAGNOSIS — M6281 Muscle weakness (generalized): Secondary | ICD-10-CM | POA: Diagnosis not present

## 2020-05-21 DIAGNOSIS — R634 Abnormal weight loss: Secondary | ICD-10-CM | POA: Diagnosis not present

## 2020-05-21 DIAGNOSIS — M62461 Contracture of muscle, right lower leg: Secondary | ICD-10-CM | POA: Diagnosis not present

## 2020-05-21 DIAGNOSIS — R1312 Dysphagia, oropharyngeal phase: Secondary | ICD-10-CM | POA: Diagnosis not present

## 2020-05-21 DIAGNOSIS — I214 Non-ST elevation (NSTEMI) myocardial infarction: Secondary | ICD-10-CM | POA: Diagnosis not present

## 2020-05-21 DIAGNOSIS — I10 Essential (primary) hypertension: Secondary | ICD-10-CM | POA: Diagnosis not present

## 2020-05-21 DIAGNOSIS — G309 Alzheimer's disease, unspecified: Secondary | ICD-10-CM | POA: Diagnosis not present

## 2020-05-22 DIAGNOSIS — I10 Essential (primary) hypertension: Secondary | ICD-10-CM | POA: Diagnosis not present

## 2020-05-22 DIAGNOSIS — R1312 Dysphagia, oropharyngeal phase: Secondary | ICD-10-CM | POA: Diagnosis not present

## 2020-05-22 DIAGNOSIS — E440 Moderate protein-calorie malnutrition: Secondary | ICD-10-CM | POA: Diagnosis not present

## 2020-05-22 DIAGNOSIS — G309 Alzheimer's disease, unspecified: Secondary | ICD-10-CM | POA: Diagnosis not present

## 2020-05-22 DIAGNOSIS — N1832 Chronic kidney disease, stage 3b: Secondary | ICD-10-CM | POA: Diagnosis not present

## 2020-05-22 DIAGNOSIS — F0281 Dementia in other diseases classified elsewhere with behavioral disturbance: Secondary | ICD-10-CM | POA: Diagnosis not present

## 2020-05-25 DIAGNOSIS — I10 Essential (primary) hypertension: Secondary | ICD-10-CM | POA: Diagnosis not present

## 2020-05-25 DIAGNOSIS — G309 Alzheimer's disease, unspecified: Secondary | ICD-10-CM | POA: Diagnosis not present

## 2020-05-25 DIAGNOSIS — E440 Moderate protein-calorie malnutrition: Secondary | ICD-10-CM | POA: Diagnosis not present

## 2020-05-25 DIAGNOSIS — N1832 Chronic kidney disease, stage 3b: Secondary | ICD-10-CM | POA: Diagnosis not present

## 2020-05-25 DIAGNOSIS — R1312 Dysphagia, oropharyngeal phase: Secondary | ICD-10-CM | POA: Diagnosis not present

## 2020-05-25 DIAGNOSIS — F0281 Dementia in other diseases classified elsewhere with behavioral disturbance: Secondary | ICD-10-CM | POA: Diagnosis not present

## 2020-05-26 DIAGNOSIS — R1312 Dysphagia, oropharyngeal phase: Secondary | ICD-10-CM | POA: Diagnosis not present

## 2020-05-26 DIAGNOSIS — I10 Essential (primary) hypertension: Secondary | ICD-10-CM | POA: Diagnosis not present

## 2020-05-26 DIAGNOSIS — E440 Moderate protein-calorie malnutrition: Secondary | ICD-10-CM | POA: Diagnosis not present

## 2020-05-26 DIAGNOSIS — G309 Alzheimer's disease, unspecified: Secondary | ICD-10-CM | POA: Diagnosis not present

## 2020-05-26 DIAGNOSIS — N1832 Chronic kidney disease, stage 3b: Secondary | ICD-10-CM | POA: Diagnosis not present

## 2020-05-26 DIAGNOSIS — F0281 Dementia in other diseases classified elsewhere with behavioral disturbance: Secondary | ICD-10-CM | POA: Diagnosis not present

## 2020-05-27 DIAGNOSIS — E440 Moderate protein-calorie malnutrition: Secondary | ICD-10-CM | POA: Diagnosis not present

## 2020-05-27 DIAGNOSIS — I10 Essential (primary) hypertension: Secondary | ICD-10-CM | POA: Diagnosis not present

## 2020-05-27 DIAGNOSIS — N1832 Chronic kidney disease, stage 3b: Secondary | ICD-10-CM | POA: Diagnosis not present

## 2020-05-27 DIAGNOSIS — R1312 Dysphagia, oropharyngeal phase: Secondary | ICD-10-CM | POA: Diagnosis not present

## 2020-05-27 DIAGNOSIS — F0281 Dementia in other diseases classified elsewhere with behavioral disturbance: Secondary | ICD-10-CM | POA: Diagnosis not present

## 2020-05-27 DIAGNOSIS — G309 Alzheimer's disease, unspecified: Secondary | ICD-10-CM | POA: Diagnosis not present

## 2020-05-28 DIAGNOSIS — N1832 Chronic kidney disease, stage 3b: Secondary | ICD-10-CM | POA: Diagnosis not present

## 2020-05-28 DIAGNOSIS — I10 Essential (primary) hypertension: Secondary | ICD-10-CM | POA: Diagnosis not present

## 2020-05-28 DIAGNOSIS — R1312 Dysphagia, oropharyngeal phase: Secondary | ICD-10-CM | POA: Diagnosis not present

## 2020-05-28 DIAGNOSIS — F0281 Dementia in other diseases classified elsewhere with behavioral disturbance: Secondary | ICD-10-CM | POA: Diagnosis not present

## 2020-05-28 DIAGNOSIS — G309 Alzheimer's disease, unspecified: Secondary | ICD-10-CM | POA: Diagnosis not present

## 2020-05-28 DIAGNOSIS — E440 Moderate protein-calorie malnutrition: Secondary | ICD-10-CM | POA: Diagnosis not present

## 2020-05-29 DIAGNOSIS — I10 Essential (primary) hypertension: Secondary | ICD-10-CM | POA: Diagnosis not present

## 2020-05-29 DIAGNOSIS — N1832 Chronic kidney disease, stage 3b: Secondary | ICD-10-CM | POA: Diagnosis not present

## 2020-05-29 DIAGNOSIS — E440 Moderate protein-calorie malnutrition: Secondary | ICD-10-CM | POA: Diagnosis not present

## 2020-05-29 DIAGNOSIS — R1312 Dysphagia, oropharyngeal phase: Secondary | ICD-10-CM | POA: Diagnosis not present

## 2020-05-29 DIAGNOSIS — F0281 Dementia in other diseases classified elsewhere with behavioral disturbance: Secondary | ICD-10-CM | POA: Diagnosis not present

## 2020-05-29 DIAGNOSIS — G309 Alzheimer's disease, unspecified: Secondary | ICD-10-CM | POA: Diagnosis not present

## 2020-06-01 DIAGNOSIS — R1312 Dysphagia, oropharyngeal phase: Secondary | ICD-10-CM | POA: Diagnosis not present

## 2020-06-01 DIAGNOSIS — G309 Alzheimer's disease, unspecified: Secondary | ICD-10-CM | POA: Diagnosis not present

## 2020-06-01 DIAGNOSIS — F0281 Dementia in other diseases classified elsewhere with behavioral disturbance: Secondary | ICD-10-CM | POA: Diagnosis not present

## 2020-06-01 DIAGNOSIS — E440 Moderate protein-calorie malnutrition: Secondary | ICD-10-CM | POA: Diagnosis not present

## 2020-06-01 DIAGNOSIS — N1832 Chronic kidney disease, stage 3b: Secondary | ICD-10-CM | POA: Diagnosis not present

## 2020-06-01 DIAGNOSIS — I10 Essential (primary) hypertension: Secondary | ICD-10-CM | POA: Diagnosis not present

## 2020-06-02 DIAGNOSIS — F0281 Dementia in other diseases classified elsewhere with behavioral disturbance: Secondary | ICD-10-CM | POA: Diagnosis not present

## 2020-06-02 DIAGNOSIS — G309 Alzheimer's disease, unspecified: Secondary | ICD-10-CM | POA: Diagnosis not present

## 2020-06-02 DIAGNOSIS — E440 Moderate protein-calorie malnutrition: Secondary | ICD-10-CM | POA: Diagnosis not present

## 2020-06-02 DIAGNOSIS — R1312 Dysphagia, oropharyngeal phase: Secondary | ICD-10-CM | POA: Diagnosis not present

## 2020-06-02 DIAGNOSIS — N1832 Chronic kidney disease, stage 3b: Secondary | ICD-10-CM | POA: Diagnosis not present

## 2020-06-02 DIAGNOSIS — I10 Essential (primary) hypertension: Secondary | ICD-10-CM | POA: Diagnosis not present

## 2020-06-03 DIAGNOSIS — I10 Essential (primary) hypertension: Secondary | ICD-10-CM | POA: Diagnosis not present

## 2020-06-03 DIAGNOSIS — E440 Moderate protein-calorie malnutrition: Secondary | ICD-10-CM | POA: Diagnosis not present

## 2020-06-03 DIAGNOSIS — G309 Alzheimer's disease, unspecified: Secondary | ICD-10-CM | POA: Diagnosis not present

## 2020-06-03 DIAGNOSIS — N1832 Chronic kidney disease, stage 3b: Secondary | ICD-10-CM | POA: Diagnosis not present

## 2020-06-03 DIAGNOSIS — R1312 Dysphagia, oropharyngeal phase: Secondary | ICD-10-CM | POA: Diagnosis not present

## 2020-06-03 DIAGNOSIS — F0281 Dementia in other diseases classified elsewhere with behavioral disturbance: Secondary | ICD-10-CM | POA: Diagnosis not present

## 2020-06-04 DIAGNOSIS — G309 Alzheimer's disease, unspecified: Secondary | ICD-10-CM | POA: Diagnosis not present

## 2020-06-04 DIAGNOSIS — N1832 Chronic kidney disease, stage 3b: Secondary | ICD-10-CM | POA: Diagnosis not present

## 2020-06-04 DIAGNOSIS — F0281 Dementia in other diseases classified elsewhere with behavioral disturbance: Secondary | ICD-10-CM | POA: Diagnosis not present

## 2020-06-04 DIAGNOSIS — I10 Essential (primary) hypertension: Secondary | ICD-10-CM | POA: Diagnosis not present

## 2020-06-04 DIAGNOSIS — R1312 Dysphagia, oropharyngeal phase: Secondary | ICD-10-CM | POA: Diagnosis not present

## 2020-06-04 DIAGNOSIS — E440 Moderate protein-calorie malnutrition: Secondary | ICD-10-CM | POA: Diagnosis not present

## 2020-06-06 DIAGNOSIS — G309 Alzheimer's disease, unspecified: Secondary | ICD-10-CM | POA: Diagnosis not present

## 2020-06-06 DIAGNOSIS — F0281 Dementia in other diseases classified elsewhere with behavioral disturbance: Secondary | ICD-10-CM | POA: Diagnosis not present

## 2020-06-06 DIAGNOSIS — I10 Essential (primary) hypertension: Secondary | ICD-10-CM | POA: Diagnosis not present

## 2020-06-06 DIAGNOSIS — E440 Moderate protein-calorie malnutrition: Secondary | ICD-10-CM | POA: Diagnosis not present

## 2020-06-06 DIAGNOSIS — R1312 Dysphagia, oropharyngeal phase: Secondary | ICD-10-CM | POA: Diagnosis not present

## 2020-06-06 DIAGNOSIS — N1832 Chronic kidney disease, stage 3b: Secondary | ICD-10-CM | POA: Diagnosis not present

## 2020-06-07 DIAGNOSIS — I10 Essential (primary) hypertension: Secondary | ICD-10-CM | POA: Diagnosis not present

## 2020-06-07 DIAGNOSIS — R1312 Dysphagia, oropharyngeal phase: Secondary | ICD-10-CM | POA: Diagnosis not present

## 2020-06-07 DIAGNOSIS — F0281 Dementia in other diseases classified elsewhere with behavioral disturbance: Secondary | ICD-10-CM | POA: Diagnosis not present

## 2020-06-07 DIAGNOSIS — N1832 Chronic kidney disease, stage 3b: Secondary | ICD-10-CM | POA: Diagnosis not present

## 2020-06-07 DIAGNOSIS — G309 Alzheimer's disease, unspecified: Secondary | ICD-10-CM | POA: Diagnosis not present

## 2020-06-07 DIAGNOSIS — E440 Moderate protein-calorie malnutrition: Secondary | ICD-10-CM | POA: Diagnosis not present

## 2020-06-08 DIAGNOSIS — E039 Hypothyroidism, unspecified: Secondary | ICD-10-CM | POA: Diagnosis not present

## 2020-06-08 DIAGNOSIS — I214 Non-ST elevation (NSTEMI) myocardial infarction: Secondary | ICD-10-CM | POA: Diagnosis not present

## 2020-06-08 DIAGNOSIS — I10 Essential (primary) hypertension: Secondary | ICD-10-CM | POA: Diagnosis not present

## 2020-06-08 DIAGNOSIS — M8000XS Age-related osteoporosis with current pathological fracture, unspecified site, sequela: Secondary | ICD-10-CM | POA: Diagnosis not present

## 2020-06-09 DIAGNOSIS — E440 Moderate protein-calorie malnutrition: Secondary | ICD-10-CM | POA: Diagnosis not present

## 2020-06-09 DIAGNOSIS — F0281 Dementia in other diseases classified elsewhere with behavioral disturbance: Secondary | ICD-10-CM | POA: Diagnosis not present

## 2020-06-09 DIAGNOSIS — N1832 Chronic kidney disease, stage 3b: Secondary | ICD-10-CM | POA: Diagnosis not present

## 2020-06-09 DIAGNOSIS — G309 Alzheimer's disease, unspecified: Secondary | ICD-10-CM | POA: Diagnosis not present

## 2020-06-09 DIAGNOSIS — R1312 Dysphagia, oropharyngeal phase: Secondary | ICD-10-CM | POA: Diagnosis not present

## 2020-06-09 DIAGNOSIS — I10 Essential (primary) hypertension: Secondary | ICD-10-CM | POA: Diagnosis not present

## 2020-06-10 DIAGNOSIS — E440 Moderate protein-calorie malnutrition: Secondary | ICD-10-CM | POA: Diagnosis not present

## 2020-06-10 DIAGNOSIS — I10 Essential (primary) hypertension: Secondary | ICD-10-CM | POA: Diagnosis not present

## 2020-06-10 DIAGNOSIS — G309 Alzheimer's disease, unspecified: Secondary | ICD-10-CM | POA: Diagnosis not present

## 2020-06-10 DIAGNOSIS — N1832 Chronic kidney disease, stage 3b: Secondary | ICD-10-CM | POA: Diagnosis not present

## 2020-06-10 DIAGNOSIS — F0281 Dementia in other diseases classified elsewhere with behavioral disturbance: Secondary | ICD-10-CM | POA: Diagnosis not present

## 2020-06-10 DIAGNOSIS — R1312 Dysphagia, oropharyngeal phase: Secondary | ICD-10-CM | POA: Diagnosis not present

## 2020-06-11 DIAGNOSIS — F0281 Dementia in other diseases classified elsewhere with behavioral disturbance: Secondary | ICD-10-CM | POA: Diagnosis not present

## 2020-06-11 DIAGNOSIS — E440 Moderate protein-calorie malnutrition: Secondary | ICD-10-CM | POA: Diagnosis not present

## 2020-06-11 DIAGNOSIS — G309 Alzheimer's disease, unspecified: Secondary | ICD-10-CM | POA: Diagnosis not present

## 2020-06-11 DIAGNOSIS — R1312 Dysphagia, oropharyngeal phase: Secondary | ICD-10-CM | POA: Diagnosis not present

## 2020-06-11 DIAGNOSIS — I10 Essential (primary) hypertension: Secondary | ICD-10-CM | POA: Diagnosis not present

## 2020-06-11 DIAGNOSIS — N1832 Chronic kidney disease, stage 3b: Secondary | ICD-10-CM | POA: Diagnosis not present

## 2020-06-12 DIAGNOSIS — G309 Alzheimer's disease, unspecified: Secondary | ICD-10-CM | POA: Diagnosis not present

## 2020-06-12 DIAGNOSIS — E440 Moderate protein-calorie malnutrition: Secondary | ICD-10-CM | POA: Diagnosis not present

## 2020-06-12 DIAGNOSIS — R1312 Dysphagia, oropharyngeal phase: Secondary | ICD-10-CM | POA: Diagnosis not present

## 2020-06-12 DIAGNOSIS — F0281 Dementia in other diseases classified elsewhere with behavioral disturbance: Secondary | ICD-10-CM | POA: Diagnosis not present

## 2020-06-12 DIAGNOSIS — I10 Essential (primary) hypertension: Secondary | ICD-10-CM | POA: Diagnosis not present

## 2020-06-12 DIAGNOSIS — N1832 Chronic kidney disease, stage 3b: Secondary | ICD-10-CM | POA: Diagnosis not present

## 2020-06-14 DIAGNOSIS — E440 Moderate protein-calorie malnutrition: Secondary | ICD-10-CM | POA: Diagnosis not present

## 2020-06-14 DIAGNOSIS — R1312 Dysphagia, oropharyngeal phase: Secondary | ICD-10-CM | POA: Diagnosis not present

## 2020-06-14 DIAGNOSIS — G309 Alzheimer's disease, unspecified: Secondary | ICD-10-CM | POA: Diagnosis not present

## 2020-06-14 DIAGNOSIS — N1832 Chronic kidney disease, stage 3b: Secondary | ICD-10-CM | POA: Diagnosis not present

## 2020-06-14 DIAGNOSIS — I10 Essential (primary) hypertension: Secondary | ICD-10-CM | POA: Diagnosis not present

## 2020-06-14 DIAGNOSIS — F0281 Dementia in other diseases classified elsewhere with behavioral disturbance: Secondary | ICD-10-CM | POA: Diagnosis not present

## 2020-06-16 DIAGNOSIS — N1832 Chronic kidney disease, stage 3b: Secondary | ICD-10-CM | POA: Diagnosis not present

## 2020-06-16 DIAGNOSIS — G309 Alzheimer's disease, unspecified: Secondary | ICD-10-CM | POA: Diagnosis not present

## 2020-06-16 DIAGNOSIS — E440 Moderate protein-calorie malnutrition: Secondary | ICD-10-CM | POA: Diagnosis not present

## 2020-06-16 DIAGNOSIS — I10 Essential (primary) hypertension: Secondary | ICD-10-CM | POA: Diagnosis not present

## 2020-06-16 DIAGNOSIS — R1312 Dysphagia, oropharyngeal phase: Secondary | ICD-10-CM | POA: Diagnosis not present

## 2020-06-16 DIAGNOSIS — F0281 Dementia in other diseases classified elsewhere with behavioral disturbance: Secondary | ICD-10-CM | POA: Diagnosis not present

## 2020-06-17 DIAGNOSIS — I10 Essential (primary) hypertension: Secondary | ICD-10-CM | POA: Diagnosis not present

## 2020-06-17 DIAGNOSIS — F0281 Dementia in other diseases classified elsewhere with behavioral disturbance: Secondary | ICD-10-CM | POA: Diagnosis not present

## 2020-06-17 DIAGNOSIS — E440 Moderate protein-calorie malnutrition: Secondary | ICD-10-CM | POA: Diagnosis not present

## 2020-06-17 DIAGNOSIS — Z1152 Encounter for screening for COVID-19: Secondary | ICD-10-CM | POA: Diagnosis not present

## 2020-06-17 DIAGNOSIS — R1312 Dysphagia, oropharyngeal phase: Secondary | ICD-10-CM | POA: Diagnosis not present

## 2020-06-17 DIAGNOSIS — N1832 Chronic kidney disease, stage 3b: Secondary | ICD-10-CM | POA: Diagnosis not present

## 2020-06-17 DIAGNOSIS — G309 Alzheimer's disease, unspecified: Secondary | ICD-10-CM | POA: Diagnosis not present

## 2020-06-18 DIAGNOSIS — F0281 Dementia in other diseases classified elsewhere with behavioral disturbance: Secondary | ICD-10-CM | POA: Diagnosis not present

## 2020-06-18 DIAGNOSIS — I10 Essential (primary) hypertension: Secondary | ICD-10-CM | POA: Diagnosis not present

## 2020-06-18 DIAGNOSIS — N1832 Chronic kidney disease, stage 3b: Secondary | ICD-10-CM | POA: Diagnosis not present

## 2020-06-18 DIAGNOSIS — E440 Moderate protein-calorie malnutrition: Secondary | ICD-10-CM | POA: Diagnosis not present

## 2020-06-18 DIAGNOSIS — R1312 Dysphagia, oropharyngeal phase: Secondary | ICD-10-CM | POA: Diagnosis not present

## 2020-06-18 DIAGNOSIS — G309 Alzheimer's disease, unspecified: Secondary | ICD-10-CM | POA: Diagnosis not present

## 2020-06-19 DIAGNOSIS — N1832 Chronic kidney disease, stage 3b: Secondary | ICD-10-CM | POA: Diagnosis not present

## 2020-06-19 DIAGNOSIS — E440 Moderate protein-calorie malnutrition: Secondary | ICD-10-CM | POA: Diagnosis not present

## 2020-06-19 DIAGNOSIS — G309 Alzheimer's disease, unspecified: Secondary | ICD-10-CM | POA: Diagnosis not present

## 2020-06-19 DIAGNOSIS — F0281 Dementia in other diseases classified elsewhere with behavioral disturbance: Secondary | ICD-10-CM | POA: Diagnosis not present

## 2020-06-19 DIAGNOSIS — I10 Essential (primary) hypertension: Secondary | ICD-10-CM | POA: Diagnosis not present

## 2020-06-19 DIAGNOSIS — R1312 Dysphagia, oropharyngeal phase: Secondary | ICD-10-CM | POA: Diagnosis not present

## 2020-06-22 DIAGNOSIS — Z03818 Encounter for observation for suspected exposure to other biological agents ruled out: Secondary | ICD-10-CM | POA: Diagnosis not present

## 2020-06-29 DIAGNOSIS — E440 Moderate protein-calorie malnutrition: Secondary | ICD-10-CM | POA: Diagnosis not present

## 2020-06-29 DIAGNOSIS — Z03818 Encounter for observation for suspected exposure to other biological agents ruled out: Secondary | ICD-10-CM | POA: Diagnosis not present

## 2020-06-29 DIAGNOSIS — E039 Hypothyroidism, unspecified: Secondary | ICD-10-CM | POA: Diagnosis not present

## 2020-06-30 DIAGNOSIS — E559 Vitamin D deficiency, unspecified: Secondary | ICD-10-CM | POA: Diagnosis not present

## 2020-06-30 DIAGNOSIS — E039 Hypothyroidism, unspecified: Secondary | ICD-10-CM | POA: Diagnosis not present

## 2020-06-30 DIAGNOSIS — I10 Essential (primary) hypertension: Secondary | ICD-10-CM | POA: Diagnosis not present

## 2020-06-30 DIAGNOSIS — E119 Type 2 diabetes mellitus without complications: Secondary | ICD-10-CM | POA: Diagnosis not present

## 2020-06-30 DIAGNOSIS — D649 Anemia, unspecified: Secondary | ICD-10-CM | POA: Diagnosis not present

## 2020-07-06 DIAGNOSIS — Z03818 Encounter for observation for suspected exposure to other biological agents ruled out: Secondary | ICD-10-CM | POA: Diagnosis not present

## 2020-07-13 DIAGNOSIS — Z03818 Encounter for observation for suspected exposure to other biological agents ruled out: Secondary | ICD-10-CM | POA: Diagnosis not present

## 2020-07-17 DIAGNOSIS — E440 Moderate protein-calorie malnutrition: Secondary | ICD-10-CM | POA: Diagnosis not present

## 2020-07-17 DIAGNOSIS — M8000XS Age-related osteoporosis with current pathological fracture, unspecified site, sequela: Secondary | ICD-10-CM | POA: Diagnosis not present

## 2020-07-17 DIAGNOSIS — I10 Essential (primary) hypertension: Secondary | ICD-10-CM | POA: Diagnosis not present

## 2020-07-17 DIAGNOSIS — E039 Hypothyroidism, unspecified: Secondary | ICD-10-CM | POA: Diagnosis not present

## 2020-07-20 DIAGNOSIS — Z03818 Encounter for observation for suspected exposure to other biological agents ruled out: Secondary | ICD-10-CM | POA: Diagnosis not present

## 2020-07-24 DIAGNOSIS — E039 Hypothyroidism, unspecified: Secondary | ICD-10-CM | POA: Diagnosis not present

## 2020-07-24 DIAGNOSIS — I151 Hypertension secondary to other renal disorders: Secondary | ICD-10-CM | POA: Diagnosis not present

## 2020-07-24 DIAGNOSIS — M8000XS Age-related osteoporosis with current pathological fracture, unspecified site, sequela: Secondary | ICD-10-CM | POA: Diagnosis not present

## 2020-07-24 DIAGNOSIS — G301 Alzheimer's disease with late onset: Secondary | ICD-10-CM | POA: Diagnosis not present

## 2020-07-24 DIAGNOSIS — E440 Moderate protein-calorie malnutrition: Secondary | ICD-10-CM | POA: Diagnosis not present

## 2020-07-28 DIAGNOSIS — Z03818 Encounter for observation for suspected exposure to other biological agents ruled out: Secondary | ICD-10-CM | POA: Diagnosis not present

## 2020-08-03 DIAGNOSIS — Z03818 Encounter for observation for suspected exposure to other biological agents ruled out: Secondary | ICD-10-CM | POA: Diagnosis not present

## 2020-08-10 DIAGNOSIS — Z03818 Encounter for observation for suspected exposure to other biological agents ruled out: Secondary | ICD-10-CM | POA: Diagnosis not present

## 2020-08-17 DIAGNOSIS — Z03818 Encounter for observation for suspected exposure to other biological agents ruled out: Secondary | ICD-10-CM | POA: Diagnosis not present

## 2020-08-18 DIAGNOSIS — E039 Hypothyroidism, unspecified: Secondary | ICD-10-CM | POA: Diagnosis not present

## 2020-08-18 DIAGNOSIS — E440 Moderate protein-calorie malnutrition: Secondary | ICD-10-CM | POA: Diagnosis not present

## 2020-08-18 DIAGNOSIS — M8000XS Age-related osteoporosis with current pathological fracture, unspecified site, sequela: Secondary | ICD-10-CM | POA: Diagnosis not present

## 2020-08-18 DIAGNOSIS — I10 Essential (primary) hypertension: Secondary | ICD-10-CM | POA: Diagnosis not present

## 2020-08-21 DIAGNOSIS — M8000XS Age-related osteoporosis with current pathological fracture, unspecified site, sequela: Secondary | ICD-10-CM | POA: Diagnosis not present

## 2020-08-21 DIAGNOSIS — E440 Moderate protein-calorie malnutrition: Secondary | ICD-10-CM | POA: Diagnosis not present

## 2020-08-21 DIAGNOSIS — N1832 Chronic kidney disease, stage 3b: Secondary | ICD-10-CM | POA: Diagnosis not present

## 2020-08-21 DIAGNOSIS — E039 Hypothyroidism, unspecified: Secondary | ICD-10-CM | POA: Diagnosis not present

## 2020-08-24 DIAGNOSIS — R4189 Other symptoms and signs involving cognitive functions and awareness: Secondary | ICD-10-CM | POA: Diagnosis not present

## 2020-08-25 DIAGNOSIS — N390 Urinary tract infection, site not specified: Secondary | ICD-10-CM | POA: Diagnosis not present

## 2020-08-25 DIAGNOSIS — R5383 Other fatigue: Secondary | ICD-10-CM | POA: Diagnosis not present

## 2020-08-25 DIAGNOSIS — Z79899 Other long term (current) drug therapy: Secondary | ICD-10-CM | POA: Diagnosis not present

## 2020-09-22 DIAGNOSIS — M2142 Flat foot [pes planus] (acquired), left foot: Secondary | ICD-10-CM | POA: Diagnosis not present

## 2020-09-22 DIAGNOSIS — M2141 Flat foot [pes planus] (acquired), right foot: Secondary | ICD-10-CM | POA: Diagnosis not present

## 2020-09-22 DIAGNOSIS — B351 Tinea unguium: Secondary | ICD-10-CM | POA: Diagnosis not present

## 2020-09-22 DIAGNOSIS — L603 Nail dystrophy: Secondary | ICD-10-CM | POA: Diagnosis not present

## 2020-09-22 DIAGNOSIS — I739 Peripheral vascular disease, unspecified: Secondary | ICD-10-CM | POA: Diagnosis not present

## 2020-10-01 DIAGNOSIS — M6281 Muscle weakness (generalized): Secondary | ICD-10-CM | POA: Diagnosis not present

## 2020-10-01 DIAGNOSIS — E039 Hypothyroidism, unspecified: Secondary | ICD-10-CM | POA: Diagnosis not present

## 2020-10-01 DIAGNOSIS — R1310 Dysphagia, unspecified: Secondary | ICD-10-CM | POA: Diagnosis not present

## 2020-10-01 DIAGNOSIS — K5900 Constipation, unspecified: Secondary | ICD-10-CM | POA: Diagnosis not present

## 2020-10-01 DIAGNOSIS — M810 Age-related osteoporosis without current pathological fracture: Secondary | ICD-10-CM | POA: Diagnosis not present

## 2020-10-01 DIAGNOSIS — I10 Essential (primary) hypertension: Secondary | ICD-10-CM | POA: Diagnosis not present

## 2020-10-01 DIAGNOSIS — I2510 Atherosclerotic heart disease of native coronary artery without angina pectoris: Secondary | ICD-10-CM | POA: Diagnosis not present

## 2020-10-01 DIAGNOSIS — G309 Alzheimer's disease, unspecified: Secondary | ICD-10-CM | POA: Diagnosis not present

## 2020-10-07 DIAGNOSIS — L8996 Pressure-induced deep tissue damage of unspecified site: Secondary | ICD-10-CM | POA: Diagnosis not present

## 2020-10-07 DIAGNOSIS — M6281 Muscle weakness (generalized): Secondary | ICD-10-CM | POA: Diagnosis not present

## 2020-10-14 DIAGNOSIS — M810 Age-related osteoporosis without current pathological fracture: Secondary | ICD-10-CM | POA: Diagnosis not present

## 2020-10-14 DIAGNOSIS — M6281 Muscle weakness (generalized): Secondary | ICD-10-CM | POA: Diagnosis not present

## 2020-10-14 DIAGNOSIS — L8996 Pressure-induced deep tissue damage of unspecified site: Secondary | ICD-10-CM | POA: Diagnosis not present

## 2020-10-21 DIAGNOSIS — M6281 Muscle weakness (generalized): Secondary | ICD-10-CM | POA: Diagnosis not present

## 2020-10-21 DIAGNOSIS — M810 Age-related osteoporosis without current pathological fracture: Secondary | ICD-10-CM | POA: Diagnosis not present

## 2020-10-21 DIAGNOSIS — L8996 Pressure-induced deep tissue damage of unspecified site: Secondary | ICD-10-CM | POA: Diagnosis not present

## 2020-10-28 DIAGNOSIS — L8996 Pressure-induced deep tissue damage of unspecified site: Secondary | ICD-10-CM | POA: Diagnosis not present

## 2020-10-28 DIAGNOSIS — M6281 Muscle weakness (generalized): Secondary | ICD-10-CM | POA: Diagnosis not present

## 2020-10-28 DIAGNOSIS — M810 Age-related osteoporosis without current pathological fracture: Secondary | ICD-10-CM | POA: Diagnosis not present

## 2020-11-03 DIAGNOSIS — Z03818 Encounter for observation for suspected exposure to other biological agents ruled out: Secondary | ICD-10-CM | POA: Diagnosis not present

## 2020-11-04 DIAGNOSIS — Z741 Need for assistance with personal care: Secondary | ICD-10-CM | POA: Diagnosis not present

## 2020-11-04 DIAGNOSIS — N1832 Chronic kidney disease, stage 3b: Secondary | ICD-10-CM | POA: Diagnosis not present

## 2020-11-04 DIAGNOSIS — M24561 Contracture, right knee: Secondary | ICD-10-CM | POA: Diagnosis not present

## 2020-11-04 DIAGNOSIS — F0281 Dementia in other diseases classified elsewhere with behavioral disturbance: Secondary | ICD-10-CM | POA: Diagnosis not present

## 2020-11-04 DIAGNOSIS — S52125D Nondisplaced fracture of head of left radius, subsequent encounter for closed fracture with routine healing: Secondary | ICD-10-CM | POA: Diagnosis not present

## 2020-11-04 DIAGNOSIS — L8996 Pressure-induced deep tissue damage of unspecified site: Secondary | ICD-10-CM | POA: Diagnosis not present

## 2020-11-04 DIAGNOSIS — R1312 Dysphagia, oropharyngeal phase: Secondary | ICD-10-CM | POA: Diagnosis not present

## 2020-11-04 DIAGNOSIS — M62461 Contracture of muscle, right lower leg: Secondary | ICD-10-CM | POA: Diagnosis not present

## 2020-11-04 DIAGNOSIS — I10 Essential (primary) hypertension: Secondary | ICD-10-CM | POA: Diagnosis not present

## 2020-11-04 DIAGNOSIS — S42294D Other nondisplaced fracture of upper end of right humerus, subsequent encounter for fracture with routine healing: Secondary | ICD-10-CM | POA: Diagnosis not present

## 2020-11-04 DIAGNOSIS — R55 Syncope and collapse: Secondary | ICD-10-CM | POA: Diagnosis not present

## 2020-11-04 DIAGNOSIS — M810 Age-related osteoporosis without current pathological fracture: Secondary | ICD-10-CM | POA: Diagnosis not present

## 2020-11-04 DIAGNOSIS — E039 Hypothyroidism, unspecified: Secondary | ICD-10-CM | POA: Diagnosis not present

## 2020-11-04 DIAGNOSIS — G309 Alzheimer's disease, unspecified: Secondary | ICD-10-CM | POA: Diagnosis not present

## 2020-11-04 DIAGNOSIS — E440 Moderate protein-calorie malnutrition: Secondary | ICD-10-CM | POA: Diagnosis not present

## 2020-11-04 DIAGNOSIS — I214 Non-ST elevation (NSTEMI) myocardial infarction: Secondary | ICD-10-CM | POA: Diagnosis not present

## 2020-11-04 DIAGNOSIS — M6281 Muscle weakness (generalized): Secondary | ICD-10-CM | POA: Diagnosis not present

## 2020-11-04 DIAGNOSIS — S62102S Fracture of unspecified carpal bone, left wrist, sequela: Secondary | ICD-10-CM | POA: Diagnosis not present

## 2020-11-04 DIAGNOSIS — F339 Major depressive disorder, recurrent, unspecified: Secondary | ICD-10-CM | POA: Diagnosis not present

## 2020-11-04 DIAGNOSIS — S52124S Nondisplaced fracture of head of right radius, sequela: Secondary | ICD-10-CM | POA: Diagnosis not present

## 2020-11-04 DIAGNOSIS — R634 Abnormal weight loss: Secondary | ICD-10-CM | POA: Diagnosis not present

## 2020-11-04 DIAGNOSIS — R278 Other lack of coordination: Secondary | ICD-10-CM | POA: Diagnosis not present

## 2020-11-05 DIAGNOSIS — I10 Essential (primary) hypertension: Secondary | ICD-10-CM | POA: Diagnosis not present

## 2020-11-05 DIAGNOSIS — F0281 Dementia in other diseases classified elsewhere with behavioral disturbance: Secondary | ICD-10-CM | POA: Diagnosis not present

## 2020-11-05 DIAGNOSIS — N1832 Chronic kidney disease, stage 3b: Secondary | ICD-10-CM | POA: Diagnosis not present

## 2020-11-05 DIAGNOSIS — G309 Alzheimer's disease, unspecified: Secondary | ICD-10-CM | POA: Diagnosis not present

## 2020-11-05 DIAGNOSIS — E440 Moderate protein-calorie malnutrition: Secondary | ICD-10-CM | POA: Diagnosis not present

## 2020-11-05 DIAGNOSIS — R1312 Dysphagia, oropharyngeal phase: Secondary | ICD-10-CM | POA: Diagnosis not present

## 2020-11-06 DIAGNOSIS — E440 Moderate protein-calorie malnutrition: Secondary | ICD-10-CM | POA: Diagnosis not present

## 2020-11-06 DIAGNOSIS — I10 Essential (primary) hypertension: Secondary | ICD-10-CM | POA: Diagnosis not present

## 2020-11-06 DIAGNOSIS — G309 Alzheimer's disease, unspecified: Secondary | ICD-10-CM | POA: Diagnosis not present

## 2020-11-06 DIAGNOSIS — N1832 Chronic kidney disease, stage 3b: Secondary | ICD-10-CM | POA: Diagnosis not present

## 2020-11-06 DIAGNOSIS — F0281 Dementia in other diseases classified elsewhere with behavioral disturbance: Secondary | ICD-10-CM | POA: Diagnosis not present

## 2020-11-06 DIAGNOSIS — R1312 Dysphagia, oropharyngeal phase: Secondary | ICD-10-CM | POA: Diagnosis not present

## 2020-11-07 DIAGNOSIS — N1832 Chronic kidney disease, stage 3b: Secondary | ICD-10-CM | POA: Diagnosis not present

## 2020-11-07 DIAGNOSIS — E440 Moderate protein-calorie malnutrition: Secondary | ICD-10-CM | POA: Diagnosis not present

## 2020-11-07 DIAGNOSIS — F0281 Dementia in other diseases classified elsewhere with behavioral disturbance: Secondary | ICD-10-CM | POA: Diagnosis not present

## 2020-11-07 DIAGNOSIS — I10 Essential (primary) hypertension: Secondary | ICD-10-CM | POA: Diagnosis not present

## 2020-11-07 DIAGNOSIS — R1312 Dysphagia, oropharyngeal phase: Secondary | ICD-10-CM | POA: Diagnosis not present

## 2020-11-07 DIAGNOSIS — G309 Alzheimer's disease, unspecified: Secondary | ICD-10-CM | POA: Diagnosis not present

## 2020-11-08 DIAGNOSIS — G309 Alzheimer's disease, unspecified: Secondary | ICD-10-CM | POA: Diagnosis not present

## 2020-11-08 DIAGNOSIS — I10 Essential (primary) hypertension: Secondary | ICD-10-CM | POA: Diagnosis not present

## 2020-11-08 DIAGNOSIS — F0281 Dementia in other diseases classified elsewhere with behavioral disturbance: Secondary | ICD-10-CM | POA: Diagnosis not present

## 2020-11-08 DIAGNOSIS — R1312 Dysphagia, oropharyngeal phase: Secondary | ICD-10-CM | POA: Diagnosis not present

## 2020-11-08 DIAGNOSIS — E440 Moderate protein-calorie malnutrition: Secondary | ICD-10-CM | POA: Diagnosis not present

## 2020-11-08 DIAGNOSIS — N1832 Chronic kidney disease, stage 3b: Secondary | ICD-10-CM | POA: Diagnosis not present

## 2020-11-09 DIAGNOSIS — Z03818 Encounter for observation for suspected exposure to other biological agents ruled out: Secondary | ICD-10-CM | POA: Diagnosis not present

## 2020-11-11 DIAGNOSIS — E440 Moderate protein-calorie malnutrition: Secondary | ICD-10-CM | POA: Diagnosis not present

## 2020-11-11 DIAGNOSIS — R1312 Dysphagia, oropharyngeal phase: Secondary | ICD-10-CM | POA: Diagnosis not present

## 2020-11-11 DIAGNOSIS — M810 Age-related osteoporosis without current pathological fracture: Secondary | ICD-10-CM | POA: Diagnosis not present

## 2020-11-11 DIAGNOSIS — M6281 Muscle weakness (generalized): Secondary | ICD-10-CM | POA: Diagnosis not present

## 2020-11-11 DIAGNOSIS — I10 Essential (primary) hypertension: Secondary | ICD-10-CM | POA: Diagnosis not present

## 2020-11-11 DIAGNOSIS — G309 Alzheimer's disease, unspecified: Secondary | ICD-10-CM | POA: Diagnosis not present

## 2020-11-11 DIAGNOSIS — F0281 Dementia in other diseases classified elsewhere with behavioral disturbance: Secondary | ICD-10-CM | POA: Diagnosis not present

## 2020-11-11 DIAGNOSIS — L8996 Pressure-induced deep tissue damage of unspecified site: Secondary | ICD-10-CM | POA: Diagnosis not present

## 2020-11-11 DIAGNOSIS — N1832 Chronic kidney disease, stage 3b: Secondary | ICD-10-CM | POA: Diagnosis not present

## 2020-11-12 DIAGNOSIS — I10 Essential (primary) hypertension: Secondary | ICD-10-CM | POA: Diagnosis not present

## 2020-11-12 DIAGNOSIS — R1312 Dysphagia, oropharyngeal phase: Secondary | ICD-10-CM | POA: Diagnosis not present

## 2020-11-12 DIAGNOSIS — E440 Moderate protein-calorie malnutrition: Secondary | ICD-10-CM | POA: Diagnosis not present

## 2020-11-12 DIAGNOSIS — N1832 Chronic kidney disease, stage 3b: Secondary | ICD-10-CM | POA: Diagnosis not present

## 2020-11-12 DIAGNOSIS — F0281 Dementia in other diseases classified elsewhere with behavioral disturbance: Secondary | ICD-10-CM | POA: Diagnosis not present

## 2020-11-12 DIAGNOSIS — G309 Alzheimer's disease, unspecified: Secondary | ICD-10-CM | POA: Diagnosis not present

## 2020-11-15 DIAGNOSIS — F0281 Dementia in other diseases classified elsewhere with behavioral disturbance: Secondary | ICD-10-CM | POA: Diagnosis not present

## 2020-11-15 DIAGNOSIS — G309 Alzheimer's disease, unspecified: Secondary | ICD-10-CM | POA: Diagnosis not present

## 2020-11-15 DIAGNOSIS — N1832 Chronic kidney disease, stage 3b: Secondary | ICD-10-CM | POA: Diagnosis not present

## 2020-11-15 DIAGNOSIS — R1312 Dysphagia, oropharyngeal phase: Secondary | ICD-10-CM | POA: Diagnosis not present

## 2020-11-15 DIAGNOSIS — E440 Moderate protein-calorie malnutrition: Secondary | ICD-10-CM | POA: Diagnosis not present

## 2020-11-15 DIAGNOSIS — I10 Essential (primary) hypertension: Secondary | ICD-10-CM | POA: Diagnosis not present

## 2020-11-16 DIAGNOSIS — N1832 Chronic kidney disease, stage 3b: Secondary | ICD-10-CM | POA: Diagnosis not present

## 2020-11-16 DIAGNOSIS — E440 Moderate protein-calorie malnutrition: Secondary | ICD-10-CM | POA: Diagnosis not present

## 2020-11-16 DIAGNOSIS — F0281 Dementia in other diseases classified elsewhere with behavioral disturbance: Secondary | ICD-10-CM | POA: Diagnosis not present

## 2020-11-16 DIAGNOSIS — R1312 Dysphagia, oropharyngeal phase: Secondary | ICD-10-CM | POA: Diagnosis not present

## 2020-11-16 DIAGNOSIS — G309 Alzheimer's disease, unspecified: Secondary | ICD-10-CM | POA: Diagnosis not present

## 2020-11-16 DIAGNOSIS — I10 Essential (primary) hypertension: Secondary | ICD-10-CM | POA: Diagnosis not present

## 2020-11-17 DIAGNOSIS — G309 Alzheimer's disease, unspecified: Secondary | ICD-10-CM | POA: Diagnosis not present

## 2020-11-17 DIAGNOSIS — N1832 Chronic kidney disease, stage 3b: Secondary | ICD-10-CM | POA: Diagnosis not present

## 2020-11-17 DIAGNOSIS — R1312 Dysphagia, oropharyngeal phase: Secondary | ICD-10-CM | POA: Diagnosis not present

## 2020-11-17 DIAGNOSIS — I10 Essential (primary) hypertension: Secondary | ICD-10-CM | POA: Diagnosis not present

## 2020-11-17 DIAGNOSIS — E440 Moderate protein-calorie malnutrition: Secondary | ICD-10-CM | POA: Diagnosis not present

## 2020-11-17 DIAGNOSIS — F0281 Dementia in other diseases classified elsewhere with behavioral disturbance: Secondary | ICD-10-CM | POA: Diagnosis not present

## 2020-11-18 DIAGNOSIS — M6281 Muscle weakness (generalized): Secondary | ICD-10-CM | POA: Diagnosis not present

## 2020-11-18 DIAGNOSIS — Z03818 Encounter for observation for suspected exposure to other biological agents ruled out: Secondary | ICD-10-CM | POA: Diagnosis not present

## 2020-11-18 DIAGNOSIS — L8996 Pressure-induced deep tissue damage of unspecified site: Secondary | ICD-10-CM | POA: Diagnosis not present

## 2020-11-18 DIAGNOSIS — M810 Age-related osteoporosis without current pathological fracture: Secondary | ICD-10-CM | POA: Diagnosis not present

## 2020-11-18 DIAGNOSIS — N1832 Chronic kidney disease, stage 3b: Secondary | ICD-10-CM | POA: Diagnosis not present

## 2020-11-18 DIAGNOSIS — E440 Moderate protein-calorie malnutrition: Secondary | ICD-10-CM | POA: Diagnosis not present

## 2020-11-18 DIAGNOSIS — R1312 Dysphagia, oropharyngeal phase: Secondary | ICD-10-CM | POA: Diagnosis not present

## 2020-11-18 DIAGNOSIS — F0281 Dementia in other diseases classified elsewhere with behavioral disturbance: Secondary | ICD-10-CM | POA: Diagnosis not present

## 2020-11-18 DIAGNOSIS — G309 Alzheimer's disease, unspecified: Secondary | ICD-10-CM | POA: Diagnosis not present

## 2020-11-18 DIAGNOSIS — I10 Essential (primary) hypertension: Secondary | ICD-10-CM | POA: Diagnosis not present

## 2020-11-19 DIAGNOSIS — R1312 Dysphagia, oropharyngeal phase: Secondary | ICD-10-CM | POA: Diagnosis not present

## 2020-11-19 DIAGNOSIS — F0281 Dementia in other diseases classified elsewhere with behavioral disturbance: Secondary | ICD-10-CM | POA: Diagnosis not present

## 2020-11-19 DIAGNOSIS — E440 Moderate protein-calorie malnutrition: Secondary | ICD-10-CM | POA: Diagnosis not present

## 2020-11-19 DIAGNOSIS — I10 Essential (primary) hypertension: Secondary | ICD-10-CM | POA: Diagnosis not present

## 2020-11-19 DIAGNOSIS — G309 Alzheimer's disease, unspecified: Secondary | ICD-10-CM | POA: Diagnosis not present

## 2020-11-19 DIAGNOSIS — N1832 Chronic kidney disease, stage 3b: Secondary | ICD-10-CM | POA: Diagnosis not present

## 2023-11-22 DEATH — deceased
